# Patient Record
Sex: Female | Born: 1970 | Race: White | Hispanic: No | Marital: Married | Smoking: Former smoker
Health system: Southern US, Community
[De-identification: ages and names within clinical notes are randomized; demographics above are authoritative.]

## PROBLEM LIST (undated history)

## (undated) DIAGNOSIS — R011 Cardiac murmur, unspecified: Secondary | ICD-10-CM

## (undated) DIAGNOSIS — R519 Headache, unspecified: Secondary | ICD-10-CM

## (undated) DIAGNOSIS — F411 Generalized anxiety disorder: Secondary | ICD-10-CM

## (undated) DIAGNOSIS — K5790 Diverticulosis of intestine, part unspecified, without perforation or abscess without bleeding: Secondary | ICD-10-CM

## (undated) DIAGNOSIS — G43909 Migraine, unspecified, not intractable, without status migrainosus: Secondary | ICD-10-CM

## (undated) DIAGNOSIS — F32A Depression, unspecified: Secondary | ICD-10-CM

## (undated) DIAGNOSIS — K219 Gastro-esophageal reflux disease without esophagitis: Secondary | ICD-10-CM

## (undated) DIAGNOSIS — I509 Heart failure, unspecified: Secondary | ICD-10-CM

## (undated) DIAGNOSIS — K635 Polyp of colon: Secondary | ICD-10-CM

## (undated) DIAGNOSIS — R51 Headache: Secondary | ICD-10-CM

## (undated) DIAGNOSIS — F329 Major depressive disorder, single episode, unspecified: Secondary | ICD-10-CM

## (undated) DIAGNOSIS — T7840XA Allergy, unspecified, initial encounter: Secondary | ICD-10-CM

## (undated) DIAGNOSIS — K8020 Calculus of gallbladder without cholecystitis without obstruction: Secondary | ICD-10-CM

## (undated) DIAGNOSIS — G8929 Other chronic pain: Secondary | ICD-10-CM

## (undated) DIAGNOSIS — E669 Obesity, unspecified: Secondary | ICD-10-CM

## (undated) DIAGNOSIS — E785 Hyperlipidemia, unspecified: Secondary | ICD-10-CM

## (undated) DIAGNOSIS — D649 Anemia, unspecified: Secondary | ICD-10-CM

## (undated) DIAGNOSIS — M1990 Unspecified osteoarthritis, unspecified site: Secondary | ICD-10-CM

## (undated) HISTORY — PX: COLONOSCOPY: SHX174

## (undated) HISTORY — DX: Headache, unspecified: G89.29

## (undated) HISTORY — DX: Cardiac murmur, unspecified: R01.1

## (undated) HISTORY — DX: Gastro-esophageal reflux disease without esophagitis: K21.9

## (undated) HISTORY — DX: Generalized anxiety disorder: F41.1

## (undated) HISTORY — DX: Polyp of colon: K63.5

## (undated) HISTORY — DX: Anemia, unspecified: D64.9

## (undated) HISTORY — DX: Headache: R51

## (undated) HISTORY — DX: Diverticulosis of intestine, part unspecified, without perforation or abscess without bleeding: K57.90

## (undated) HISTORY — DX: Obesity, unspecified: E66.9

## (undated) HISTORY — DX: Major depressive disorder, single episode, unspecified: F32.9

## (undated) HISTORY — DX: Calculus of gallbladder without cholecystitis without obstruction: K80.20

## (undated) HISTORY — DX: Hyperlipidemia, unspecified: E78.5

## (undated) HISTORY — DX: Allergy, unspecified, initial encounter: T78.40XA

## (undated) HISTORY — DX: Depression, unspecified: F32.A

## (undated) HISTORY — DX: Headache, unspecified: R51.9

## (undated) HISTORY — DX: Unspecified osteoarthritis, unspecified site: M19.90

## (undated) HISTORY — PX: POLYPECTOMY: SHX149

---

## 1990-12-11 HISTORY — PX: CHOLECYSTECTOMY: SHX55

## 1991-12-12 HISTORY — PX: RHINOPLASTY: SUR1284

## 1993-12-11 HISTORY — PX: TUBAL LIGATION: SHX77

## 2003-08-22 ENCOUNTER — Emergency Department (HOSPITAL_COMMUNITY): Admission: EM | Admit: 2003-08-22 | Discharge: 2003-08-22 | Payer: Self-pay | Admitting: *Deleted

## 2004-09-14 ENCOUNTER — Emergency Department (HOSPITAL_COMMUNITY): Admission: EM | Admit: 2004-09-14 | Discharge: 2004-09-14 | Payer: Self-pay | Admitting: Emergency Medicine

## 2005-03-07 ENCOUNTER — Ambulatory Visit: Admission: RE | Admit: 2005-03-07 | Discharge: 2005-03-07 | Payer: Self-pay | Admitting: Family Medicine

## 2005-03-10 ENCOUNTER — Ambulatory Visit: Payer: Self-pay | Admitting: Pulmonary Disease

## 2006-12-27 ENCOUNTER — Emergency Department (HOSPITAL_COMMUNITY): Admission: EM | Admit: 2006-12-27 | Discharge: 2006-12-27 | Payer: Self-pay | Admitting: Emergency Medicine

## 2007-04-15 ENCOUNTER — Emergency Department (HOSPITAL_COMMUNITY): Admission: EM | Admit: 2007-04-15 | Discharge: 2007-04-15 | Payer: Self-pay | Admitting: Emergency Medicine

## 2007-07-15 ENCOUNTER — Emergency Department (HOSPITAL_COMMUNITY): Admission: EM | Admit: 2007-07-15 | Discharge: 2007-07-15 | Payer: Self-pay | Admitting: Emergency Medicine

## 2007-12-24 ENCOUNTER — Emergency Department (HOSPITAL_COMMUNITY): Admission: EM | Admit: 2007-12-24 | Discharge: 2007-12-25 | Payer: Self-pay | Admitting: Emergency Medicine

## 2011-02-20 ENCOUNTER — Encounter (INDEPENDENT_AMBULATORY_CARE_PROVIDER_SITE_OTHER): Payer: Self-pay | Admitting: *Deleted

## 2011-02-22 ENCOUNTER — Encounter: Payer: Self-pay | Admitting: Gastroenterology

## 2011-02-22 ENCOUNTER — Ambulatory Visit (INDEPENDENT_AMBULATORY_CARE_PROVIDER_SITE_OTHER): Payer: 59 | Admitting: Gastroenterology

## 2011-02-22 ENCOUNTER — Encounter (INDEPENDENT_AMBULATORY_CARE_PROVIDER_SITE_OTHER): Payer: Self-pay | Admitting: *Deleted

## 2011-02-22 DIAGNOSIS — K5900 Constipation, unspecified: Secondary | ICD-10-CM

## 2011-02-25 ENCOUNTER — Encounter: Payer: Self-pay | Admitting: Nurse Practitioner

## 2011-02-25 DIAGNOSIS — I10 Essential (primary) hypertension: Secondary | ICD-10-CM | POA: Insufficient documentation

## 2011-02-25 DIAGNOSIS — F32A Depression, unspecified: Secondary | ICD-10-CM | POA: Insufficient documentation

## 2011-02-25 DIAGNOSIS — F411 Generalized anxiety disorder: Secondary | ICD-10-CM

## 2011-02-25 DIAGNOSIS — E1165 Type 2 diabetes mellitus with hyperglycemia: Secondary | ICD-10-CM | POA: Insufficient documentation

## 2011-02-25 DIAGNOSIS — E785 Hyperlipidemia, unspecified: Secondary | ICD-10-CM

## 2011-02-25 DIAGNOSIS — F329 Major depressive disorder, single episode, unspecified: Secondary | ICD-10-CM

## 2011-02-28 NOTE — Letter (Signed)
Summary: New Patient letter  [facility]  [address] [place], [place] [address]   Phone: [phone]  Fax: [phone]       02/20/2011 MRN: [id number]  [facility] [address] [place], [place]  [address]  Dear Ms. [facility],  Welcome to the Gastroenterology Division at [facility].    You are scheduled to see Dr. Rob Bunting on February 22, 2011 at 9:00am on the 3rd floor at [facility], [address]  We ask that you try to arrive at our office 15 minutes prior to your appointment time to allow for check-in.  We would like you to complete the enclosed self-administered evaluation form prior to your visit and bring it with you on the day of your appointment.  We will review it with you.  Also, please bring a complete list of all your medications or, if you prefer, bring the medication bottles and we will list them.  Please bring your insurance card so that we may make a copy of it.  If your insurance requires a referral to see a specialist, please bring your referral form from your primary care physician.  Co-payments are due at the time of your visit and may be paid by cash, check or credit card.     Your office visit will consist of a consult with your physician (includes a physical exam), any laboratory testing he/she may order, scheduling of any necessary diagnostic testing (e.g. x-ray, ultrasound, CT-scan), and scheduling of a procedure (e.g. Endoscopy, Colonoscopy) if required.  Please allow enough time on your schedule to allow for any/all of these possibilities.    If you cannot keep your appointment, please call [phone] to cancel or reschedule prior to your appointment date.  This allows [place] the opportunity to schedule an appointment for another patient in need of care.  If you do not cancel or reschedule by 5 p.m. the business day prior to your appointment date, you will be charged a $50.00 late cancellation/no-show fee.     Thank you for choosing [facility] for your medical needs.  We appreciate the opportunity to care for you.  Please visit [place] at our website  to learn more about our practice.                     Sincerely,                                                             The Gastroenterology Division

## 2011-02-28 NOTE — Assessment & Plan Note (Signed)
History of Present Illness Visit Type: Initial Consult Primary GI MD: Rob Bunting MD Primary Provider: Bennie Pierini, NP  Requesting Provider: Ernestina Penna, MD  Chief Complaint: constipation History of Present Illness:      a very pleasant 40 year old woman who has had a lot of contipation for past 6 months.  She had her GB removed in 1990, usually has been pretty loose.  Was going every day, very easily, sometimes twice a day.  Started victoza 8 months ago.   side effects of vicotza  include nausea, diarrhea, vomiting, constipation.  she never sees blood in her stool  she has lost weight recently, about 40 pounds in past year.  she started taking miralax, 1 cap a day, for past 2 weeks.  She says this has clearly helped her constipation.   blood tests done last month show that she  head is normal complete metabolic profile,             Current Medications (verified): 1)  Wellbutrin Xl 300 Mg Xr24h-Tab (Bupropion Hcl) .Marland Kitchen.. 1 By Mouth Once Daily 2)  Clonazepam 0.5 Mg Tabs (Clonazepam) .Marland Kitchen.. 1 By Mouth Once Daily 3)  Pravachol 40 Mg Tabs (Pravastatin Sodium) .Marland Kitchen.. 1 By Mouth Once Daily 4)  Vitamin D (Ergocalciferol) 50000 Unit Caps (Ergocalciferol) .Marland Kitchen.. 1 By Mouth Q Week 5)  Victoza 1.2 .... Injection Once Daily For Diabetes 6)  Miralax  Powd (Polyethylene Glycol 3350) .... Once Daily 7)  Ibuprofen 200 Mg Caps (Ibuprofen) .... 2 Capsule By Mouth Two Times A Day For Headaches 8)  Fioricet 50-325-40 Mg Tabs (Butalbital-Apap-Caffeine) .... As Needed For Headaches 9)  Imitrex 25 Mg Tabs (Sumatriptan Succinate) .... As Needed  Allergies (verified): 1)  ! Jonne Ply  Past History:  Past Medical History: Depression Diabetes Hyperlipidemia Hypertension Chronic Headaches  obesity  Gallstones,   Past Surgical History:  gallbladder removal 1991  Family History:  no colon cancer  Social History:  she is married, she has 2 children, she works as a Chief Operating Officer, she currently smokes cigarettes, she does not drink alcohol.  Review of Systems       Pertinent positive and negative review of systems were noted in the above HPI and GI specific review of systems.  All other review of systems was otherwise negative.   Vital Signs:  Patient profile:   40 year old female Height:      65 inches Weight:      295 pounds BMI:     49.27 BSA:     2.34 Pulse rate:   88 / minute Pulse rhythm:   regular BP sitting:   128 / 74  (left arm) Cuff size:   large  Vitals Entered By: Ok Anis CMA (February 22, 2011 9:12 AM)  Physical Exam  Additional Exam:  Constitutional:  Obese, otherwise generally well appearing Psychiatric: alert and oriented times 3 Eyes: extraocular movements intact Mouth: oropharynx moist, no lesions Neck: supple, no lymphadenopathy Cardiovascular: heart regular rate and rythm Lungs: CTA bilaterally Abdomen: soft, non-tender, non-distended, no obvious ascites, no peritoneal signs, normal bowel sounds Extremities: no lower extremity edema bilaterally Skin: no lesions on visible extremities    Impression & Recommendations:  Problem # 1:   change in bowel habits, constipation  she was started on a medicine that list constipation as a  potential side effect shortly before her constipation began. I suspect that this medicine is possibly contributing to her constipation. MiraLax once daily has significantly improved  matters. We will proceed with colonoscopy at her since convenience to rule out structural causes and she will continue on MiraLax daily  Patient Instructions: 1)  One of your biggest health concerns is your smoking.  You should try your absolute best to stop.  If you need assistence, please contact your PCP or Smoking Cessation Class at [facility] [phone]) or [facility] Quit-Line (1-800-QUIT-NOW). 2)  Stay on miralax, one or two scoops a day. 3)  You will be scheduled to have a colonoscopy. 4)  Seems  like the victoza might be contributing to your constipation. 5)  A copy of this information will be sent to Paulene Floor, NP 6)  The medication list was reviewed and reconciled.  All changed / newly prescribed medications were explained.  A complete medication list was provided to the patient / caregiver. 7)  The medication list was reviewed and reconciled.  All changed / newly prescribed medications were explained.  A complete medication list was provided to the patient / caregiver.  Appended Document: Orders Update/movi    Clinical Lists Changes  Problems: Added new problem of CONSTIPATION (ICD-564.00) Medications: Added new medication of MOVIPREP 100 GM  SOLR (PEG-KCL-NACL-NASULF-NA ASC-C) As per prep instructions. - Signed Rx of MOVIPREP 100 GM  SOLR (PEG-KCL-NACL-NASULF-NA ASC-C) As per prep instructions.;  #1 x 0;  Signed;  Entered by: Chales Abrahams CMA (AAMA);  Authorized by: Rachael Fee MD;  Method used: Electronically to [facility]  [id number] [address] [place] [address], [place], [place]  [address], Ph: [phone] or [phone], Fax: [phone] Orders: Added new Test order of Colonoscopy (Colon) - Signed    Prescriptions: MOVIPREP 100 GM  SOLR (PEG-KCL-NACL-NASULF-NA ASC-C) As per prep instructions.  #1 x 0   Entered by:   Chales Abrahams CMA (AAMA)   Authorized by:   Rachael Fee MD   Signed by:   Chales Abrahams CMA (AAMA) on 02/22/2011   Method used:   Electronically to        [facility]  [id number] (retail)       [address]       [place], [place]  [address]       Ph: [phone] or [phone]       Fax: [phone]   RxID:   [id number]

## 2011-02-28 NOTE — Letter (Signed)
Summary: Diabetic Instructions  [facility]  [address] [place], [place] [address]   Phone: [phone]  Fax: [phone]    [name] 07/23/1971 MRN: [id number]   X   Victoza  DIABETIC MEDICATION INSTRUCTIONS  The day before your procedure:   Take your Victoza as you do normally  The day of your procedure:   Do not take your Victoza   We will check your blood sugar levels during the admission process and again in Recovery before discharging you home  ________________________________________________________________________

## 2011-02-28 NOTE — Letter (Signed)
Summary: [facility] Instructions  [facility]  [address] [place], [place] [address]   Phone: [phone]  Fax: [phone]       Kelli [name]    12-Nov-1971    MRN: [id number]        Procedure Day /Date:03/28/11 TUE     Arrival Time:2 pm     Procedure Time:3 pm     Location of Procedure:                    [facility] (4th Floor)   PREPARATION FOR COLONOSCOPY WITH MOVIPREP   Starting 5 days prior to your procedure4/12/12 do not eat nuts, seeds, popcorn, corn, beans, peas,  salads, or any raw vegetables.  Do not take any fiber supplements (e.g. Metamucil, Citrucel, and Benefiber).  THE DAY BEFORE YOUR PROCEDURE         DATE:03/27/11  DAY: MON  1.  Drink clear liquids the entire day-NO SOLID FOOD  2.  Do not drink anything colored red or purple.  Avoid juices with pulp.  No orange juice.  3.  Drink at least 64 oz. (8 glasses) of fluid/clear liquids during the day to prevent dehydration and help the prep work efficiently.  CLEAR LIQUIDS INCLUDE: Water Jello Ice Popsicles Tea (sugar ok, no milk/cream) Powdered fruit flavored drinks Coffee (sugar ok, no milk/cream) Gatorade Juice: apple, white grape, white cranberry  Lemonade Clear bullion, consomm, broth Carbonated beverages (any kind) Strained chicken noodle soup Hard Candy                             4.  In the morning, mix first dose of MoviPrep solution:    Empty 1 Pouch A and 1 Pouch B into the disposable container    Add lukewarm drinking water to the top line of the container. Mix to dissolve    Refrigerate (mixed solution should be used within 24 hrs)  5.  Begin drinking the prep at 5:00 p.m. The MoviPrep container is divided by 4 marks.   Every 15 minutes drink the solution down to the next mark (approximately 8 oz) until the full liter is complete.   6.  Follow completed prep with 16 oz of clear liquid of your choice (Nothing red or purple).  Continue to drink clear  liquids until bedtime.  7.  Before going to bed, mix second dose of MoviPrep solution:    Empty 1 Pouch A and 1 Pouch B into the disposable container    Add lukewarm drinking water to the top line of the container. Mix to dissolve    Refrigerate  THE DAY OF YOUR PROCEDURE      DATE: 03/28/11 DAY: TUE  Beginning at 10 a.m. (5 hours before procedure):         1. Every 15 minutes, drink the solution down to the next mark (approx 8 oz) until the full liter is complete.  2. Follow completed prep with 16 oz. of clear liquid of your choice.    3. You may drink clear liquids until 1 pm  (2 HOURS BEFORE PROCEDURE).   MEDICATION INSTRUCTIONS  Unless otherwise instructed, you should take regular prescription medications with a small sip of water   as early as possible the morning of your procedure.  Diabetic patients - see separate instructions.           OTHER INSTRUCTIONS  You will need a responsible  adult at least 40 years of age to accompany you and drive you home.   This person must remain in the waiting room during your procedure.  Wear loose fitting clothing that is easily removed.  Leave jewelry and other valuables at home.  However, you may wish to bring a book to read or  an iPod/MP3 player to listen to music as you wait for your procedure to start.  Remove all body piercing jewelry and leave at home.  Total time from sign-in until discharge is approximately 2-3 hours.  You should go home directly after your procedure and rest.  You can resume normal activities the  day after your procedure.  The day of your procedure you should not:   Drive   Make legal decisions   Operate machinery   Drink alcohol   Return to work  You will receive specific instructions about eating, activities and medications before you leave.    The above instructions have been reviewed and explained to me by   _______________________    I fully understand and can verbalize  these instructions _____________________________ Date _________

## 2011-03-27 ENCOUNTER — Encounter: Payer: Self-pay | Admitting: Gastroenterology

## 2011-03-28 ENCOUNTER — Encounter: Payer: Self-pay | Admitting: Gastroenterology

## 2011-03-28 ENCOUNTER — Ambulatory Visit (AMBULATORY_SURGERY_CENTER): Payer: 59 | Admitting: Gastroenterology

## 2011-03-28 VITALS — BP 119/69 | HR 74 | Temp 96.8°F | Resp 29 | Ht 65.0 in | Wt 292.0 lb

## 2011-03-28 DIAGNOSIS — D126 Benign neoplasm of colon, unspecified: Secondary | ICD-10-CM

## 2011-03-28 DIAGNOSIS — K5730 Diverticulosis of large intestine without perforation or abscess without bleeding: Secondary | ICD-10-CM

## 2011-03-28 DIAGNOSIS — K635 Polyp of colon: Secondary | ICD-10-CM

## 2011-03-28 DIAGNOSIS — K5900 Constipation, unspecified: Secondary | ICD-10-CM

## 2011-03-28 HISTORY — DX: Polyp of colon: K63.5

## 2011-03-28 LAB — GLUCOSE, CAPILLARY: Glucose-Capillary: 161 mg/dL — ABNORMAL HIGH (ref 70–99)

## 2011-03-28 MED ORDER — SODIUM CHLORIDE 0.9 % IV SOLN: 500.0000 mL | INTRAVENOUS | Status: DC

## 2011-03-28 NOTE — Patient Instructions (Signed)
1 polyp  Mild diverticulosis See green and blue sheets for additional d/c instructions

## 2011-03-29 ENCOUNTER — Telehealth: Payer: Self-pay | Admitting: *Deleted

## 2011-03-29 NOTE — Telephone Encounter (Signed)
Home number had message that stated number not reachable and cell nu not in service

## 2011-04-25 ENCOUNTER — Encounter: Payer: Self-pay | Admitting: Family Medicine

## 2011-04-26 ENCOUNTER — Encounter: Payer: Self-pay | Admitting: Family Medicine

## 2011-04-28 NOTE — Procedures (Signed)
Kelli [name], Kelli [name]            ACCOUNT NO.:  [id number]   MEDICAL RECORD NO.:  [id number]          PATIENT TYPE:  OUT   LOCATION:  SLEEP LAB                     FACILITY:  [facility]   PHYSICIAN:  Marcelyn Bruins, M.D. [facility] DATE OF BIRTH:  08-27-1971   DATE OF STUDY:  03/07/2005                              NOCTURNAL POLYSOMNOGRAM   REFERRING PHYSICIAN:  Dr. Lilyan Punt.   INDICATIONS FOR THE STUDY:  Hypersomnia with sleep apnea.   SLEEP ARCHITECTURE:  The patient had a total sleep time of 341 minutes with  a sleep efficiency of 89%. There was decreased slow wave sleep but adequate  REM. Sleep onset latency was mildly prolonged at 36 minutes and REM onset  was normal at 109 minutes.   IMPRESSION:  1.  Mild obstructive sleep apnea/hypopnea syndrome with a respiratory      disturbance index of 10 events per hour and oxygen desaturation as low      as 83%. The events were not positional. Even though the patient's      respiratory disturbance index was not that high, the events occurred      primarily during REM and therefore may result in greater symptoms than      usually is seen in those with mild obstructive sleep apnea. Clinical      correlation is suggested. The patient did not meet split night criteria      due to most of her events occurring after midnight.  2.  Mild to moderate snoring noted throughout.  3.  No clinically significant cardiac arrhythmias.      KC/MEDQ  D:  03/10/2005 09:27:47  T:  03/10/2005 09:47:25  Job:  [id number]

## 2011-08-30 LAB — GC/CHLAMYDIA PROBE AMP, GENITAL
Chlamydia, DNA Probe: NEGATIVE
GC Probe Amp, Genital: NEGATIVE

## 2011-08-30 LAB — URINALYSIS, ROUTINE W REFLEX MICROSCOPIC
Glucose, UA: NEGATIVE
Hgb urine dipstick: NEGATIVE
Specific Gravity, Urine: >1.03 — ABNORMAL HIGH
pH: 5

## 2011-08-30 LAB — WET PREP, GENITAL: Yeast Wet Prep HPF POC: NONE SEEN

## 2013-06-04 ENCOUNTER — Encounter: Payer: Self-pay | Admitting: Obstetrics & Gynecology

## 2013-06-05 ENCOUNTER — Ambulatory Visit (INDEPENDENT_AMBULATORY_CARE_PROVIDER_SITE_OTHER): Payer: 59 | Admitting: Obstetrics & Gynecology

## 2013-06-05 ENCOUNTER — Encounter: Payer: Self-pay | Admitting: Obstetrics & Gynecology

## 2013-06-05 VITALS — BP 122/78 | HR 60 | Resp 16 | Ht 64.75 in | Wt 279.8 lb

## 2013-06-05 DIAGNOSIS — Z01419 Encounter for gynecological examination (general) (routine) without abnormal findings: Secondary | ICD-10-CM

## 2013-06-05 MED ORDER — NORETHIN-ETH ESTRAD-FE BIPHAS 1 MG-10 MCG / 10 MCG PO TABS: 1.0000 | ORAL_TABLET | Freq: Every day | ORAL | Status: DC

## 2013-06-05 NOTE — Patient Instructions (Signed)

## 2013-06-05 NOTE — Progress Notes (Addendum)
42 y.o. G3P2 MarriedCaucasianF here for annual exam.  Doing well.  Has "new" 100 month old granddaughter--Kelli [name].  Cycles are regular and some better.  Decided against the Mirena.  Quit smoking in February.  Would like to try pills now that she is not smoking.  Labs in April were all good.  Hb A1c was 6.7 per patient.  Patient's last menstrual period was 05/25/2013.          Sexually active: yes  The current method of family planning is tubal ligation.    Exercising: no  not regularly Smoker:  No quit in 2/14  Health Maintenance: Pap:  05/15/12 WNL/negative HR HPV History of abnormal Pap:  yes MMG:  [facility].  (got outside records--MMG was 6/12) Colonoscopy:  2012 repeat in 5 years BMD:   none TDaP:  05/15/12 Screening Labs: PCP, Hb today: PCP, Urine today: PCP   reports that she has quit smoking. She uses smokeless tobacco. She reports that  drinks alcohol. She reports that she does not use illicit drugs.  Past Medical History  Diagnosis Date  . Depression   . Diabetes mellitus   . Hyperlipidemia   . Hypertension   . Chronic headache   . Obesity   . Gallstones   . GAD (generalized anxiety disorder)   . Diverticulosis   . Colon polyps 03/28/11    Past Surgical History  Procedure Laterality Date  . Cholecystectomy  1992  . Rhinoplasty  1993  . Cesarean section  1991, 1995  . Tubal ligation Bilateral 1995    Current Outpatient Prescriptions  Medication Sig Dispense Refill  . atorvastatin (LIPITOR) 40 MG tablet 40 mg daily.      Marland Kitchen buPROPion (WELLBUTRIN XL) 300 MG 24 hr tablet Take 300 mg by mouth daily.        . Cholecalciferol (VITAMIN D PO) Take by mouth daily.      . clonazePAM (KLONOPIN) 0.5 MG tablet Take 0.5 mg by mouth daily.        . cyclobenzaprine (FLEXERIL) 5 MG tablet 5 mg daily.      Marland Kitchen ibuprofen (ADVIL,MOTRIN) 200 MG tablet Take 800 mg by mouth as needed.       . Liraglutide (VICTOZA) 18 MG/3ML SOLN Inject 1.2 mg into the skin daily.        .  Loratadine-Pseudoephedrine (CLARITIN-D 24 HOUR PO) Take by mouth.      . Omega-3 Fatty Acids (FISH OIL PO) Take by mouth.      . polyethylene glycol (MIRALAX / GLYCOLAX) packet Take 17 g by mouth daily.        . SUMAtriptan (IMITREX) 25 MG tablet Take 25 mg by mouth as needed.        . Topiramate (TOPAMAX PO) Take 50 mg by mouth 2 (two) times daily.        No current facility-administered medications for this visit.    Family History  Problem Relation Age of Onset  . Diabetes Maternal Grandmother     ROS:  Pertinent items are noted in HPI.  Otherwise, a comprehensive ROS was negative.  Exam:   BP 122/78  Pulse 60  Resp 16  Ht 5' 4.75" (1.645 m)  Wt 279 lb 12.8 oz (126.916 kg)  BMI 46.9 kg/m2  LMP 05/25/2013  Weight change: +19lb   Height: 5' 4.75" (164.5 cm)  Ht Readings from Last 3 Encounters:  06/05/13 5' 4.75" (1.645 m)  03/28/11 5\' 5"  (1.651 m)  03/27/11 5\' 5"  (1.651  m)   General appearance: alert, cooperative and appears stated age Head: Normocephalic, without obvious abnormality, atraumatic Neck: no adenopathy, supple, symmetrical, trachea midline and thyroid normal to inspection and palpation Lungs: clear to auscultation bilaterally Breasts: normal appearance, no masses or tenderness Heart: regular rate and rhythm Abdomen: soft, non-tender; bowel sounds normal; no masses,  no organomegaly Extremities: extremities normal, atraumatic, no cyanosis or edema Skin: Skin color, texture, turgor normal. No rashes or lesions Lymph nodes: Cervical, supraclavicular, and axillary nodes normal. No abnormal inguinal nodes palpated Neurologic: Grossly normal   Pelvic: External genitalia:  no lesions              Urethra:  normal appearing urethra with no masses, tenderness or lesions              Bartholins and Skenes: normal                 Vagina: normal appearing vagina with normal color and discharge, no lesions, inferior edge of left lower vulvar with healing furuncle               Cervix: no lesions              Pap taken: no Bimanual Exam:  Uterus:  normal size, contour, position, consistency, mobility, non-tender              Adnexa: normal adnexa and no mass, fullness, tenderness               Rectovaginal: Confirms               Anus:  normal sphincter tone, no lesions  A:  Well Woman with normal exam Menorrhagia with neg u/s and endometrial biopsy 6/11 H/O cesarean section x 2 with BTL Diabetes Morbid obesity  P:   Mammogram yearly.  Will get release to get MMG from [facility]. pap smear with neg HR HPV one year ago.  No pap today. Labs every 3 months at [facility] medicine Trial of Lo lo estrin.  Risks of elevated BP, headache, DVT/PE, stroke, DUB, Nausea, Headache all discussed with patient. return annually or prn  An After Visit Summary was printed and given to the patient.

## 2013-06-12 ENCOUNTER — Telehealth: Payer: Self-pay

## 2013-06-12 NOTE — Telephone Encounter (Signed)
Message copied by Elisha Headland on Thu Jun 12, 2013  4:43 PM ------      Message from: Jerene Bears      Created: Mon Jun 09, 2013  9:45 PM      Regarding: mmg from [facility],      This is a patient we recently saw for AEX.  I got her MMG from [facility] and it is from 6/12.  Can you please call patient and inform her of this and the importance of have an up to date MMG?  Thanks.            MSM ------

## 2013-06-12 NOTE — Telephone Encounter (Signed)
Lmtcb//kn 

## 2013-06-25 NOTE — Telephone Encounter (Signed)
7/16 lmtcb//kn 

## 2013-07-07 NOTE — Telephone Encounter (Signed)
7/28 lmtcb/kn 

## 2013-09-05 ENCOUNTER — Ambulatory Visit: Payer: 59 | Admitting: Obstetrics & Gynecology

## 2013-09-29 ENCOUNTER — Other Ambulatory Visit: Payer: Self-pay

## 2013-09-29 DIAGNOSIS — Z1231 Encounter for screening mammogram for malignant neoplasm of breast: Secondary | ICD-10-CM

## 2013-10-15 ENCOUNTER — Ambulatory Visit
Admission: RE | Admit: 2013-10-15 | Discharge: 2013-10-15 | Disposition: A | Discharge status: Home / Self Care | Payer: 59 | Source: Ambulatory Visit

## 2013-10-15 DIAGNOSIS — Z1231 Encounter for screening mammogram for malignant neoplasm of breast: Secondary | ICD-10-CM

## 2014-05-28 NOTE — Telephone Encounter (Signed)
Patient has had her MMG done.//kn

## 2014-08-07 ENCOUNTER — Ambulatory Visit (INDEPENDENT_AMBULATORY_CARE_PROVIDER_SITE_OTHER): Payer: 59 | Admitting: Obstetrics & Gynecology

## 2014-08-07 ENCOUNTER — Encounter: Payer: Self-pay | Admitting: Obstetrics & Gynecology

## 2014-08-07 VITALS — BP 118/80 | HR 68 | Resp 18 | Ht 64.5 in | Wt 264.0 lb

## 2014-08-07 DIAGNOSIS — Z124 Encounter for screening for malignant neoplasm of cervix: Secondary | ICD-10-CM

## 2014-08-07 DIAGNOSIS — E1110 Type 2 diabetes mellitus with ketoacidosis without coma: Secondary | ICD-10-CM

## 2014-08-07 DIAGNOSIS — E1310 Other specified diabetes mellitus with ketoacidosis without coma: Secondary | ICD-10-CM

## 2014-08-07 DIAGNOSIS — Z0000 Encounter for general adult medical examination without abnormal findings: Secondary | ICD-10-CM

## 2014-08-07 DIAGNOSIS — D500 Iron deficiency anemia secondary to blood loss (chronic): Secondary | ICD-10-CM

## 2014-08-07 DIAGNOSIS — Z01419 Encounter for gynecological examination (general) (routine) without abnormal findings: Secondary | ICD-10-CM

## 2014-08-07 LAB — POCT URINALYSIS DIPSTICK
BILIRUBIN UA: NEGATIVE
Blood, UA: NEGATIVE
Glucose, UA: 100
Ketones, UA: NEGATIVE
LEUKOCYTES UA: NEGATIVE
NITRITE UA: NEGATIVE
PH UA: 7
PROTEIN UA: NEGATIVE
UROBILINOGEN UA: NEGATIVE

## 2014-08-07 LAB — IRON: IRON: 67 ug/dL (ref 42–145)

## 2014-08-07 LAB — FERRITIN: FERRITIN: 132 ng/mL (ref 10–291)

## 2014-08-07 LAB — COMPREHENSIVE METABOLIC PANEL
ALK PHOS: 106 U/L (ref 39–117)
ALT: 13 U/L (ref 0–35)
AST: 11 U/L (ref 0–37)
Albumin: 4 g/dL (ref 3.5–5.2)
BILIRUBIN TOTAL: 0.5 mg/dL (ref 0.2–1.2)
BUN: 11 mg/dL (ref 6–23)
CO2: 29 mEq/L (ref 19–32)
CREATININE: 0.63 mg/dL (ref 0.50–1.10)
Calcium: 9.4 mg/dL (ref 8.4–10.5)
Chloride: 103 mEq/L (ref 96–112)
Glucose, Bld: 169 mg/dL — ABNORMAL HIGH (ref 70–99)
POTASSIUM: 4.2 meq/L (ref 3.5–5.3)
Sodium: 139 mEq/L (ref 135–145)
Total Protein: 6.4 g/dL (ref 6.0–8.3)

## 2014-08-07 LAB — LIPID PANEL
CHOLESTEROL: 122 mg/dL (ref 0–200)
HDL: 50 mg/dL (ref >39)
LDL Cholesterol: 59 mg/dL (ref 0–99)
TRIGLYCERIDES: 66 mg/dL (ref <150)
Total CHOL/HDL Ratio: 2.4 Ratio
VLDL: 13 mg/dL (ref 0–40)

## 2014-08-07 LAB — HEMOGLOBIN, FINGERSTICK: Hemoglobin, fingerstick: 10.8 g/dL — ABNORMAL LOW (ref 12.0–16.0)

## 2014-08-07 LAB — HEMOGLOBIN A1C
Hgb A1c MFr Bld: 8.4 % — ABNORMAL HIGH (ref <5.7)
MEAN PLASMA GLUCOSE: 194 mg/dL — AB (ref <117)

## 2014-08-07 NOTE — Progress Notes (Signed)
43 y.o. G3P2 MarriedCaucasianF here for annual exam.  Reports cycles are more normal.  Off pill as she was concerned about weight gain.  Flow is about 5 days with first two being the heavier days.  Cycles were more like 7 days with heavy flow for 4-5 days.    Smoking again.  Knows I don't want her to be on an estrogen pill if she is smoking.   Mother injured 01/10/14.  Pt left job to be able to help mother.  Didn't give sufficient notice to leave job and now states they will not rehire her due to this.  Working again but not a job she loves.  Still grateful she is working again.     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    Patient's last menstrual period was 08/01/2014.          Sexually active: Yes.    The current method of family planning is tubal ligation.    Exercising: Yes.    Walking 3x weekly Smoker:  Yes, <1 pack/day  Health Maintenance: Pap:  05/2012 Neg. HR HPV neg History of abnormal Pap:  no MMG: 10/2013 BIRADS1: Neg Colonoscopy: 2012 - Polyps - Repeat in 5 years  BMD:   N/A TDaP:  05/2012 Screening Labs: today, Hb today: 10.8, Urine today: Glucose=100   reports that she has quit smoking. She uses smokeless tobacco. She reports that she does not drink alcohol or use illicit drugs.  Past Medical History  Diagnosis Date  . Depression   . Diabetes mellitus   . Hyperlipidemia   . Chronic headache   . Obesity   . Gallstones   . GAD (generalized anxiety disorder)   . Diverticulosis   . Colon polyps 03/28/11    Past Surgical History  Procedure Laterality Date  . Cholecystectomy  1992  . Rhinoplasty  1993  . Cesarean section  1991, 1995  . Tubal ligation Bilateral 1995    Current Outpatient Prescriptions  Medication Sig Dispense Refill  . ARIPiprazole (ABILIFY) 2 MG tablet Take 2 mg by mouth daily.      Marland Kitchen atorvastatin (LIPITOR) 40 MG tablet 40 mg daily.      Marland Kitchen buPROPion (WELLBUTRIN XL) 300 MG 24 hr tablet Take 300 mg by mouth daily.        .  Cholecalciferol (VITAMIN D PO) Take by mouth daily.      Marland Kitchen ibuprofen (ADVIL,MOTRIN) 200 MG tablet Take 800 mg by mouth as needed.       . Liraglutide (VICTOZA) 18 MG/3ML SOLN Inject 1.2 mg into the skin daily.        . Loratadine-Pseudoephedrine (CLARITIN-D 24 HOUR PO) Take by mouth.      . SUMAtriptan (IMITREX) 25 MG tablet Take 25 mg by mouth as needed.        . Topiramate (TOPAMAX PO) Take 50 mg by mouth 2 (two) times daily.        No current facility-administered medications for this visit.    Family History  Problem Relation Age of Onset  . Diabetes Maternal Grandmother     ROS:  Pertinent items are noted in HPI.  Otherwise, a comprehensive ROS was negative.  Exam:   BP 118/80  Pulse 68  Resp 18  Ht 5' 4.5" (1.638 m)  Wt 264 lb (119.75 kg)  BMI 44.63 kg/m2  LMP 08/01/2014  Weight change: -15#   Height: 5' 4.5" (163.8 cm)  Ht Readings from Last 3 Encounters:  08/07/14 5'  4.5" (1.638 m)  06/05/13 5' 4.75" (1.645 m)  03/28/11 5\' 5"  (1.651 m)    General appearance: alert, cooperative and appears stated age Head: Normocephalic, without obvious abnormality, atraumatic Neck: no adenopathy, supple, symmetrical, trachea midline and thyroid normal to inspection and palpation Lungs: clear to auscultation bilaterally Breasts: normal appearance, no masses or tenderness Heart: regular rate and rhythm Abdomen: soft, non-tender; bowel sounds normal; no masses,  no organomegaly Extremities: extremities normal, atraumatic, no cyanosis or edema Skin: Skin color, texture, turgor normal. No rashes or lesions Lymph nodes: Cervical, supraclavicular, and axillary nodes normal. No abnormal inguinal nodes palpated Neurologic: Grossly normal   Pelvic: External genitalia:  no lesions              Urethra:  normal appearing urethra with no masses, tenderness or lesions              Bartholins and Skenes: normal                 Vagina: normal appearing vagina with normal color and discharge,  no lesions              Cervix: no lesions              Pap taken: Yes.   Bimanual Exam:  Uterus:  normal size, contour, position, consistency, mobility, non-tender              Adnexa: normal adnexa and no mass, fullness, tenderness               Rectovaginal: Confirms               Anus:  normal sphincter tone, no lesions  A:  Well Woman with normal exam  H/O menorrhagia with neg u/s and endometrial biopsy 6/11.  Was on OCPs but stopped due to concerns about weight gain  H/O cesarean section x 2 with BTL  Diabetes  Anemia Morbid obesity    P: Mammogram yearly.  pap smear with neg HR HPV 2013.  Pap today. HbA1C.  Normally has labs every three months at [facility] been checked in about 9 months.  Was 6.8 last test. Lipids, CMP Iron studies  return annually or prn An After Visit Summary was printed and given to the patient.

## 2014-08-11 ENCOUNTER — Other Ambulatory Visit: Payer: Self-pay | Admitting: Obstetrics & Gynecology

## 2014-08-11 ENCOUNTER — Telehealth: Payer: Self-pay

## 2014-08-11 DIAGNOSIS — D500 Iron deficiency anemia secondary to blood loss (chronic): Secondary | ICD-10-CM

## 2014-08-11 NOTE — Telephone Encounter (Signed)
Lmtcb//kn 

## 2014-08-11 NOTE — Telephone Encounter (Signed)
Message copied by Robley Fries on Tue Aug 11, 2014 10:15 AM ------      Message from: Megan Salon      Created: Tue Aug 11, 2014  8:53 AM       Please inform pt Glucose is elevated.  HbA1C is 8.4.  She does need to follow up with her PCP.  Lipids fine.  Iron studies fine.  She needs to have a repeat CBC 3 months due to her anemia.  She either needs to start a MVI with iron, take some iron two or three times a week, or increase red meat intake.  Order placed. ------

## 2014-08-12 LAB — IPS PAP TEST WITH REFLEX TO HPV

## 2014-08-12 NOTE — Telephone Encounter (Signed)
Spoke with patient. Advised of results as seen below and message from [place]. Patient is agreeable and will call to schedule appointment with PCP. Patient has new insurance and will call for referral to new PCP if they do not accept it. Will call to schedule as soon as possible. Advised of importance of this follow up. Patient agreeable.  Routing to provider for final review. Patient agreeable to disposition. Will close encounter

## 2014-10-12 ENCOUNTER — Encounter: Payer: Self-pay | Admitting: Obstetrics & Gynecology

## 2014-12-08 ENCOUNTER — Encounter (HOSPITAL_COMMUNITY): Payer: Self-pay | Admitting: *Deleted

## 2014-12-08 ENCOUNTER — Emergency Department (HOSPITAL_COMMUNITY): Payer: 59

## 2014-12-08 ENCOUNTER — Emergency Department (HOSPITAL_COMMUNITY)
Admission: EM | Admit: 2014-12-08 | Discharge: 2014-12-08 | Disposition: A | Discharge status: Home / Self Care | Payer: 59 | Attending: Emergency Medicine | Admitting: Emergency Medicine

## 2014-12-08 DIAGNOSIS — Y9389 Activity, other specified: Secondary | ICD-10-CM | POA: Insufficient documentation

## 2014-12-08 DIAGNOSIS — E119 Type 2 diabetes mellitus without complications: Secondary | ICD-10-CM | POA: Insufficient documentation

## 2014-12-08 DIAGNOSIS — Z8719 Personal history of other diseases of the digestive system: Secondary | ICD-10-CM | POA: Insufficient documentation

## 2014-12-08 DIAGNOSIS — Y929 Unspecified place or not applicable: Secondary | ICD-10-CM | POA: Insufficient documentation

## 2014-12-08 DIAGNOSIS — W010XXA Fall on same level from slipping, tripping and stumbling without subsequent striking against object, initial encounter: Secondary | ICD-10-CM | POA: Insufficient documentation

## 2014-12-08 DIAGNOSIS — Y998 Other external cause status: Secondary | ICD-10-CM | POA: Insufficient documentation

## 2014-12-08 DIAGNOSIS — S80212A Abrasion, left knee, initial encounter: Secondary | ICD-10-CM | POA: Insufficient documentation

## 2014-12-08 DIAGNOSIS — Z79899 Other long term (current) drug therapy: Secondary | ICD-10-CM | POA: Insufficient documentation

## 2014-12-08 DIAGNOSIS — Z720 Tobacco use: Secondary | ICD-10-CM | POA: Insufficient documentation

## 2014-12-08 DIAGNOSIS — F329 Major depressive disorder, single episode, unspecified: Secondary | ICD-10-CM | POA: Insufficient documentation

## 2014-12-08 DIAGNOSIS — G43909 Migraine, unspecified, not intractable, without status migrainosus: Secondary | ICD-10-CM | POA: Insufficient documentation

## 2014-12-08 DIAGNOSIS — W19XXXA Unspecified fall, initial encounter: Secondary | ICD-10-CM

## 2014-12-08 DIAGNOSIS — S8392XA Sprain of unspecified site of left knee, initial encounter: Secondary | ICD-10-CM | POA: Insufficient documentation

## 2014-12-08 DIAGNOSIS — E669 Obesity, unspecified: Secondary | ICD-10-CM | POA: Insufficient documentation

## 2014-12-08 HISTORY — DX: Migraine, unspecified, not intractable, without status migrainosus: G43.909

## 2014-12-08 MED ORDER — HYDROCODONE-ACETAMINOPHEN 5-325 MG PO TABS
1.0000 | ORAL_TABLET | Freq: Once | ORAL | Status: AC
Start: 2014-12-08 — End: 2014-12-08
  Administered 2014-12-08: 1 via ORAL
  Filled 2014-12-08: qty 1

## 2014-12-08 MED ORDER — HYDROCODONE-ACETAMINOPHEN 5-325 MG PO TABS: ORAL_TABLET | ORAL | Status: DC

## 2014-12-08 MED ORDER — IBUPROFEN 800 MG PO TABS: 800.0000 mg | ORAL_TABLET | Freq: Three times a day (TID) | ORAL | Status: DC

## 2014-12-08 NOTE — Discharge Instructions (Signed)
Fall Prevention and Home Safety Falls cause injuries and can affect all age groups. It is possible to prevent falls.  HOW TO PREVENT FALLS  Wear shoes with rubber soles that do not have an opening for your toes.  Keep the inside and outside of your house well lit.  Use night lights throughout your home.  Remove clutter from floors.  Clean up floor spills.  Remove throw rugs or fasten them to the floor with carpet tape.  Do not place electrical cords across pathways.  Put grab bars by your tub, shower, and toilet. Do not use towel bars as grab bars.  Put handrails on both sides of the stairway. Fix loose handrails.  Do not climb on stools or stepladders, if possible.  Do not wax your floors.  Repair uneven or unsafe sidewalks, walkways, or stairs.  Keep items you use a lot within reach.  Be aware of pets.  Keep emergency numbers next to the telephone.  Put smoke detectors in your home and near bedrooms. Ask your doctor what other things you can do to prevent falls. Document Released: 09/23/2009 Document Revised: 05/28/2012 Document Reviewed: 02/27/2012 [facility] Patient Information 2015 [place], [place]. This information is not intended to replace advice given to you by your health care provider. Make sure you discuss any questions you have with your health care provider.  Knee Sprain A knee sprain is a tear in one of the strong, fibrous tissues that connect the bones (ligaments) in your knee. The severity of the sprain depends on how much of the ligament is torn. The tear can be either partial or complete. CAUSES  Often, sprains are a result of a fall or injury. The force of the impact causes the fibers of your ligament to stretch too much. This excess tension causes the fibers of your ligament to tear. SIGNS AND SYMPTOMS  You may have some loss of motion in your knee. Other symptoms include:  Bruising.  Pain in the knee area.  Tenderness of the knee to the  touch.  Swelling. DIAGNOSIS  To diagnose a knee sprain, your health care provider will physically examine your knee. Your health care provider may also suggest an X-ray exam of your knee to make sure no bones are broken. TREATMENT  If your ligament is only partially torn, treatment usually involves keeping the knee in a fixed position (immobilization) or bracing your knee for activities that require movement for several weeks. To do this, your health care provider will apply a bandage, cast, or splint to keep your knee from moving and to support your knee during movement until it heals. For a partially torn ligament, the healing process usually takes 4-6 weeks. If your ligament is completely torn, depending on which ligament it is, you may need surgery to reconnect the ligament to the bone or reconstruct it. After surgery, a cast or splint may be applied and will need to stay on your knee for 4-6 weeks while your ligament heals. HOME CARE INSTRUCTIONS  Keep your injured knee elevated to decrease swelling.  To ease pain and swelling, apply ice to the injured area:  Put ice in a plastic bag.  Place a towel between your skin and the bag.  Leave the ice on for 20 minutes, 2-3 times a day.  Only take medicine for pain as directed by your health care provider.  Do not leave your knee unprotected until pain and stiffness go away (usually 4-6 weeks).  If you have a cast  or splint, do not allow it to get wet. If you have been instructed not to remove it, cover it with a plastic bag when you shower or bathe. Do not swim.  Your health care provider may suggest exercises for you to do during your recovery to prevent or limit permanent weakness and stiffness. SEEK IMMEDIATE MEDICAL CARE IF:  Your cast or splint becomes damaged.  Your pain becomes worse.  You have significant pain, swelling, or numbness below the cast or splint. MAKE SURE YOU:  Understand these instructions.  Will watch your  condition.  Will get help right away if you are not doing well or get worse. Document Released: 11/27/2005 Document Revised: 09/17/2013 Document Reviewed: 07/09/2013 [facility] Patient Information 2015 [place], [place]. This information is not intended to replace advice given to you by your health care provider. Make sure you discuss any questions you have with your health care provider.

## 2014-12-08 NOTE — ED Notes (Signed)
Pt at xray

## 2014-12-08 NOTE — ED Notes (Signed)
Tammy, PA at bedside.  

## 2014-12-08 NOTE — ED Notes (Signed)
Slipped in mud and fell, pain lt leg esp the knee and  hip

## 2014-12-10 NOTE — ED Provider Notes (Signed)
CSN: [id number]     Arrival date & time 12/08/14  1154 History   First MD Initiated Contact with Patient 12/08/14 1257     Chief Complaint  Patient presents with  . Fall     (Consider location/radiation/quality/duration/timing/severity/associated sxs/prior Treatment) HPI  Kelli [name] is a 43 y.o. female who presents to the Emergency Department complaining of left knee pain that began suddenly approximately two hours ago when she slipped and fell, landing on her knee.  She reports pain to the back of her knee and just below the knee cap.  Pain is worse with bending and weight bearing and improves with rest.  She also notes having an abrasion to her knee.  She denies numbness of her leg or foot, back pain, neck pain, LOC or head injury.  She has not tried any therapies prior to ED arrival.  She reported left hip pain earlier, but states that pain has improved.   Past Medical History  Diagnosis Date  . Depression   . Diabetes mellitus   . Hyperlipidemia   . Chronic headache   . Obesity   . Gallstones   . GAD (generalized anxiety disorder)   . Diverticulosis   . Colon polyps 03/28/11  . Migraines    Past Surgical History  Procedure Laterality Date  . Cholecystectomy  1992  . Rhinoplasty  1993  . Cesarean section  1991, 1995  . Tubal ligation Bilateral 1995   Family History  Problem Relation Age of Onset  . Diabetes Maternal Grandmother    History  Substance Use Topics  . Smoking status: Current Every Day Smoker -- 1.00 packs/day for 1 years  . Smokeless tobacco: Never Used     Comment: quit 02/04/13  . Alcohol Use: No     Comment: very rare   OB History    Gravida Para Term Preterm AB TAB SAB Ectopic Multiple Living   3 2        2      Review of Systems  Constitutional: Negative for fever and chills.  Genitourinary: Negative for dysuria and difficulty urinating.  Musculoskeletal: Positive for joint swelling and arthralgias. Negative for back pain and neck pain.   Skin: Negative for color change.       abrasion left knee  Neurological: Negative for dizziness, syncope, weakness, numbness and headaches.  All other systems reviewed and are negative.     Allergies  Aspirin  Home Medications   Prior to Admission medications   Medication Sig Start Date End Date Taking? Authorizing Provider  atorvastatin (LIPITOR) 40 MG tablet Take 40 mg by mouth daily at 6 PM.  04/11/13  Yes Historical Provider, MD  buPROPion (WELLBUTRIN XL) 300 MG 24 hr tablet Take 300 mg by mouth daily.     Yes Historical Provider, MD  Cholecalciferol (VITAMIN D PO) Take 1 capsule by mouth daily.    Yes Historical Provider, MD  Liraglutide (VICTOZA) 18 MG/3ML SOLN Inject 1.2 mg into the skin daily.     Yes Historical Provider, MD  Loratadine-Pseudoephedrine (CLARITIN-D 24 HOUR PO) Take 1 tablet by mouth daily as needed (allergies).    Yes Historical Provider, MD  Multiple Vitamin (MULTIVITAMIN WITH MINERALS) TABS tablet Take 1 tablet by mouth daily.   Yes Historical Provider, MD  topiramate (TOPAMAX) 50 MG tablet Take 50 mg by mouth 2 (two) times daily.   Yes Historical Provider, MD  HYDROcodone-acetaminophen (NORCO/VICODIN) 5-325 MG per tablet Take one-two tabs po q 4-6 hrs prn pain  12/08/14   Hasnain Manheim L. Alexandera Kuntzman, PA-C  ibuprofen (ADVIL,MOTRIN) 800 MG tablet Take 1 tablet (800 mg total) by mouth 3 (three) times daily. Take with food 12/08/14   Davian Wollenberg L. Sohum Delillo, PA-C  SUMAtriptan (IMITREX) 25 MG tablet Take 25 mg by mouth as needed.      Historical Provider, MD   BP 110/58 mmHg  Pulse 70  Temp(Src) 98.4 F (36.9 C) (Oral)  Resp 20  Ht 5' 4.5" (1.638 m)  Wt 270 lb (122.471 kg)  BMI 45.65 kg/m2  SpO2 100%  LMP 11/10/2014 Physical Exam  Constitutional: She is oriented to person, place, and time. She appears well-developed and well-nourished. No distress.  HENT:  Head: Normocephalic and atraumatic.  Neck: Normal range of motion. Neck supple.  Cardiovascular: Normal rate,  regular rhythm, normal heart sounds and intact distal pulses.   No murmur heard. Pulmonary/Chest: Effort normal and breath sounds normal. No respiratory distress.  Musculoskeletal: She exhibits edema and tenderness.       Left knee: She exhibits no swelling, no effusion, no deformity and no bony tenderness. Tenderness found. No medial joint line, no lateral joint line and no patellar tendon tenderness noted.       Legs: Diffuse tenderness to palpation of the anterior left knee and popliteal fossa. Patient has full range of motion of the joint. No effusion present or step off deformity. Mild ecchymosis anteriorly. No tenderness or edema distally. Patient has full range of motion of the left hip   Neurological: She is alert and oriented to person, place, and time. She exhibits normal muscle tone. Coordination normal.  Skin: Skin is warm and dry.  Slight abrasion to the anterior left knee.  No bleeding   Nursing note and vitals reviewed.   ED Course  Procedures (including critical care time) Labs Review Labs Reviewed - No data to display  Imaging Review Dg Knee Complete 4 Views Left  12/08/2014   CLINICAL DATA:  Fall this morning in driveway with knee injury and pain. Initial encounter.  EXAM: LEFT KNEE - COMPLETE 4+ VIEW  COMPARISON:  None.  FINDINGS: No acute fracture, dislocation or joint effusion is identified. Mild medial joint space narrowing and proliferative changes seen involving the patella.  IMPRESSION: No acute injury.  Mild degenerative changes.   Electronically Signed   By: Aletta Edouard M.D.   On: 12/08/2014 13:09     EKG Interpretation None      MDM   Final diagnoses:  Fall  Knee sprain, left, initial encounter     Abrasion of the knee was cleaned and bandaged.  Knee immobilizer and crutches dispensed.  Pt agrees to elevate, ice and minimize wt bearing.  Referral for ortho, Rx for Vicodin and ibuprofen.  Appears stable for d/c   [name] L. [name] , PA-C 12/10/14  [place], DO 12/12/14 1610

## 2015-02-23 ENCOUNTER — Telehealth: Payer: Self-pay

## 2015-02-23 NOTE — Telephone Encounter (Signed)
Lmtcb//kn 

## 2015-02-24 NOTE — Telephone Encounter (Signed)
Pt returning call. Says she has seen a pcp and got her labs done.

## 2015-03-04 NOTE — Telephone Encounter (Signed)
No need for trying to get records.  As long as she is being followed, I am ok.

## 2015-03-04 NOTE — Telephone Encounter (Signed)
Dr Sabra Heck, do you want me to try to get copies of labs or just need to know that patient did follow up with PCP and had labs recheck.//kn

## 2015-09-17 ENCOUNTER — Ambulatory Visit (INDEPENDENT_AMBULATORY_CARE_PROVIDER_SITE_OTHER): Payer: Managed Care, Other (non HMO) | Admitting: Obstetrics & Gynecology

## 2015-09-17 ENCOUNTER — Encounter: Payer: Self-pay | Admitting: Obstetrics & Gynecology

## 2015-09-17 VITALS — BP 122/80 | HR 72 | Resp 14 | Ht 64.5 in | Wt 273.0 lb

## 2015-09-17 DIAGNOSIS — Z01419 Encounter for gynecological examination (general) (routine) without abnormal findings: Secondary | ICD-10-CM

## 2015-09-17 DIAGNOSIS — Z0000 Encounter for general adult medical examination without abnormal findings: Secondary | ICD-10-CM | POA: Diagnosis not present

## 2015-09-17 DIAGNOSIS — N915 Oligomenorrhea, unspecified: Secondary | ICD-10-CM

## 2015-09-17 DIAGNOSIS — N898 Other specified noninflammatory disorders of vagina: Secondary | ICD-10-CM | POA: Diagnosis not present

## 2015-09-17 LAB — CBC
HCT: 43.9 % (ref 36.0–46.0)
Hemoglobin: 14.5 g/dL (ref 12.0–15.0)
MCH: 31.3 pg (ref 26.0–34.0)
MCHC: 33 g/dL (ref 30.0–36.0)
MCV: 94.6 fL (ref 78.0–100.0)
MPV: 11.7 fL (ref 8.6–12.4)
Platelets: 194 10*3/uL (ref 150–400)
RBC: 4.64 MIL/uL (ref 3.87–5.11)
RDW: 14.2 % (ref 11.5–15.5)
WBC: 10.3 10*3/uL (ref 4.0–10.5)

## 2015-09-17 LAB — COMPREHENSIVE METABOLIC PANEL
ALT: 12 U/L (ref 6–29)
AST: 8 U/L — AB (ref 10–30)
Albumin: 4 g/dL (ref 3.6–5.1)
Alkaline Phosphatase: 99 U/L (ref 33–115)
BILIRUBIN TOTAL: 0.4 mg/dL (ref 0.2–1.2)
BUN: 17 mg/dL (ref 7–25)
CHLORIDE: 108 mmol/L (ref 98–110)
CO2: 23 mmol/L (ref 20–31)
CREATININE: 0.65 mg/dL (ref 0.50–1.10)
Calcium: 8.9 mg/dL (ref 8.6–10.2)
GLUCOSE: 227 mg/dL — AB (ref 65–99)
Potassium: 3.9 mmol/L (ref 3.5–5.3)
SODIUM: 138 mmol/L (ref 135–146)
Total Protein: 6.1 g/dL (ref 6.1–8.1)

## 2015-09-17 LAB — LIPID PANEL
Cholesterol: 143 mg/dL (ref 125–200)
HDL: 52 mg/dL (ref >=46)
LDL CALC: 64 mg/dL (ref <130)
Total CHOL/HDL Ratio: 2.8 Ratio (ref <=5.0)
Triglycerides: 137 mg/dL (ref <150)
VLDL: 27 mg/dL (ref <30)

## 2015-09-17 LAB — POCT URINALYSIS DIPSTICK
Bilirubin, UA: 1
Ketones, UA: NEGATIVE
Leukocytes, UA: NEGATIVE
Nitrite, UA: NEGATIVE
PH UA: 6
PROTEIN UA: NEGATIVE
RBC UA: NEGATIVE
UROBILINOGEN UA: NEGATIVE

## 2015-09-17 LAB — TSH: TSH: 1.524 u[IU]/mL (ref 0.350–4.500)

## 2015-09-17 NOTE — Patient Instructions (Signed)
Dr. Toy Care [address], [place], [place] [address]  [phone]  Dr. Sheralyn Boatman [address], [place], [place] [address]  [phone]

## 2015-09-17 NOTE — Progress Notes (Signed)
44 y.o. G3P2 MarriedCaucasianF here for annual exam.  Cycles are irregular.  Last was several months ago.  Flow lasted 5-7 days with that one and was heavy.    Pt reports she is newly on Abilify.  She states she feels this helps sometimes and sometimes doesn't.  Specifically, the abilify helps her with negative thoughts but she feels it makes her eat more and this makes her depression worse.  Names given to pt as she has never seen a psychiatrist.   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  Feels like she is "sitting all the time" due to work and on-line education.    Expecting second granddaughter in a few weeks.    Seeing Dr. Woody Seller for diabetes.  Last hba1c was 6.5.    Reports vaginal discharge and itching with some odor.  This started a couple of weeks ago.  Has treated this with OTC Monistat and this eases sympotms but hasn't resolved.    No LMP recorded (lmp unknown).          Sexually active: Yes.    The current method of family planning is none.    Exercising: No.  n/a Smoker:  yes  Health Maintenance: Pap:  08/07/14 normal History of abnormal Pap:  2015 MMG:  10/16/2013 dense cat b, bi-rads cat 1 neg Colonoscopy:  2012-polyps-repeat in 5 years BMD:   n/a TDaP:  05/2012 Screening Labs: , Hb today: , Urine today: glucose--trace, ph 6.0, +1 bilirubin   reports that she has been smoking.  She has never used smokeless tobacco. She reports that she does not drink alcohol or use illicit drugs.  Past Medical History  Diagnosis Date  . Depression   . Diabetes mellitus   . Hyperlipidemia   . Chronic headache   . Obesity   . Gallstones   . GAD (generalized anxiety disorder)   . Diverticulosis   . Colon polyps 03/28/11  . Migraines     Past Surgical History  Procedure Laterality Date  . Cholecystectomy  1992  . Rhinoplasty  1993  . Cesarean section  1991, 1995  . Tubal ligation Bilateral 1995    Family History  Problem Relation Age of Onset  . Diabetes Maternal Grandmother      ROS:  Pertinent items are noted in HPI.  Otherwise, a comprehensive ROS was negative.  Exam:   General appearance: alert, cooperative and appears stated age Head: Normocephalic, without obvious abnormality, atraumatic Neck: no adenopathy, supple, symmetrical, trachea midline and thyroid normal to inspection and palpation Lungs: clear to auscultation bilaterally Breasts: normal appearance, no masses or tenderness Heart: regular rate and rhythm Abdomen: soft, non-tender; bowel sounds normal; no masses,  no organomegaly Extremities: extremities normal, atraumatic, no cyanosis or edema Skin: Skin color, texture, turgor normal. No rashes or lesions Lymph nodes: Cervical, supraclavicular, and axillary nodes normal. No abnormal inguinal nodes palpated Neurologic: Grossly normal   Pelvic: External genitalia:  no lesions              Urethra:  normal appearing urethra with no masses, tenderness or lesions              Bartholins and Skenes: normal                 Vagina: normal appearing vagina with normal color and discharge, no lesions              Cervix: no lesions  Pap taken: No. Bimanual Exam:  Uterus:  normal size, contour, position, consistency, mobility, non-tender              Adnexa: normal adnexa and no mass, fullness, tenderness               Rectovaginal: Confirms               Anus:  normal sphincter tone, no lesions  Chaperone was present for exam.  A:  Well Woman with normal exam  H/O menorrhagia with neg u/s and endometrial biopsy 6/11. Oligomenorrhea.  Off OCPs. H/O cesarean section x 2 with BTL  Diabetes  H/o anemia Morbid obesity  Depression  P: Mammogram yearly.  pap smear with neg HR HPV 2013. Pap 2015.  No pap today.   Lipids, CMP, TSH FSH and prolactin Affirm, GC and chl testing today Names for psychiatrists given Weight loss information provided.  return annually or prn

## 2015-09-18 LAB — WET PREP BY MOLECULAR PROBE
CANDIDA SPECIES: POSITIVE — AB
Gardnerella vaginalis: POSITIVE — AB
TRICHOMONAS VAG: NEGATIVE

## 2015-09-18 LAB — GC/CHLAMYDIA PROBE AMP, URINE
CHLAMYDIA, SWAB/URINE, PCR: NEGATIVE
GC PROBE AMP, URINE: NEGATIVE

## 2015-09-18 LAB — PROLACTIN: PROLACTIN: 6.7 ng/mL

## 2015-09-18 LAB — FOLLICLE STIMULATING HORMONE: FSH: 12.4 m[IU]/mL

## 2015-09-21 ENCOUNTER — Telehealth: Payer: Self-pay | Admitting: Obstetrics & Gynecology

## 2015-09-21 MED ORDER — MEDROXYPROGESTERONE ACETATE 10 MG PO TABS: 10.0000 mg | ORAL_TABLET | Freq: Every day | ORAL | Status: DC

## 2015-09-21 MED ORDER — METRONIDAZOLE 500 MG PO TABS: 500.0000 mg | ORAL_TABLET | Freq: Two times a day (BID) | ORAL | Status: DC

## 2015-09-21 MED ORDER — FLUCONAZOLE 150 MG PO TABS: ORAL_TABLET | ORAL | Status: DC

## 2015-09-21 NOTE — Telephone Encounter (Signed)
Return call to patient. Left message to call back. 

## 2015-09-21 NOTE — Telephone Encounter (Signed)
Patient returned call. Notified of all lab results as directed below. Also notified of negative GC/CHL results which have resulted since this note written. Patient given detailed instructions regarding Provera and calling with update on menses. Explained risk for hyperplasia, importance to follow. Patient instructed not to have any alcohol while on Flagyl and for 3 days following.   Requests RX to [facility]. Rx sent as directed.   Routing to provider for final review. Patient agreeable to disposition. Will close encounter.

## 2015-09-21 NOTE — Telephone Encounter (Signed)
-----   Message from Megan Salon, MD sent at 09/19/2015 10:48 AM EDT ----- Inform pt her affirm was + for both yeast and BV.  This is why the OTC yeast treatment hasn't fixed the problem.  Needs Flagyl 500mg  bid x 7 days.  No ETOH with this abx.  Also needs Diflucan 150mg  po q 3 days for 3 doses.  Please call in for pt.    Tonsina shows this is mildly elevated which explains the irregular bleeding.  However, she is not menopausal.  Needs Provera 10mg  x 10 days.  This will trigger a cycle.  If the cycle is heavy, she needs to call.  May need endometrial biopsy.  She is at risk for hyperplasia due to obesity and having skipped cycles for so long.  She should call when cycle starts to let us know about how heavy it is, either way.    Prolactin and TSH normal.  CBC (and hemoglobin was normal).  H/O anemia.  CMP normal except for glucose.  This is followed by her PCP and last hbA1C per pt was in the 6 range.  Lipids normal.    GC/Chl still pending.  Ok to hold for this as should be back Monday or Tues.

## 2015-09-21 NOTE — Telephone Encounter (Signed)
Patient is asking for her recent results from 09/17/15.

## 2015-10-05 ENCOUNTER — Telehealth: Payer: Self-pay | Admitting: Obstetrics & Gynecology

## 2015-10-05 ENCOUNTER — Ambulatory Visit (INDEPENDENT_AMBULATORY_CARE_PROVIDER_SITE_OTHER): Payer: Managed Care, Other (non HMO) | Admitting: Obstetrics & Gynecology

## 2015-10-05 DIAGNOSIS — N921 Excessive and frequent menstruation with irregular cycle: Secondary | ICD-10-CM

## 2015-10-05 LAB — POCT URINE PREGNANCY: Preg Test, Ur: NEGATIVE

## 2015-10-05 NOTE — Telephone Encounter (Signed)
Left message to call Kaitlyn at [phone].  ----- Message from Megan Salon, MD sent at 09/19/2015 10:48 AM EDT ----- Inform pt her affirm was + for both yeast and BV. This is why the OTC yeast treatment hasn't fixed the problem. Needs Flagyl 500mg  bid x 7 days. No ETOH with this abx. Also needs Diflucan 150mg  po q 3 days for 3 doses. Please call in for pt.   [place] shows this is mildly elevated which explains the irregular bleeding. However, she is not menopausal. Needs Provera 10mg  x 10 days. This will trigger a cycle. If the cycle is heavy, she needs to call. May need endometrial biopsy. She is at risk for hyperplasia due to obesity and having skipped cycles for so long. She should call when cycle starts to let us know about how heavy it is, either way.   Prolactin and TSH normal.  CBC (and hemoglobin was normal). H/O anemia. CMP normal except for glucose. This is followed by her PCP and last hbA1C per pt was in the 6 range. Lipids normal.   GC/Chl still pending. Ok to hold for this as should be back Monday or Tues.

## 2015-10-05 NOTE — Telephone Encounter (Signed)
Please have pt come now if possible.  She is an add-on but needs CBC and endometrial biopsy.  I am not surprised by the bleeding due to her skipping a cycle for so long.

## 2015-10-05 NOTE — Telephone Encounter (Signed)
Spoke with patient. Patient completed her Provera challenge on 10/20. Patient began to have bleeding yesterday. Is having to change her pad every hour and a half to two hours due to filling pad. Is feeling increasingly fatigued. Denies and shortness of breath, chest pain, weakness, or dizziness. Advised patient that I will need to give Dr.Miller an update on how she is doing as per Dr.Miller's note below she may need to have an EMB. Patient is agreeable.

## 2015-10-05 NOTE — Telephone Encounter (Signed)
Spoke with patient. Advised of message from [place] as seen below. Patient is agreeable. Patient lives in [place] and is watching her grand daughter as her family just had a new grand baby. Unable to be at the office until 3 pm. Appointment scheduled for today at 3 pm with Dr.Miller. Patient is agreeable to date and time. EMD ordered for precert.  Cc: Theresia Lo  Routing to provider for final review. Patient agreeable to disposition. Will close encounter.

## 2015-10-05 NOTE — Telephone Encounter (Signed)
Patient calling to let Dr. Sabra Heck know she started her menstrual cycle yesterday.

## 2015-10-08 ENCOUNTER — Encounter: Payer: Self-pay | Admitting: Obstetrics & Gynecology

## 2015-10-08 ENCOUNTER — Telehealth: Payer: Self-pay

## 2015-10-08 MED ORDER — NORETHINDRONE 0.35 MG PO TABS: 1.0000 | ORAL_TABLET | Freq: Every day | ORAL | Status: DC

## 2015-10-08 NOTE — Progress Notes (Signed)
Subjective:     Patient ID: Kelli [name], female   DOB: 04-19-1971, 44 y.o.   MRN: [id number]  HPI 44 yo G3P2 MWF, morbidly obese WF, here for complaint of heavy vaginal bleeding that started yesterday.  Pt seen 09/17/15 for AEX and hadn't had a cycle in several months.  H/O BTL,.  FSH, prolactin, TSH checked and these were normal.  Pt treated with Provera challenge.  Bleeding started after Provera was completed.  Pt reports bleeding is heavy and, at times, she is changing a regular maxi pad every 1 to 1 1/2 hours.  She has passed some clots and is having some mild cramping.  The volume of bleeding was concerning to her.  She's also had some fatigue the last 24 hours as well.  Denies SOB, chest pain, light headedness, dizziness.  Reports the last hour or two, bleeding seems to have slacked off some.   Review of Systems  All other systems reviewed and are negative.      Objective:   Physical Exam  Constitutional: She is oriented to person, place, and time. She appears well-developed and well-nourished.  Cardiovascular: Normal rate and regular rhythm.   Pulmonary/Chest: Effort normal.  Abdominal: Soft. Bowel sounds are normal.  Genitourinary: Uterus normal. There is no rash, tenderness or lesion on the right labia. There is no rash, tenderness or lesion on the left labia. Cervix exhibits no motion tenderness. Right adnexum displays no mass, no tenderness and no fullness. Left adnexum displays no mass, no tenderness and no fullness. There is bleeding (but vaginal is not full of blood) in the vagina.  Bleeding is noted but is not actively "dripping" from cervix.  Lymphadenopathy:       Right: No inguinal adenopathy present.       Left: No inguinal adenopathy present.  Neurological: She is alert and oriented to person, place, and time.  Skin: Skin is warm and dry.  Psychiatric: She has a normal mood and affect.   Endometrial biopsy recommended.  Discussed with patient.  Verbal and written  consent obtained.   Procedure:  Speculum placed.  Cervix visualized and cleansed with betadine prep.  A single toothed tenaculum was not applied to the anterior lip of the cervix.  Endometrial pipelle was advanced through the cervix into the endometrial cavity without difficulty.  Pipelle passed to 8.5cm.  Suction applied and pipelle removed with good tissue sample obtained.  Two passes performed due to her bleeding.  Tenculum removed.  No bleeding noted.  Patient tolerated procedure well.  Finger stick Hb 12.7 today.    Assessment:     Menorrhagia after provera challenge that followed several months of probable anovulation and amenorrhea Morbid obesity H/o BTL     Plan:     Endometrial biopsy pending.  Results will be called to pt.  She may need to restart OCPs, start cyclic progesterone, or consider endometrial ablation, or consider Mirena IUD.

## 2015-10-08 NOTE — Telephone Encounter (Signed)
-----   Message from Megan Salon, MD sent at 10/08/2015  6:51 AM EDT ----- Inform endometrial biopsy was negative for abnormal cells.  Pt should start Micronor to help prevent this from happening again.  She may also want to consider an endometrial ablation or Mirena IUD.  We could send her brochures about both but I would go ahead and start the micronor now.  Remind her it is a daily pill without any placebo days.  Recheck 3 months.

## 2015-10-08 NOTE — Telephone Encounter (Signed)
Spoke with patient. Advised of results and message as seen below from [place]. Patient is agreeable and verbalizes understanding. Would like to start Micronor at this time. Aware this is a pill pack without a placebo week. 3 month recheck scheduled for 01/04/2016 at 2:30 pm with Dr.Miller. Agreeable to date and time. Rx for Micronor #3 0RF sent to pharmacy on file. Brochures for endometrial ablation and Mirena IUD sent to patient's verified home address on file. Patient will review these and if she would like to proceed with either option will return call to office.  Routing to provider for final review. Patient agreeable to disposition. Will close encounter.

## 2015-10-18 ENCOUNTER — Telehealth: Payer: Self-pay | Admitting: Obstetrics & Gynecology

## 2015-10-18 NOTE — Telephone Encounter (Signed)
Patient calling to get a code for insurance purposes for an endometrial ablation procedure.

## 2015-12-22 ENCOUNTER — Other Ambulatory Visit: Payer: Self-pay | Admitting: Obstetrics & Gynecology

## 2015-12-22 NOTE — Telephone Encounter (Signed)
Medication refill request: Kelli [name] AEX:  08-07-14 Next AEX: 01-09-17 Next OV: 01-04-16 [name] MMG (if hormonal medication request): 10-15-13 WNL   Refill authorized: please advise

## 2015-12-22 NOTE — Telephone Encounter (Signed)
Medication refill request: Camila  Last AEX:  09/17/15 SM  Next AEX: 01/04/16 SM Last MMG (if hormonal medication request): 10/16/13 [id number]:neg Refill authorized: 10/08/15 #[id number]/0R. Today please advise

## 2016-01-04 ENCOUNTER — Telehealth: Payer: Self-pay | Admitting: Obstetrics & Gynecology

## 2016-01-04 ENCOUNTER — Encounter: Payer: Self-pay | Admitting: Obstetrics & Gynecology

## 2016-01-04 ENCOUNTER — Ambulatory Visit: Payer: Managed Care, Other (non HMO) | Admitting: Obstetrics & Gynecology

## 2016-01-04 NOTE — Telephone Encounter (Signed)
Patient did not keep her appointment today for a 3 month recheck with Dr. Sabra Heck. I called the patient and left a message to call back to reschedule.

## 2016-01-04 NOTE — Telephone Encounter (Signed)
Please send appropriate letter.  Ok to close encounter.

## 2016-01-05 NOTE — Telephone Encounter (Signed)
Letter sent. Closing encounter.  

## 2016-01-09 ENCOUNTER — Emergency Department (HOSPITAL_COMMUNITY)
Admission: EM | Admit: 2016-01-09 | Discharge: 2016-01-09 | Disposition: A | Discharge status: Home / Self Care | Payer: Managed Care, Other (non HMO) | Attending: Emergency Medicine | Admitting: Emergency Medicine

## 2016-01-09 ENCOUNTER — Encounter (HOSPITAL_COMMUNITY): Payer: Self-pay | Admitting: Emergency Medicine

## 2016-01-09 DIAGNOSIS — Z791 Long term (current) use of non-steroidal anti-inflammatories (NSAID): Secondary | ICD-10-CM | POA: Insufficient documentation

## 2016-01-09 DIAGNOSIS — E669 Obesity, unspecified: Secondary | ICD-10-CM | POA: Insufficient documentation

## 2016-01-09 DIAGNOSIS — Z793 Long term (current) use of hormonal contraceptives: Secondary | ICD-10-CM | POA: Insufficient documentation

## 2016-01-09 DIAGNOSIS — Z86010 Personal history of colonic polyps: Secondary | ICD-10-CM | POA: Diagnosis not present

## 2016-01-09 DIAGNOSIS — N762 Acute vulvitis: Secondary | ICD-10-CM | POA: Diagnosis not present

## 2016-01-09 DIAGNOSIS — F411 Generalized anxiety disorder: Secondary | ICD-10-CM | POA: Insufficient documentation

## 2016-01-09 DIAGNOSIS — F329 Major depressive disorder, single episode, unspecified: Secondary | ICD-10-CM | POA: Insufficient documentation

## 2016-01-09 DIAGNOSIS — N764 Abscess of vulva: Secondary | ICD-10-CM | POA: Insufficient documentation

## 2016-01-09 DIAGNOSIS — Z8719 Personal history of other diseases of the digestive system: Secondary | ICD-10-CM | POA: Diagnosis not present

## 2016-01-09 DIAGNOSIS — E785 Hyperlipidemia, unspecified: Secondary | ICD-10-CM | POA: Insufficient documentation

## 2016-01-09 DIAGNOSIS — G8929 Other chronic pain: Secondary | ICD-10-CM | POA: Insufficient documentation

## 2016-01-09 DIAGNOSIS — Z79899 Other long term (current) drug therapy: Secondary | ICD-10-CM | POA: Diagnosis not present

## 2016-01-09 DIAGNOSIS — Z87891 Personal history of nicotine dependence: Secondary | ICD-10-CM | POA: Insufficient documentation

## 2016-01-09 DIAGNOSIS — R110 Nausea: Secondary | ICD-10-CM | POA: Insufficient documentation

## 2016-01-09 DIAGNOSIS — E119 Type 2 diabetes mellitus without complications: Secondary | ICD-10-CM | POA: Diagnosis not present

## 2016-01-09 DIAGNOSIS — Z7984 Long term (current) use of oral hypoglycemic drugs: Secondary | ICD-10-CM | POA: Insufficient documentation

## 2016-01-09 MED ORDER — OXYCODONE-ACETAMINOPHEN 5-325 MG PO TABS: 1.0000 | ORAL_TABLET | ORAL | Status: DC | PRN

## 2016-01-09 MED ORDER — LIDOCAINE HCL (PF) 2 % IJ SOLN
INTRAMUSCULAR | Status: AC
  Administered 2016-01-09: 10 mL
  Filled 2016-01-09: qty 10

## 2016-01-09 MED ORDER — CLINDAMYCIN PHOSPHATE 600 MG/50ML IV SOLN
600.0000 mg | Freq: Once | INTRAVENOUS | Status: AC
  Administered 2016-01-09: 600 mg via INTRAVENOUS
  Filled 2016-01-09: qty 50

## 2016-01-09 MED ORDER — POVIDONE-IODINE 10 % EX SOLN
CUTANEOUS | Status: AC
  Filled 2016-01-09: qty 118

## 2016-01-09 MED ORDER — LIDOCAINE HCL (PF) 2 % IJ SOLN
10.0000 mL | Freq: Once | INTRAMUSCULAR | Status: AC
  Administered 2016-01-09: 10 mL

## 2016-01-09 MED ORDER — SULFAMETHOXAZOLE-TRIMETHOPRIM 800-160 MG PO TABS: 1.0000 | ORAL_TABLET | Freq: Two times a day (BID) | ORAL | Status: AC

## 2016-01-09 NOTE — ED Notes (Signed)
Patient c/o abscess to right labia. Per patient area became tender on 1/25 and has increasingly became more painful and swollen. Per patient small amount of serosanguinous drainage. Unsure of any fevers.

## 2016-01-09 NOTE — ED Provider Notes (Signed)
CSN: [id number]     Arrival date & time 01/09/16  0736 History   First MD Initiated Contact with Patient 01/09/16 0757     Chief Complaint  Patient presents with  . Abscess     (Consider location/radiation/quality/duration/timing/severity/associated sxs/prior Treatment) HPI   Kelli [name] is a 45 y.o. female who presents to the Emergency Department complaining of painful, swollen area to the right labia majora.  Symptoms increasing for 4 days.  She states it began draining a small amt of yellow fluid yesterday.  She reports hx of DM and has frequent boils.  She has associated malaise and nausea.  She denies fever, chills, vomiting, abdominal pain. She has tried squeezing it without relief.   Past Medical History  Diagnosis Date  . Depression   . Diabetes mellitus   . Hyperlipidemia   . Chronic headache   . Obesity   . Gallstones   . GAD (generalized anxiety disorder)   . Diverticulosis   . Colon polyps 03/28/11  . Migraines    Past Surgical History  Procedure Laterality Date  . Cholecystectomy  1992  . Rhinoplasty  1993  . Cesarean section  1991, 1995  . Tubal ligation Bilateral 1995   Family History  Problem Relation Age of Onset  . Diabetes Maternal Grandmother    Social History  Substance Use Topics  . Smoking status: Former Smoker -- 1.00 packs/day for 1 years    Quit date: 10/04/2015  . Smokeless tobacco: Never Used  . Alcohol Use: No     Comment: very rare 1-2 drinks per month   OB History    Gravida Para Term Preterm AB TAB SAB Ectopic Multiple Living   3 2        2      Review of Systems  Constitutional: Negative for fever and chills.  Gastrointestinal: Negative for nausea and vomiting.  Musculoskeletal: Negative for joint swelling and arthralgias.  Skin: Positive for color change.       Abscess   Hematological: Negative for adenopathy.  All other systems reviewed and are negative.     Allergies  Aspirin  Home Medications   Prior to  Admission medications   Medication Sig Start Date End Date Taking? Authorizing Provider  ARIPiprazole (ABILIFY) 2 MG tablet Take 2 mg by mouth daily. 08/27/15   Historical Provider, MD  atorvastatin (LIPITOR) 40 MG tablet Take 40 mg by mouth daily at 6 PM.  04/11/13   Historical Provider, MD  buPROPion (WELLBUTRIN XL) 300 MG 24 hr tablet Take 300 mg by mouth daily.      Historical Provider, MD  CAMILA 0.35 MG tablet TAKE 1 TABLET (0.35 MG TOTAL) BY MOUTH DAILY. 12/22/15   Megan Salon, MD  cetirizine (ZYRTEC) 10 MG tablet Take 10 mg by mouth daily.    Historical Provider, MD  Cholecalciferol (VITAMIN D PO) Take 1 capsule by mouth daily.     Historical Provider, MD  glimepiride (AMARYL) 2 MG tablet Take 2 mg by mouth daily. 09/09/15   Historical Provider, MD  HYDROcodone-acetaminophen (NORCO/VICODIN) 5-325 MG per tablet Take one-two tabs po q 4-6 hrs prn pain Patient not taking: Reported on 09/17/2015 12/08/14   Nickholas Goldston, PA-C  ibuprofen (ADVIL,MOTRIN) 800 MG tablet Take 1 tablet (800 mg total) by mouth 3 (three) times daily. Take with food 12/08/14   Olean Sangster, PA-C  Liraglutide (VICTOZA) 18 MG/3ML SOLN Inject 1.2 mg into the skin daily.      Historical Provider, MD  medroxyPROGESTERone (PROVERA) 10 MG tablet Take 1 tablet (10 mg total) by mouth daily. Patient not taking: Reported on 10/05/2015 09/21/15   Megan Salon, MD  Multiple Vitamin (MULTIVITAMIN WITH MINERALS) TABS tablet Take 1 tablet by mouth daily.    Historical Provider, MD  SUMAtriptan (IMITREX) 25 MG tablet Take 25 mg by mouth as needed.      Historical Provider, MD  topiramate (TOPAMAX) 50 MG tablet Take 50 mg by mouth 2 (two) times daily.    Historical Provider, MD   BP 97/67 mmHg  Pulse 74  Temp(Src) 97.5 F (36.4 C) (Oral)  Resp 20  Ht 5\' 4"  (1.626 m)  Wt 127.007 kg  BMI 48.04 kg/m2  SpO2 97%  LMP 12/26/2015 Physical Exam  Constitutional: She is oriented to person, place, and time. She appears well-developed and  well-nourished. No distress.  HENT:  Head: Normocephalic and atraumatic.  Cardiovascular: Normal rate, regular rhythm and normal heart sounds.   No murmur heard. Pulmonary/Chest: Effort normal and breath sounds normal. No respiratory distress.  Abdominal: Soft. She exhibits no distension. There is no tenderness. There is no rebound.  Genitourinary:  Large area of induration to the right labia majora.  Slight purulent drainage noted.  Moderate surrounding erythema.    Musculoskeletal: Normal range of motion.  Neurological: She is alert and oriented to person, place, and time. She exhibits normal muscle tone. Coordination normal.  Skin: Skin is warm and dry.  Nursing note and vitals reviewed.   ED Course  Procedures (including critical care time) Labs Review Labs Reviewed - No data to display  Imaging Review No results found. I have personally reviewed and evaluated these images and lab results as part of my medical decision-making.   EKG Interpretation None      INCISION AND DRAINAGE Performed by: Hale Bogus. Consent: Verbal consent obtained. Risks and benefits: risks, benefits and alternatives were discussed Type: abscess  Body area: right labia majora  Anesthesia: local infiltration  Incision was made with a  #11 scalpel.  Local anesthetic: lidocaine 2 % w/o epinephrine  Anesthetic total: 3 ml  Complexity: complex Blunt dissection to break up loculations  Drainage: purulent  Drainage amount: small  Packing material: 1/4 in iodoform gauze  Patient tolerance: Patient tolerated the procedure well with no immediate complications.    MDM   Final diagnoses:  Labial abscess   Pt is well appearing, non-toxic.  Abscess to the right labia majora with significant surrounding cellulitis.  I&D performed, packing in place and IV clindamycin given.  She agrees to warm water soaks and ER return to 2 days for recheck and packing removal.  Rx for bactrim and  percocet     Kem Parkinson, PA-C 01/09/16 [place], MD 01/11/16 [phone]

## 2016-01-09 NOTE — Discharge Instructions (Signed)
Abscess °An abscess (boil or furuncle) is an infected area on or under the skin. This area is filled with yellowish-white fluid (pus) and other material (debris). °HOME CARE  °· Only take medicines as told by your doctor. °· If you were given antibiotic medicine, take it as directed. Finish the medicine even if you start to feel better. °· If gauze is used, follow your doctor's directions for changing the gauze. °· To avoid spreading the infection: °¨ Keep your abscess covered with a bandage. °¨ Wash your hands well. °¨ Do not share personal care items, towels, or whirlpools with others. °¨ Avoid skin contact with others. °· Keep your skin and clothes clean around the abscess. °· Keep all doctor visits as told. °GET HELP RIGHT AWAY IF:  °· You have more pain, puffiness (swelling), or redness in the wound site. °· You have more fluid or blood coming from the wound site. °· You have muscle aches, chills, or you feel sick. °· You have a fever. °MAKE SURE YOU:  °· Understand these instructions. °· Will watch your condition. °· Will get help right away if you are not doing well or get worse. °  °This information is not intended to replace advice given to you by your health care provider. Make sure you discuss any questions you have with your health care provider. °  °Document Released: 05/15/2008 Document Revised: 05/28/2012 Document Reviewed: 02/10/2012 °Elsevier Interactive Patient Education ©2016 [employer] ° °

## 2016-01-11 ENCOUNTER — Emergency Department (HOSPITAL_COMMUNITY)
Admission: EM | Admit: 2016-01-11 | Discharge: 2016-01-11 | Disposition: A | Discharge status: Home / Self Care | Payer: Managed Care, Other (non HMO) | Attending: Emergency Medicine | Admitting: Emergency Medicine

## 2016-01-11 ENCOUNTER — Encounter (HOSPITAL_COMMUNITY): Payer: Self-pay | Admitting: Emergency Medicine

## 2016-01-11 DIAGNOSIS — Z8719 Personal history of other diseases of the digestive system: Secondary | ICD-10-CM | POA: Diagnosis not present

## 2016-01-11 DIAGNOSIS — Z79899 Other long term (current) drug therapy: Secondary | ICD-10-CM | POA: Diagnosis not present

## 2016-01-11 DIAGNOSIS — Z7984 Long term (current) use of oral hypoglycemic drugs: Secondary | ICD-10-CM | POA: Insufficient documentation

## 2016-01-11 DIAGNOSIS — N764 Abscess of vulva: Secondary | ICD-10-CM | POA: Insufficient documentation

## 2016-01-11 DIAGNOSIS — Z87891 Personal history of nicotine dependence: Secondary | ICD-10-CM | POA: Diagnosis not present

## 2016-01-11 DIAGNOSIS — E669 Obesity, unspecified: Secondary | ICD-10-CM | POA: Diagnosis not present

## 2016-01-11 DIAGNOSIS — Z8679 Personal history of other diseases of the circulatory system: Secondary | ICD-10-CM | POA: Insufficient documentation

## 2016-01-11 DIAGNOSIS — Z793 Long term (current) use of hormonal contraceptives: Secondary | ICD-10-CM | POA: Diagnosis not present

## 2016-01-11 DIAGNOSIS — E785 Hyperlipidemia, unspecified: Secondary | ICD-10-CM | POA: Insufficient documentation

## 2016-01-11 DIAGNOSIS — F329 Major depressive disorder, single episode, unspecified: Secondary | ICD-10-CM | POA: Diagnosis not present

## 2016-01-11 DIAGNOSIS — E119 Type 2 diabetes mellitus without complications: Secondary | ICD-10-CM | POA: Diagnosis not present

## 2016-01-11 DIAGNOSIS — G8929 Other chronic pain: Secondary | ICD-10-CM | POA: Diagnosis not present

## 2016-01-11 DIAGNOSIS — Z4801 Encounter for change or removal of surgical wound dressing: Secondary | ICD-10-CM | POA: Diagnosis present

## 2016-01-11 DIAGNOSIS — F411 Generalized anxiety disorder: Secondary | ICD-10-CM | POA: Diagnosis not present

## 2016-01-11 DIAGNOSIS — Z86010 Personal history of colonic polyps: Secondary | ICD-10-CM | POA: Diagnosis not present

## 2016-01-11 MED ORDER — OXYCODONE-ACETAMINOPHEN 5-325 MG PO TABS: 1.0000 | ORAL_TABLET | Freq: Three times a day (TID) | ORAL | Status: DC | PRN

## 2016-01-11 NOTE — ED Notes (Signed)
Patient with no complaints at this time. Respirations even and unlabored. Skin warm/dry. Discharge instructions reviewed with patient at this time. Patient given opportunity to voice concerns/ask questions. Patient discharged at this time and left Emergency Department with steady gait.   

## 2016-01-11 NOTE — ED Notes (Signed)
Patient here for recheck of vaginal abscess that had I&D two days ago. Taking warm tub soaks and antibiotics as prescribed. Continued pain.

## 2016-01-11 NOTE — ED Provider Notes (Signed)
CSN: [id number]     Arrival date & time 01/11/16  [id number] History  By signing my name below, I, Hilda Lias, attest that this documentation has been prepared under the direction and in the presence of Ripley Fraise, MD. Electronically Signed: Hilda Lias, ED Scribe. 01/11/2016. 11:18 AM.    Chief Complaint  Patient presents with  . Wound Check      Patient is a 45 y.o. female presenting with wound check. The history is provided by the patient. No language interpreter was used.  Wound Check This is a new problem. The current episode started 2 days ago. The problem occurs constantly. The problem has been gradually improving. Nothing aggravates the symptoms. The symptoms are relieved by medications.   HPI Comments: Kelli [name] is a 45 y.o. female who presents to the Emergency Department for a wound check on an abscess to her vagina that she had an I&D on two days ago. Pt reports she was having drainage from the area up until last night, and states she has been taking her antibiotics as prescribed. Pt denies fever, vomiting, or nausea.    Past Medical History  Diagnosis Date  . Depression   . Diabetes mellitus   . Hyperlipidemia   . Chronic headache   . Obesity   . Gallstones   . GAD (generalized anxiety disorder)   . Diverticulosis   . Colon polyps 03/28/11  . Migraines    Past Surgical History  Procedure Laterality Date  . Cholecystectomy  1992  . Rhinoplasty  1993  . Cesarean section  1991, 1995  . Tubal ligation Bilateral 1995   Family History  Problem Relation Age of Onset  . Diabetes Maternal Grandmother    Social History  Substance Use Topics  . Smoking status: Former Smoker -- 1.00 packs/day for 1 years    Quit date: 10/04/2015  . Smokeless tobacco: Never Used  . Alcohol Use: No     Comment: very rare 1-2 drinks per month   OB History    Gravida Para Term Preterm AB TAB SAB Ectopic Multiple Living   3 2        2      Review of Systems   Constitutional: Negative for fever.  Gastrointestinal: Negative for nausea and vomiting.  Skin: Positive for wound.      Allergies  Aspirin  Home Medications   Prior to Admission medications   Medication Sig Start Date End Date Taking? Authorizing Provider  ARIPiprazole (ABILIFY) 2 MG tablet Take 2 mg by mouth daily. 08/27/15  Yes Historical Provider, MD  atorvastatin (LIPITOR) 40 MG tablet Take 40 mg by mouth daily at 6 PM.  04/11/13  Yes Historical Provider, MD  buPROPion (WELLBUTRIN XL) 300 MG 24 hr tablet Take 300 mg by mouth daily.     Yes Historical Provider, MD  CAMILA 0.35 MG tablet TAKE 1 TABLET (0.35 MG TOTAL) BY MOUTH DAILY. 12/22/15  Yes Megan Salon, MD  cetirizine (ZYRTEC) 10 MG tablet Take 10 mg by mouth daily.   Yes Historical Provider, MD  glimepiride (AMARYL) 2 MG tablet Take 2 mg by mouth daily. 09/09/15  Yes Historical Provider, MD  ibuprofen (ADVIL,MOTRIN) 200 MG tablet Take 400-600 mg by mouth 2 (two) times daily as needed for moderate pain.   Yes Historical Provider, MD  Liraglutide (VICTOZA) 18 MG/3ML SOLN Inject 1.8 mg into the skin daily.    Yes Historical Provider, MD  Multiple Vitamin (MULTIVITAMIN WITH MINERALS) TABS tablet Take 1 tablet  by mouth daily.   Yes Historical Provider, MD  oxyCODONE-acetaminophen (PERCOCET/ROXICET) 5-325 MG tablet Take 1 tablet by mouth every 4 (four) hours as needed. 01/09/16  Yes Tammy Triplett, PA-C  sulfamethoxazole-trimethoprim (BACTRIM DS,SEPTRA DS) 800-160 MG tablet Take 1 tablet by mouth 2 (two) times daily. For 10 days 01/09/16 01/16/16 Yes Tammy Triplett, PA-C  SUMAtriptan (IMITREX) 25 MG tablet Take 25 mg by mouth every 2 (two) hours as needed for migraine.    Yes Historical Provider, MD  topiramate (TOPAMAX) 50 MG tablet Take 50-100 mg by mouth 2 (two) times daily. 50 mg in the morning and 100 mg at night.   Yes Historical Provider, MD   BP 106/49 mmHg  Pulse 76  Temp(Src) 97.7 F (36.5 C) (Oral)  Resp 18  Wt 280 lb  (127.007 kg)  SpO2 100%  LMP 12/26/2015 Physical Exam  Nursing note and vitals reviewed.  CONSTITUTIONAL: Well developed/well nourished HEAD: Normocephalic/atraumatic ENMT: Mucous membranes moist NECK: supple no meningeal signs ABDOMEN: soft [id number] healing right labial abscess.  No fluctuance/crepitus.  Mild induration noted.  Small amt of drainage noted.  No overlying erythema.  Nurse daphyne present at bedside NEURO: Pt is awake/alert/appropriate, moves all extremitiesx4.   SKIN: warm, color normal PSYCH: no abnormalities of mood noted, alert and oriented to situation   ED Course  Procedures   DIAGNOSTIC STUDIES: Oxygen Saturation is 100% on room air, normal by my interpretation.    COORDINATION OF CARE: 11:14 AM Discussed treatment plan with pt at bedside and pt agreed to plan. Wound is healing well Refill of pain meds Continue bactrim Continue warm soaks and also advise warm compresses to improve drainage Packing has already been removed Discussed strict return precautions   MDM   Final diagnoses:  Labial abscess    Nursing notes including past medical history and social history reviewed and considered in documentation Previous records reviewed and considered   I personally performed the services described in this documentation, which was scribed in my presence. The recorded information has been reviewed and is accurate.       Ripley Fraise, MD 01/11/16 [phone]

## 2016-01-11 NOTE — Discharge Instructions (Signed)

## 2016-03-02 ENCOUNTER — Encounter: Payer: Self-pay | Admitting: Gastroenterology

## 2016-05-17 ENCOUNTER — Other Ambulatory Visit: Payer: Self-pay | Admitting: Obstetrics & Gynecology

## 2016-07-21 ENCOUNTER — Ambulatory Visit (INDEPENDENT_AMBULATORY_CARE_PROVIDER_SITE_OTHER): Payer: Managed Care, Other (non HMO) | Admitting: Certified Nurse Midwife

## 2016-07-21 ENCOUNTER — Encounter: Payer: Self-pay | Admitting: Certified Nurse Midwife

## 2016-07-21 VITALS — BP 104/68 | HR 70 | Temp 97.8°F | Resp 16 | Ht 64.25 in | Wt 285.0 lb

## 2016-07-21 DIAGNOSIS — A499 Bacterial infection, unspecified: Secondary | ICD-10-CM | POA: Diagnosis not present

## 2016-07-21 DIAGNOSIS — B9689 Other specified bacterial agents as the cause of diseases classified elsewhere: Secondary | ICD-10-CM

## 2016-07-21 DIAGNOSIS — N760 Acute vaginitis: Secondary | ICD-10-CM | POA: Diagnosis not present

## 2016-07-21 DIAGNOSIS — B373 Candidiasis of vulva and vagina: Secondary | ICD-10-CM | POA: Diagnosis not present

## 2016-07-21 DIAGNOSIS — R350 Frequency of micturition: Secondary | ICD-10-CM | POA: Diagnosis not present

## 2016-07-21 DIAGNOSIS — B3731 Acute candidiasis of vulva and vagina: Secondary | ICD-10-CM

## 2016-07-21 LAB — POCT URINALYSIS DIPSTICK
Bilirubin, UA: NEGATIVE
Blood, UA: NEGATIVE
GLUCOSE UA: 100
Ketones, UA: NEGATIVE
Leukocytes, UA: NEGATIVE
NITRITE UA: POSITIVE
Protein, UA: NEGATIVE
UROBILINOGEN UA: NEGATIVE
pH, UA: 7

## 2016-07-21 MED ORDER — FLUCONAZOLE 150 MG PO TABS: 150.0000 mg | ORAL_TABLET | Freq: Once | ORAL | 0 refills | Status: AC

## 2016-07-21 MED ORDER — METRONIDAZOLE 0.75 % VA GEL: 1.0000 | Freq: Every day | VAGINAL | 0 refills | Status: DC

## 2016-07-21 NOTE — Patient Instructions (Signed)
Urinary Tract Infection Urinary tract infections (UTIs) can develop anywhere along your urinary tract. Your urinary tract is your body's drainage system for removing wastes and extra water. Your urinary tract includes two kidneys, two ureters, a bladder, and a urethra. Your kidneys are a pair of bean-shaped organs. Each kidney is about the size of your fist. They are located below your ribs, one on each side of your spine. CAUSES Infections are caused by microbes, which are microscopic organisms, including fungi, viruses, and bacteria. These organisms are so small that they can only be seen through a microscope. Bacteria are the microbes that most commonly cause UTIs. SYMPTOMS  Symptoms of UTIs may vary by age and gender of the patient and by the location of the infection. Symptoms in young women typically include a frequent and intense urge to urinate and a painful, burning feeling in the bladder or urethra during urination. Older women and men are more likely to be tired, shaky, and weak and have muscle aches and abdominal pain. A fever may mean the infection is in your kidneys. Other symptoms of a kidney infection include pain in your back or sides below the ribs, nausea, and vomiting. DIAGNOSIS To diagnose a UTI, your caregiver will ask you about your symptoms. Your caregiver will also ask you to provide a urine sample. The urine sample will be tested for bacteria and white blood cells. White blood cells are made by your body to help fight infection. TREATMENT  Typically, UTIs can be treated with medication. Because most UTIs are caused by a bacterial infection, they usually can be treated with the use of antibiotics. The choice of antibiotic and length of treatment depend on your symptoms and the type of bacteria causing your infection. HOME CARE INSTRUCTIONS  If you were prescribed antibiotics, take them exactly as your caregiver instructs you. Finish the medication even if you feel better after  you have only taken some of the medication.  Drink enough water and fluids to keep your urine clear or pale yellow.  Avoid caffeine, tea, and carbonated beverages. They tend to irritate your bladder.  Empty your bladder often. Avoid holding urine for long periods of time.  Empty your bladder before and after sexual intercourse.  After a bowel movement, women should cleanse from front to back. Use each tissue only once. SEEK MEDICAL CARE IF:   You have back pain.  You develop a fever.  Your symptoms do not begin to resolve within 3 days. SEEK IMMEDIATE MEDICAL CARE IF:   You have severe back pain or lower abdominal pain.  You develop chills.  You have nausea or vomiting.  You have continued burning or discomfort with urination. MAKE SURE YOU:   Understand these instructions.  Will watch your condition.  Will get help right away if you are not doing well or get worse.   This information is not intended to replace advice given to you by your health care provider. Make sure you discuss any questions you have with your health care provider.   Document Released: 09/06/2005 Document Revised: 08/18/2015 Document Reviewed: 01/05/2012 Elsevier Interactive Patient Education 2016 Elsevier Inc. Bacterial Vaginosis Bacterial vaginosis is a vaginal infection that occurs when the normal balance of bacteria in the vagina is disrupted. It results from an overgrowth of certain bacteria. This is the most common vaginal infection in women of childbearing age. Treatment is important to prevent complications, especially in pregnant women, as it can cause a premature delivery. CAUSES  Bacterial  vaginosis is caused by an increase in harmful bacteria that are normally present in smaller amounts in the vagina. Several different kinds of bacteria can cause bacterial vaginosis. However, the reason that the condition develops is not fully understood. RISK FACTORS Certain activities or behaviors can  put you at an increased risk of developing bacterial vaginosis, including:  Having a new sex partner or multiple sex partners.  Douching.  Using an intrauterine device (IUD) for contraception. Women do not get bacterial vaginosis from toilet seats, bedding, swimming pools, or contact with objects around them. SIGNS AND SYMPTOMS  Some women with bacterial vaginosis have no signs or symptoms. Common symptoms include:  Grey vaginal discharge.  A fishlike odor with discharge, especially after sexual intercourse.  Itching or burning of the vagina and vulva.  Burning or pain with urination. DIAGNOSIS  Your health care provider will take a medical history and examine the vagina for signs of bacterial vaginosis. A sample of vaginal fluid may be taken. Your health care provider will look at this sample under a microscope to check for bacteria and abnormal cells. A vaginal pH test may also be done.  TREATMENT  Bacterial vaginosis may be treated with antibiotic medicines. These may be given in the form of a pill or a vaginal cream. A second round of antibiotics may be prescribed if the condition comes back after treatment. Because bacterial vaginosis increases your risk for sexually transmitted diseases, getting treated can help reduce your risk for chlamydia, gonorrhea, HIV, and herpes. HOME CARE INSTRUCTIONS   Only take over-the-counter or prescription medicines as directed by your health care provider.  If antibiotic medicine was prescribed, take it as directed. Make sure you finish it even if you start to feel better.  Tell all sexual partners that you have a vaginal infection. They should see their health care provider and be treated if they have problems, such as a mild rash or itching.  During treatment, it is important that you follow these instructions:  Avoid sexual activity or use condoms correctly.  Do not douche.  Avoid alcohol as directed by your health care provider.  Avoid  breastfeeding as directed by your health care provider. SEEK MEDICAL CARE IF:   Your symptoms are not improving after 3 days of treatment.  You have increased discharge or pain.  You have a fever. MAKE SURE YOU:   Understand these instructions.  Will watch your condition.  Will get help right away if you are not doing well or get worse. FOR MORE INFORMATION  Centers for Disease Control and Prevention, Division of STD Prevention: Appraiser[employer]raud[employer] American Sexual Health Association (ASHA): www.ashastd.org    This information is not intended to replace advice given to you by your health care provider. Make sure you discuss any questions you have with your health care provider.   Document Released: 11/27/2005 Document Revised: 12/18/2014 Document Reviewed: 07/09/2013 Elsevier Interactive Patient Education 2016 Elsevier Inc. Monilial Vaginitis Vaginitis in a soreness, swelling and redness (inflammation) of the vagina and vulva. Monilial vaginitis is not a sexually transmitted infection. CAUSES  Yeast vaginitis is caused by yeast (candida) that is normally found in your vagina. With a yeast infection, the candida has overgrown in number to a point that upsets the chemical balance. SYMPTOMS   White, thick vaginal discharge.  Swelling, itching, redness and irritation of the vagina and possibly the lips of the vagina (vulva).  Burning or painful urination.  Painful intercourse. DIAGNOSIS  Things that may contribute to monilial  vaginitis are:  Postmenopausal and virginal states.  Pregnancy.  Infections.  Being tired, sick or stressed, especially if you had monilial vaginitis in the past.  Diabetes. Good control will help lower the chance.  Birth control pills.  Tight fitting garments.  Using bubble bath, feminine sprays, douches or deodorant tampons.  Taking certain medications that kill germs (antibiotics).  Sporadic recurrence can occur if you become ill. TREATMENT    Your caregiver will give you medication.  There are several kinds of anti monilial vaginal creams and suppositories specific for monilial vaginitis. For recurrent yeast infections, use a suppository or cream in the vagina 2 times a week, or as directed.  Anti-monilial or steroid cream for the itching or irritation of the vulva may also be used. Get your caregiver's permission.  Painting the vagina with methylene blue solution may help if the monilial cream does not work.  Eating yogurt may help prevent monilial vaginitis. HOME CARE INSTRUCTIONS   Finish all medication as prescribed.  Do not have sex until treatment is completed or after your caregiver tells you it is okay.  Take warm sitz baths.  Do not douche.  Do not use tampons, especially scented ones.  Wear cotton underwear.  Avoid tight pants and panty hose.  Tell your sexual partner that you have a yeast infection. They should go to their caregiver if they have symptoms such as mild rash or itching.  Your sexual partner should be treated as well if your infection is difficult to eliminate.  Practice safer sex. Use condoms.  Some vaginal medications cause latex condoms to fail. Vaginal medications that harm condoms are:  Cleocin cream.  Butoconazole (Femstat).  Terconazole (Terazol) vaginal suppository.  Miconazole (Monistat) (may be purchased over the counter). SEEK MEDICAL CARE IF:   You have a temperature by mouth above 102 F (38.9 C).  The infection is getting worse after 2 days of treatment.  The infection is not getting better after 3 days of treatment.  You develop blisters in or around your vagina.  You develop vaginal bleeding, and it is not your menstrual period.  You have pain when you urinate.  You develop intestinal problems.  You have pain with sexual intercourse.   This information is not intended to replace advice given to you by your health care provider. Make sure you discuss  any questions you have with your health care provider.   Document Released: 09/06/2005 Document Revised: 02/19/2012 Document Reviewed: 05/31/2015 Elsevier Interactive Patient Education [employer].

## 2016-07-21 NOTE — Progress Notes (Signed)
46 y.o. Married Caucasian female G3P2 here with complaint of UTI, with onset  on a few weeks ago. Treated self with Monistat cream thinking it was vaginal, seemed to feel slightly better, then returned.. Patient complaining of urinary frequency/urgency/ and pain with urination. Patient denies fever, chills, nausea or back pain. No new personal products. Patient feels not to sexual activity. Also complaining of vaginal symptoms of itching,burning and increased discharge with watery or brownish. Periods have been irregular, had not had a period in 3-4 months and had a normal one 07/07/16   Contraception is BTL.Marland Kitchen Patient is type 2 diabetic and has not seen her PCP in the past 2 months, struggling with medications cost, but taking her diabetic medications. No other health issues today.    Wet Prep: KOH, Saline, positive for BV  O: Healthy female WDWN Affect: Normal, orientation x 3 Skin : warm and dry CVAT: negative bilateral Abdomen: negative for suprapubic tenderness Slight macular rash under abdominal flap (itching per patient and stays moist at times.  Pelvic exam: External genital area:increased redness, slight scaling, no exudate, normal female Bladder,Urethra , non tender Urethral meatus: non tender Vagina:yellow watery odorous vaginal discharge, normal vaginal appearance  Wet prep taken ph 5.0 Cervix: normal, non tender Uterus:normal,non tender limited exam due to morbid obesity Adnexa:non palpable, no fullness or masses, limited pelvic exam due to body habitus  A:Normal pelvic exam ?UTI BV poct urine- nitrite pos, ph 7.0, glucose 100  P: Reviewed findings of ? UTI and BV. Discussed feel external symptoms may be causing urinary ones. Etiology discussed. Will treat BV and hold for urine culture and treat if indicated. Marland Kitchen Rx: Metrogel see order with instructions [id number] culture Reviewed warning signs and symptoms of UTI and need to advise if occurring. Encouraged to limit soda, tea,  and coffee and be sure to increase water intake. Make sure she is monitoring her glucose levels and taking medication appropriately.   RV prn

## 2016-07-24 ENCOUNTER — Other Ambulatory Visit: Payer: Self-pay | Admitting: Certified Nurse Midwife

## 2016-07-24 DIAGNOSIS — N390 Urinary tract infection, site not specified: Secondary | ICD-10-CM

## 2016-07-24 LAB — URINE CULTURE: Colony Count: >=100000

## 2016-07-24 MED ORDER — NITROFURANTOIN MONOHYD MACRO 100 MG PO CAPS: 100.0000 mg | ORAL_CAPSULE | Freq: Two times a day (BID) | ORAL | 0 refills | Status: DC

## 2016-07-27 NOTE — Progress Notes (Signed)
Encounter reviewed Dyshaun Bonzo, MD   

## 2016-08-01 ENCOUNTER — Telehealth: Payer: Self-pay | Admitting: Certified Nurse Midwife

## 2016-08-01 NOTE — Telephone Encounter (Signed)
Spoke with patient. Patient reports that she completed her rx for Macrobid a few days ago for treatment of UTI. Also completed rx for Metrogel for treatment of BV. Reports today she noticed one episode of white/brown vaginal discharge. Denies any vaginal irritation, burning, itching or urinary symptoms. Advised patient with completion of Metrogel and Macrobid it is not uncommon to have some vaginal discharge as the infection clears and sometimes can develop yeast from antibiotic. Offered to schedule a recheck appointment with Melvia Heaps CNM but patient declines. Advised if symptoms worsen or develops new symptoms will need to be seen in the office for a recheck. She is agreeable.  Routing to provider for final review. Patient agreeable to disposition. Will close encounter.

## 2016-08-01 NOTE — Telephone Encounter (Signed)
Patent was seen recently 07/21/16 and is still having some of the symptoms. Patient would like to talk with a nurse.

## 2016-08-01 NOTE — Telephone Encounter (Signed)
Left message to call Rilya Longo at [phone]. 

## 2016-08-07 ENCOUNTER — Telehealth: Payer: Self-pay | Admitting: Obstetrics & Gynecology

## 2016-08-07 NOTE — Telephone Encounter (Signed)
Patient wants to speak with the nurse no information given. °

## 2016-08-08 NOTE — Telephone Encounter (Signed)
Left message to call Kaitlyn at [phone]. 

## 2016-08-08 NOTE — Telephone Encounter (Signed)
Spoke with patient. Patient was seen in the office on 07/21/2016 with Melvia Heaps CNM. Patient completed full course of Metrogel for BV and Macrobid for UTI around 07/30/2016. Patient call in on 08/01/2016 please see telephone note stating that she had one episode of white/brown vaginal discharge. She was advised to monitor her symptoms as increased discharge is not uncommon following the use of Metrogel. Patient is calling today to report that she is having increased vaginal itching with thick white vaginal discharge that started over the weekend, but is worsening. Requesting rx for yeast infection. Advised I will speak with provider and return call with further recommendations. She is agreeable. Pharmacy on file is correct.  Routing to [facility] for review and advise as the patient only saw Melvia Heaps CNM for problem visit and usually sees Dr.Miller.

## 2016-08-09 MED ORDER — FLUCONAZOLE 150 MG PO TABS: 150.0000 mg | ORAL_TABLET | Freq: Once | ORAL | 0 refills | Status: AC

## 2016-08-09 NOTE — Telephone Encounter (Signed)
Rx for Diflucan 150 mg po x 1, repeat in 72 hours. #2 0RF sent to pharmacy on file.   Left message for patient to call [place] at [phone].

## 2016-08-09 NOTE — Telephone Encounter (Signed)
Ok to treat with diflucan 150mg  po x 1, repeat 72 hours.  #2/0RF.  Thanks.

## 2016-08-09 NOTE — Telephone Encounter (Signed)
Patient notified

## 2016-09-11 ENCOUNTER — Ambulatory Visit (INDEPENDENT_AMBULATORY_CARE_PROVIDER_SITE_OTHER): Payer: Managed Care, Other (non HMO) | Admitting: Obstetrics & Gynecology

## 2016-09-11 ENCOUNTER — Encounter: Payer: Self-pay | Admitting: Obstetrics & Gynecology

## 2016-09-11 VITALS — BP 110/70 | HR 80 | Temp 98.2°F | Resp 16 | Ht 64.5 in | Wt 283.0 lb

## 2016-09-11 DIAGNOSIS — Z202 Contact with and (suspected) exposure to infections with a predominantly sexual mode of transmission: Secondary | ICD-10-CM

## 2016-09-11 DIAGNOSIS — R102 Pelvic and perineal pain: Secondary | ICD-10-CM | POA: Diagnosis not present

## 2016-09-11 DIAGNOSIS — B3731 Acute candidiasis of vulva and vagina: Secondary | ICD-10-CM

## 2016-09-11 DIAGNOSIS — R3915 Urgency of urination: Secondary | ICD-10-CM | POA: Diagnosis not present

## 2016-09-11 DIAGNOSIS — B373 Candidiasis of vulva and vagina: Secondary | ICD-10-CM

## 2016-09-11 LAB — POCT URINALYSIS DIPSTICK
BILIRUBIN UA: NEGATIVE
Glucose, UA: 1000
KETONES UA: NEGATIVE
Nitrite, UA: POSITIVE
PH UA: 6
PROTEIN UA: NEGATIVE
RBC UA: 2
Urobilinogen, UA: NEGATIVE

## 2016-09-11 MED ORDER — CIPROFLOXACIN HCL 500 MG PO TABS: 500.0000 mg | ORAL_TABLET | Freq: Two times a day (BID) | ORAL | 0 refills | Status: DC

## 2016-09-11 MED ORDER — PHENAZOPYRIDINE HCL 100 MG PO TABS: 100.0000 mg | ORAL_TABLET | Freq: Three times a day (TID) | ORAL | 0 refills | Status: DC | PRN

## 2016-09-11 MED ORDER — TERCONAZOLE 0.4 % VA CREA: 1.0000 | TOPICAL_CREAM | Freq: Every day | VAGINAL | 0 refills | Status: DC

## 2016-09-11 NOTE — Progress Notes (Signed)
GYNECOLOGY  VISIT   HPI: 45 y.o. G3P2 Married Caucasian female with complaint of vaginal discharge with some odor that has been going on for several days.  She's also noted increased urinary frequency with increased odor in her urine.  She is feeling more urgency as well.  Has some low back pain but no flank pain.  Denies fever.  Denies hematuria.  Denies new sexual partner but requests STD testing today.    Upon asking, reports hemoglobin A1C has been higher, partly because she has not been taking her medication.  Has recently restarted and morning blood sugars are better.  Reports this morning's level was about 130.  Taking BS twice daily.  Was treated for UTI on 07/21/16.  Urine culture showed E Coli with several antibiotic resistances noted.  GYNECOLOGIC HISTORY: Patient's last menstrual period was 08/24/2016. Contraception: none Menopausal hormone therapy: none  Patient Active Problem List   Diagnosis Date Noted  . Diabetes mellitus 02/25/2011  . Hypertension 02/25/2011  . Hyperlipidemia 02/25/2011  . Depression 02/25/2011  . Generalized anxiety disorder 02/25/2011  . CONSTIPATION 02/22/2011    Past Medical History:  Diagnosis Date  . Chronic headache   . Colon polyps 03/28/11  . Depression   . Diabetes mellitus   . Diverticulosis   . GAD (generalized anxiety disorder)   . Gallstones   . Hyperlipidemia   . Migraines   . Obesity     Past Surgical History:  Procedure Laterality Date  . Opp  . CHOLECYSTECTOMY  1992  . RHINOPLASTY  1993  . TUBAL LIGATION Bilateral 1995    MEDS:  Reviewed in EPIC and UTD  ALLERGIES: Aspirin  Family History  Problem Relation Age of Onset  . Diabetes Maternal Grandmother     SH:  Married, non smoker  Review of Systems  Genitourinary: Positive for dysuria, frequency and urgency. Negative for flank pain and hematuria.  All other systems reviewed and are negative.   PHYSICAL EXAMINATION:    BP 110/70  (BP Location: Right Arm, Patient Position: Sitting, Cuff Size: Large)   Pulse 80   Temp 98.2 F (36.8 C) (Oral)   Resp 16   Ht 5' 4.5" (1.638 m)   Wt 283 lb (128.4 kg)   LMP 08/24/2016   BMI 47.83 kg/m     General appearance: alert, cooperative and appears stated age Abdomen: soft, non-tender; bowel sounds normal; no masses,  no organomegaly  Pelvic: External genitalia:  no lesions              Urethra:  normal appearing urethra with no masses, tenderness or lesions              Bartholins and Skenes: normal                 Vagina: normal vaginal tissue but adherent and clumpy whitish discharge noted, no lesions              Cervix: no lesions              Bimanual Exam:  Uterus:  normal size, contour, position, consistency, mobility, non-tender              Adnexa: no mass, fullness, tenderness              Anus:  normal sphincter tone, no lesions  Chaperone was present for exam.  Assessment: UTI Desires STD testing Diabetes, recent HBA1C was 9.0 Yeast vaginitis  Plan: Urine culture Ciprofloxin  500mg  bid x 3 day.  I am using this antibiotic as her insurance has a tier exception for bactrim and her last urine culture has multiple antibiotic resistance Terazol 7, one applicator nightly x 7 nights and can use externally twice daily GC/Chl, HIV, RPR, Hep B pending

## 2016-09-12 LAB — GC/CHLAMYDIA PROBE AMP
CT PROBE, AMP APTIMA: NOT DETECTED
GC Probe RNA: NOT DETECTED

## 2016-09-12 LAB — STD PANEL
HIV 1&2 Ab, 4th Generation: NONREACTIVE
Hepatitis B Surface Ag: NEGATIVE

## 2016-09-14 LAB — URINE CULTURE

## 2016-09-15 ENCOUNTER — Other Ambulatory Visit: Payer: Self-pay | Admitting: Obstetrics & Gynecology

## 2016-09-15 ENCOUNTER — Telehealth: Payer: Self-pay | Admitting: Obstetrics & Gynecology

## 2016-09-15 ENCOUNTER — Telehealth: Payer: Self-pay | Admitting: *Deleted

## 2016-09-15 DIAGNOSIS — R8290 Unspecified abnormal findings in urine: Secondary | ICD-10-CM

## 2016-09-15 MED ORDER — FLUCONAZOLE 150 MG PO TABS: 150.0000 mg | ORAL_TABLET | Freq: Once | ORAL | 0 refills | Status: AC

## 2016-09-15 NOTE — Telephone Encounter (Signed)
Karmen Bongo, RN spoke with patient. Please see next telephone encounter dated with today's date.  Will close encounter.

## 2016-09-15 NOTE — Telephone Encounter (Signed)
Rx for Diflucan 150mg  po x 1, repeat 72 hours.  #2/0RF.  This was sent to pharmacy on file.  Ok to close encounter.

## 2016-09-15 NOTE — Telephone Encounter (Signed)
Call to patient to let her know prescription for Diflucan had been sent in to [facility] in [place]. Patient verbalized understanding. Instructed patient on use and to call office back if symptoms do not improve. Patient agreeable.   Routing to provider for final review. Patient agreeable to disposition. Will close encounter.

## 2016-09-15 NOTE — Telephone Encounter (Signed)
-----   Message from Megan Salon, MD sent at 09/14/2016  5:32 PM EDT ----- Inform pt her urine culture showed e. Coli.  The ciprofloxin was a good choice for antibiotics.  I'd like her to have a repeat urine culture in two weeks.  Just can come for nurse visit.  Order placed.

## 2016-09-15 NOTE — Telephone Encounter (Signed)
Patient called and said, "I came in and saw Dr. Sabra Heck on 09/11/16 and she gave me some cream for a yeast infection but it's not working. Is there something else I can try?"  Pharmacy on file is correct.

## 2016-09-15 NOTE — Telephone Encounter (Signed)
Call to patient. Notified of urine culture results. Nurse visit for repeat urine culture scheduled for Friday 09/29/16 at 0900. Patient agreeable to date and time of appointment. Order placed for future lab for urine culture.   Patient also stating she was here on Monday for a yeast infection. Patient states she was given a cream and that the cream is not providing relief of her symptoms. Patient states diflucan has worked for her in the past and she wondered if she could try that instead. RN advised this message would be sent to Dr. Sabra Heck for review and our office would call with additional recommendations. Patient agreeable.  Routing to provider for review.

## 2016-09-29 ENCOUNTER — Ambulatory Visit (INDEPENDENT_AMBULATORY_CARE_PROVIDER_SITE_OTHER): Payer: Managed Care, Other (non HMO)

## 2016-09-29 DIAGNOSIS — R8290 Unspecified abnormal findings in urine: Secondary | ICD-10-CM | POA: Diagnosis not present

## 2016-09-29 NOTE — Progress Notes (Signed)
Patient here today for a repeat urine culture. Per patient, feeling much better. Urine obtained and culture drawn up and sent to the lab for resulting. Closing encounter.

## 2016-10-02 ENCOUNTER — Telehealth: Payer: Self-pay | Admitting: *Deleted

## 2016-10-02 DIAGNOSIS — R8290 Unspecified abnormal findings in urine: Secondary | ICD-10-CM

## 2016-10-02 LAB — URINE CULTURE

## 2016-10-02 MED ORDER — SULFAMETHOXAZOLE-TRIMETHOPRIM 800-160 MG PO TABS: 1.0000 | ORAL_TABLET | Freq: Two times a day (BID) | ORAL | 0 refills | Status: DC

## 2016-10-02 NOTE — Telephone Encounter (Signed)
Left detailed message per DPR with results as seen below from Dr. Sabra Heck. Advised patient to return call to office to schedule two week repeat urine culture. Future order placed for urine culture.

## 2016-10-02 NOTE — Telephone Encounter (Signed)
Correction to my directions:  That urine culture WAS her test of cure.  She needs bactrim DX bid x 7 days.  Then needs repeat culture in one week.  If still positive, will need urology referral.  Please send in rx for pt.  Thanks.

## 2016-10-02 NOTE — Telephone Encounter (Signed)
-----   Message from Megan Salon, MD sent at 10/02/2016 12:09 PM EDT ----- Please let pt know her urine culture was positive for e coli.  There were several antibiotics that it is resistant to but the cipro should work.  She needs a urine TOC in two weeks.  Order has not been placed.  Thanks.

## 2016-10-02 NOTE — Telephone Encounter (Signed)
Spoke with patient. Patient states she received message as seen below. Patient states it has been over 2 weeks since she had Cipro- started 09/11/16 for 3 days BID. Patient states she feels the Cipro was not enough. Patient states she is still having pain and urinary frequency. Patient states she is at work and will need a return call after 4:30pm 10/23 to schedule urine culture and to discuss antibiotics. Advised I would review with Dr. Sabra Heck and our office would return call after 4:30pm. Patient verbalizes understanding and is agreeable.

## 2016-10-02 NOTE — Telephone Encounter (Signed)
Spoke with patient. Advised of Dr. Ammie Ferrier recommendations as seen below. Patient scheduled for repeat urine culture 10/11/16 at 8:30am. Verified pharmacy on file. Patient verbalizes understanding and is agreeable.

## 2016-10-02 NOTE — Telephone Encounter (Signed)
Dr. Sabra Heck, to confirm order below, Bactrim DS?

## 2016-10-11 ENCOUNTER — Encounter: Payer: Self-pay | Admitting: Nurse Practitioner

## 2016-10-11 ENCOUNTER — Ambulatory Visit (INDEPENDENT_AMBULATORY_CARE_PROVIDER_SITE_OTHER): Payer: Managed Care, Other (non HMO) | Admitting: Nurse Practitioner

## 2016-10-11 VITALS — BP 112/70 | HR 68 | Resp 16 | Ht 64.5 in | Wt 283.0 lb

## 2016-10-11 DIAGNOSIS — R8290 Unspecified abnormal findings in urine: Secondary | ICD-10-CM | POA: Diagnosis not present

## 2016-10-11 DIAGNOSIS — N760 Acute vaginitis: Secondary | ICD-10-CM | POA: Diagnosis not present

## 2016-10-11 MED ORDER — FLUCONAZOLE 150 MG PO TABS: 150.0000 mg | ORAL_TABLET | Freq: Once | ORAL | 0 refills | Status: AC

## 2016-10-11 MED ORDER — NYSTATIN-TRIAMCINOLONE 100000-0.1 UNIT/GM-% EX OINT: 1.0000 "application " | TOPICAL_OINTMENT | Freq: Two times a day (BID) | CUTANEOUS | 0 refills | Status: DC

## 2016-10-11 NOTE — Patient Instructions (Signed)
Monilial Vaginitis Vaginitis in a soreness, swelling and redness (inflammation) of the vagina and vulva. Monilial vaginitis is not a sexually transmitted infection. CAUSES  Yeast vaginitis is caused by yeast (candida) that is normally found in your vagina. With a yeast infection, the candida has overgrown in number to a point that upsets the chemical balance. SYMPTOMS   White, thick vaginal discharge.  Swelling, itching, redness and irritation of the vagina and possibly the lips of the vagina (vulva).  Burning or painful urination.  Painful intercourse. DIAGNOSIS  Things that may contribute to monilial vaginitis are:  Postmenopausal and virginal states.  Pregnancy.  Infections.  Being tired, sick or stressed, especially if you had monilial vaginitis in the past.  Diabetes. Good control will help lower the chance.  Birth control pills.  Tight fitting garments.  Using bubble bath, feminine sprays, douches or deodorant tampons.  Taking certain medications that kill germs (antibiotics).  Sporadic recurrence can occur if you become ill. TREATMENT  Your caregiver will give you medication.  There are several kinds of anti monilial vaginal creams and suppositories specific for monilial vaginitis. For recurrent yeast infections, use a suppository or cream in the vagina 2 times a week, or as directed.  Anti-monilial or steroid cream for the itching or irritation of the vulva may also be used. Get your caregiver's permission.  Painting the vagina with methylene blue solution may help if the monilial cream does not work.  Eating yogurt may help prevent monilial vaginitis. HOME CARE INSTRUCTIONS   Finish all medication as prescribed.  Do not have sex until treatment is completed or after your caregiver tells you it is okay.  Take warm sitz baths.  Do not douche.  Do not use tampons, especially scented ones.  Wear cotton underwear.  Avoid tight pants and panty  hose.  Tell your sexual partner that you have a yeast infection. They should go to their caregiver if they have symptoms such as mild rash or itching.  Your sexual partner should be treated as well if your infection is difficult to eliminate.  Practice safer sex. Use condoms.  Some vaginal medications cause latex condoms to fail. Vaginal medications that harm condoms are:  Cleocin cream.  Butoconazole (Femstat).  Terconazole (Terazol) vaginal suppository.  Miconazole (Monistat) (may be purchased over the counter). SEEK MEDICAL CARE IF:   You have a temperature by mouth above 102 F (38.9 C).  The infection is getting worse after 2 days of treatment.  The infection is not getting better after 3 days of treatment.  You develop blisters in or around your vagina.  You develop vaginal bleeding, and it is not your menstrual period.  You have pain when you urinate.  You develop intestinal problems.  You have pain with sexual intercourse.   This information is not intended to replace advice given to you by your health care provider. Make sure you discuss any questions you have with your health care provider.   Document Released: 09/06/2005 Document Revised: 02/19/2012 Document Reviewed: 05/31/2015 Elsevier Interactive Patient Education 2016 [place] bath salts

## 2016-10-11 NOTE — Progress Notes (Signed)
Patient returned to clinic for a repeat urine culture. Patient complains of possibly having yeast infection. Vaginal irritation, white clumpy discharge. No vaginal odor. Still has urinary frequency. Placed patient on nurse practitioner's schedule.

## 2016-10-11 NOTE — Progress Notes (Signed)
45 y.o. Married Caucasian female G3P2 here with complaint of vaginal symptoms of itching, burning, and increase discharge. Describes discharge as white.  Onset of symptoms 2 days ago but now is very uncomfortable to wipe or sometimes just sitting down.   Denies new personal products or vaginal dryness.  No STD concerns. She has come in for [facility] for E coli UTI which was  treated with Bactrim on 10/02/16 . Still some urinary frequency and feels the dysuria is from vaginal symptoms.  Contraception is BTL.   O:  Healthy female WDWN Affect: normal, orientation x 3  Exam: Abdomen: Lymph node: no enlargement or tenderness Pelvic exam: External genital: normal female with extensive swelling, redness, and linear cuts all consistent with yeast BUS: negative Vagina: white discharge noted.  Affirm taken.   A: Yeast Vaginitis - following antibiotic therapy  TOC for E coli UTI  P: Discussed findings of vaginitis and etiology. Discussed Aveeno or baking soda sitz bath for comfort. Avoid moist clothes or pads for extended period of time. If working out in gym clothes or swim suits for long periods of time change underwear or bottoms of swimsuit if possible. Olive Oil/Coconut Oil use for skin protection prior to activity can be used to external skin.  Rx: Diflucan 150 mg X 2  RX: Triamcinolone cream BID for comfort  OTC Aveeno bath for comfort 2-3 times a day  Follow with Affirm  Note given for work due to the extent of discomfort and redness.  RV prn

## 2016-10-12 LAB — WET PREP BY MOLECULAR PROBE
CANDIDA SPECIES: POSITIVE — AB
GARDNERELLA VAGINALIS: NEGATIVE
TRICHOMONAS VAG: NEGATIVE

## 2016-10-12 LAB — URINE CULTURE

## 2016-10-15 NOTE — Progress Notes (Signed)
Encounter reviewed by Dr. Brook Amundson C. Silva.  

## 2016-10-17 ENCOUNTER — Telehealth: Payer: Self-pay | Admitting: *Deleted

## 2016-10-17 NOTE — Telephone Encounter (Signed)
-----   Message from Megan Salon, MD sent at 10/13/2016  4:27 PM EDT ----- Please inform pt that her urine culture was negative.  Please make sure symptoms are resolved.

## 2016-10-17 NOTE — Telephone Encounter (Signed)
Message left with results per DPR as seen below from Dr. Sabra Heck. Patient instructed to call Raquel Sarna back for an update about symptoms.

## 2016-10-18 NOTE — Telephone Encounter (Signed)
Pt returned call and details of call have been documented in result note of 10/11/16 Urine Culture.

## 2016-10-19 NOTE — Telephone Encounter (Signed)
Patient returning call. Advised as seen below per Dr. Ammie Ferrier request. Patient states she can not schedule an appt at this time due to other obligations. Patient states she is feeling better today with keeping area clean and dry. Patient states she will continue to monitor and will call Monday if appt still needed. Advised patient should symptoms worsen or not resolve, return call to office or seek care at local Urgent care/ER after hours. Patient verbalizes understanding and is agreeable.  Routing to provider for final review. Patient is agreeable to disposition. Will close encounter.   Cc: Lamont Snowball; Karmen Bongo

## 2016-10-19 NOTE — Telephone Encounter (Signed)
-----   Message from Megan Salon, MD sent at 10/18/2016  6:10 PM EST ----- Sending this to Anselm Pancoast, Pt needs OV.  I cannot make additional recommendations without looking at her skin.  Sorry.  MSM ----- Message ----- From: Graylon Good, CMA Sent: 10/18/2016  10:12 AM To: Megan Salon, MD  Return call from patient.  Patient states she is no longer having urinary symptoms.  She continues to have itching and burning externally, especially in perineum area.  Patient completed Diflucan.  She has tried [employer] which helped, and has been using Mycolog cream which she states makes the itching worse.  Patient unable to come for office visit at this time.  Please advise.

## 2016-10-19 NOTE — Telephone Encounter (Signed)
I have attempted to contact this patient by phone with the following results: left message to return call to Monon at [phone] on answering machine (mobile per [facility]). Name verified in [place], advised call was to follow up our conversation on 10/18/16.  [phone] (Mobile) *Preferred*

## 2017-01-09 ENCOUNTER — Ambulatory Visit: Payer: Managed Care, Other (non HMO) | Admitting: Obstetrics & Gynecology

## 2018-01-11 ENCOUNTER — Encounter: Payer: Self-pay | Admitting: Gastroenterology

## 2018-03-05 ENCOUNTER — Ambulatory Visit (AMBULATORY_SURGERY_CENTER): Payer: Self-pay | Admitting: *Deleted

## 2018-03-05 ENCOUNTER — Other Ambulatory Visit: Payer: Self-pay

## 2018-03-05 VITALS — Ht 64.0 in | Wt 278.0 lb

## 2018-03-05 DIAGNOSIS — Z86010 Personal history of colonic polyps: Secondary | ICD-10-CM

## 2018-03-05 MED ORDER — PEG 3350-KCL-NA BICARB-NACL 420 G PO SOLR: 4000.0000 mL | Freq: Once | ORAL | 0 refills | Status: AC

## 2018-03-05 NOTE — Progress Notes (Signed)
No egg or soy allergy known to patient  No issues with past sedation with any surgeries  or procedures, no intubation problems  No diet pills per patient No home 02 use per patient  No blood thinners per patient  Pt denies issues with constipation  No A fib or A flutter  EMMI video sent to pt's e mail  

## 2018-03-19 ENCOUNTER — Ambulatory Visit (AMBULATORY_SURGERY_CENTER): Payer: PRIVATE HEALTH INSURANCE | Admitting: Gastroenterology

## 2018-03-19 ENCOUNTER — Encounter: Payer: Self-pay | Admitting: Gastroenterology

## 2018-03-19 ENCOUNTER — Other Ambulatory Visit: Payer: Self-pay

## 2018-03-19 VITALS — BP 115/62 | HR 80 | Temp 97.5°F | Resp 11 | Ht 64.0 in | Wt 283.0 lb

## 2018-03-19 DIAGNOSIS — D125 Benign neoplasm of sigmoid colon: Secondary | ICD-10-CM

## 2018-03-19 DIAGNOSIS — Z860100 Personal history of colon polyps, unspecified: Secondary | ICD-10-CM

## 2018-03-19 DIAGNOSIS — K5730 Diverticulosis of large intestine without perforation or abscess without bleeding: Secondary | ICD-10-CM | POA: Diagnosis not present

## 2018-03-19 DIAGNOSIS — D122 Benign neoplasm of ascending colon: Secondary | ICD-10-CM | POA: Diagnosis not present

## 2018-03-19 MED ORDER — SODIUM CHLORIDE 0.9 % IV SOLN: 500.0000 mL | Freq: Once | INTRAVENOUS | Status: DC

## 2018-03-19 NOTE — Op Note (Signed)
[facility] Patient Name: Kelli [name] Procedure Date: 03/19/2018 8:51 AM MRN: [id number] Endoscopist: Milus Banister , MD Age: 47 Referring MD:  Date of Birth: 29-Oct-1971 Gender: Female Account #: [id number] Procedure:                Colonoscopy Indications:              High risk colon cancer surveillance: Personal                            history of colonic polyps; colonoscopy 2012, single                            subCM adenoma Medicines:                Monitored Anesthesia Care Procedure:                Pre-Anesthesia Assessment:                           - Prior to the procedure, a History and Physical                            was performed, and patient medications and                            allergies were reviewed. The patient's tolerance of                            previous anesthesia was also reviewed. The risks                            and benefits of the procedure and the sedation                            options and risks were discussed with the patient.                            All questions were answered, and informed consent                            was obtained. Prior Anticoagulants: The patient has                            taken no previous anticoagulant or antiplatelet                            agents. ASA Grade Assessment: II - A patient with                            mild systemic disease. After reviewing the risks                            and benefits, the patient was deemed in  satisfactory condition to undergo the procedure.                           After obtaining informed consent, the colonoscope                            was passed under direct vision. Throughout the                            procedure, the patient's blood pressure, pulse, and                            oxygen saturations were monitored continuously. The                            Model PCF-H190DL [id number]) scope was  introduced                            through the anus and advanced to the the cecum,                            identified by appendiceal orifice and ileocecal                            valve. The colonoscopy was performed without                            difficulty. The patient tolerated the procedure                            well. The quality of the bowel preparation was                            good. The ileocecal valve, appendiceal orifice, and                            rectum were photographed. Scope In: 9:02:36 AM Scope Out: 9:19:20 AM Scope Withdrawal Time: 0 hours 13 minutes 15 seconds  Total Procedure Duration: 0 hours 16 minutes 44 seconds  Findings:                 Two sessile polyps were found in the sigmoid colon                            and ascending colon. The polyps were 2 to 3 mm in                            size. These polyps were removed with a cold snare.                            Resection and retrieval were complete.                           Multiple small-mouthed diverticula were found in  the left colon.                           The exam was otherwise without abnormality on                            direct and retroflexion views. Complications:            No immediate complications. Estimated blood loss:                            None. Estimated Blood Loss:     Estimated blood loss: none. Impression:               - Two 2 to 3 mm polyps in the sigmoid colon and in                            the ascending colon, removed with a cold snare.                            Resected and retrieved.                           - Diverticulosis in the left colon.                           - The examination was otherwise normal on direct                            and retroflexion views. Recommendation:           - Patient has a contact number available for                            emergencies. The signs and symptoms of potential                             delayed complications were discussed with the                            patient. Return to normal activities tomorrow.                            Written discharge instructions were provided to the                            patient.                           - Resume previous diet.                           - Continue present medications.                           You will receive a letter within 2-3 weeks with the  pathology results and my final recommendations.                           If the polyp(s) is proven to be 'pre-cancerous' on                            pathology, you will need repeat colonoscopy in 5                            years. If the polyp(s) is NOT 'precancerous' on                            pathology then you should repeat colon cancer                            screening in 10 years with colonoscopy without need                            for colon cancer screening by any method prior to                            then (including stool testing). Milus Banister, MD 03/19/2018 9:21:55 AM This report has been signed electronically.

## 2018-03-19 NOTE — Progress Notes (Signed)
To PACU, VSS. Report to [email or web address] 

## 2018-03-19 NOTE — Progress Notes (Signed)
Pt's states no medical or surgical changes since previsit or office visit. 

## 2018-03-19 NOTE — Progress Notes (Signed)
Called to room to assist during endoscopic procedure.  Patient ID and intended procedure confirmed with present staff. Received instructions for my participation in the procedure from the performing physician.  

## 2018-03-19 NOTE — Patient Instructions (Signed)
YOU HAD AN ENDOSCOPIC PROCEDURE TODAY AT [facility]:   Refer to the procedure report that was given to you for any specific questions about what was found during the examination.  If the procedure report does not answer your questions, please call your gastroenterologist to clarify.  If you requested that your care partner not be given the details of your procedure findings, then the procedure report has been included in a sealed envelope for you to review at your convenience later.  YOU SHOULD EXPECT: Some feelings of bloating in the abdomen. Passage of more gas than usual.  Walking can help get rid of the air that was put into your GI tract during the procedure and reduce the bloating. If you had a lower endoscopy (such as a colonoscopy or flexible sigmoidoscopy) you may notice spotting of blood in your stool or on the toilet paper. If you underwent a bowel prep for your procedure, you may not have a normal bowel movement for a few days.  Please Note:  You might notice some irritation and congestion in your nose or some drainage.  This is from the oxygen used during your procedure.  There is no need for concern and it should clear up in a day or so.  SYMPTOMS TO REPORT IMMEDIATELY:   Following lower endoscopy (colonoscopy or flexible sigmoidoscopy):  Excessive amounts of blood in the stool  Significant tenderness or worsening of abdominal pains  Swelling of the abdomen that is new, acute  Fever of 100F or higher   For urgent or emergent issues, a gastroenterologist can be reached at any hour by calling [phone].   DIET:  We do recommend a small meal at first, but then you may proceed to your regular diet.  Drink plenty of fluids but you should avoid alcoholic beverages for 24 hours. Try to increase the fiber in your diet, and drink plenty of water.  ACTIVITY:  You should plan to take it easy for the rest of today and you should NOT DRIVE or use heavy machinery until  tomorrow (because of the sedation medicines used during the test).    FOLLOW UP: Our staff will call the number listed on your records the next business day following your procedure to check on you and address any questions or concerns that you may have regarding the information given to you following your procedure. If we do not reach you, we will leave a message.  However, if you are feeling well and you are not experiencing any problems, there is no need to return our call.  We will assume that you have returned to your regular daily activities without incident.  If any biopsies were taken you will be contacted by phone or by letter within the next 1-3 weeks.  Please call us at [phone] if you have not heard about the biopsies in 3 weeks.    SIGNATURES/CONFIDENTIALITY: You and/or your care partner have signed paperwork which will be entered into your electronic medical record.  These signatures attest to the fact that that the information above on your After Visit Summary has been reviewed and is understood.  Full responsibility of the confidentiality of this discharge information lies with you and/or your care-partner.  Read al handouts given to you by your recovery room nurse.

## 2018-03-20 ENCOUNTER — Telehealth: Payer: Self-pay

## 2018-03-20 ENCOUNTER — Telehealth: Payer: Self-pay | Admitting: *Deleted

## 2018-03-20 NOTE — Telephone Encounter (Signed)
NO ANSWER, MESSAGE LEFT FOR PATIENT. 

## 2018-03-20 NOTE — Telephone Encounter (Signed)
  Follow up Call-  Call back number 03/19/2018  Post procedure Call Back phone  # [phone]  Permission to leave phone message Yes  Some recent data might be hidden     Patient questions:  Do you have a fever, pain , or abdominal swelling? No. Pain Score  0 *  Have you tolerated food without any problems? Yes.    Have you been able to return to your normal activities? Yes.    Do you have any questions about your discharge instructions: Diet   No. Medications  No. Follow up visit  No.  Do you have questions or concerns about your Care? No.  Actions: * If pain score is 4 or above: No action needed, pain <4.

## 2018-03-20 NOTE — Telephone Encounter (Signed)
Pt returned your call and would like a call back. She stated that she is having some sxs. Thank you

## 2018-03-25 ENCOUNTER — Encounter: Payer: Self-pay | Admitting: Gastroenterology

## 2018-07-22 ENCOUNTER — Other Ambulatory Visit: Payer: Self-pay

## 2018-07-22 ENCOUNTER — Emergency Department (HOSPITAL_COMMUNITY)
Admission: EM | Admit: 2018-07-22 | Discharge: 2018-07-22 | Disposition: A | Discharge status: Home / Self Care | Payer: PRIVATE HEALTH INSURANCE | Attending: Emergency Medicine | Admitting: Emergency Medicine

## 2018-07-22 ENCOUNTER — Emergency Department (HOSPITAL_COMMUNITY): Payer: PRIVATE HEALTH INSURANCE

## 2018-07-22 ENCOUNTER — Encounter (HOSPITAL_COMMUNITY): Payer: Self-pay | Admitting: Emergency Medicine

## 2018-07-22 DIAGNOSIS — I10 Essential (primary) hypertension: Secondary | ICD-10-CM | POA: Diagnosis not present

## 2018-07-22 DIAGNOSIS — E785 Hyperlipidemia, unspecified: Secondary | ICD-10-CM | POA: Diagnosis not present

## 2018-07-22 DIAGNOSIS — E119 Type 2 diabetes mellitus without complications: Secondary | ICD-10-CM | POA: Diagnosis not present

## 2018-07-22 DIAGNOSIS — Y998 Other external cause status: Secondary | ICD-10-CM | POA: Diagnosis not present

## 2018-07-22 DIAGNOSIS — F17200 Nicotine dependence, unspecified, uncomplicated: Secondary | ICD-10-CM | POA: Diagnosis not present

## 2018-07-22 DIAGNOSIS — Y939 Activity, unspecified: Secondary | ICD-10-CM | POA: Diagnosis not present

## 2018-07-22 DIAGNOSIS — Y929 Unspecified place or not applicable: Secondary | ICD-10-CM | POA: Insufficient documentation

## 2018-07-22 DIAGNOSIS — S4992XA Unspecified injury of left shoulder and upper arm, initial encounter: Secondary | ICD-10-CM | POA: Insufficient documentation

## 2018-07-22 DIAGNOSIS — Z79899 Other long term (current) drug therapy: Secondary | ICD-10-CM | POA: Insufficient documentation

## 2018-07-22 DIAGNOSIS — W010XXA Fall on same level from slipping, tripping and stumbling without subsequent striking against object, initial encounter: Secondary | ICD-10-CM | POA: Insufficient documentation

## 2018-07-22 DIAGNOSIS — Z7984 Long term (current) use of oral hypoglycemic drugs: Secondary | ICD-10-CM | POA: Diagnosis not present

## 2018-07-22 DIAGNOSIS — S4990XA Unspecified injury of shoulder and upper arm, unspecified arm, initial encounter: Secondary | ICD-10-CM

## 2018-07-22 MED ORDER — TRAMADOL HCL 50 MG PO TABS: 50.0000 mg | ORAL_TABLET | Freq: Four times a day (QID) | ORAL | 0 refills | Status: DC | PRN

## 2018-07-22 MED ORDER — IBUPROFEN 800 MG PO TABS: 800.0000 mg | ORAL_TABLET | Freq: Three times a day (TID) | ORAL | 0 refills | Status: DC

## 2018-07-22 NOTE — Discharge Instructions (Addendum)
Apply ice packs on/off to your shoulder.  Call Dr. Ruthe Mannan office to arrange a follow-up appt.

## 2018-07-22 NOTE — ED Triage Notes (Signed)
Patient states she tripped and fell on Friday and is complaining of left shoulder pain.

## 2018-07-23 NOTE — ED Provider Notes (Signed)
[facility] EMERGENCY DEPARTMENT Provider Note   CSN: [id number] Arrival date & time: 07/22/18  1621     History   Chief Complaint Chief Complaint  Patient presents with  . Fall    HPI Kelli [name] is a 47 y.o. female.  HPI  Kelli [name] is a 47 y.o. female who presents to the Emergency Department complaining of left shoulder pain for 3 days.  She describes a forward fall in which she landed on her left shoulder.  She reports bruising and difficulty with movement of her shoulder.  She denies neck pain, head injury, LOC, visual changes, numbness or weakness of the upper extremities.  Pain is worse with movement proves holding the shoulder immobile.   Past Medical History:  Diagnosis Date  . Allergy   . Anemia   . Arthritis    knee  . Chronic headache   . Colon polyps 03/28/11  . Depression   . Diabetes mellitus   . Diverticulosis   . GAD (generalized anxiety disorder)   . Gallstones   . GERD (gastroesophageal reflux disease)   . Heart murmur   . Hyperlipidemia   . Migraines   . Obesity     Patient Active Problem List   Diagnosis Date Noted  . Diabetes mellitus 02/25/2011  . Hypertension 02/25/2011  . Hyperlipidemia 02/25/2011  . Depression 02/25/2011  . Generalized anxiety disorder 02/25/2011  . CONSTIPATION 02/22/2011    Past Surgical History:  Procedure Laterality Date  . [place]  . CHOLECYSTECTOMY  1992  . COLONOSCOPY    . POLYPECTOMY    . RHINOPLASTY  1993  . TUBAL LIGATION Bilateral 1995     OB History    Gravida  3   Para  2   Term      Preterm      AB      Living  2     SAB      TAB      Ectopic      Multiple      Live Births               Home Medications    Prior to Admission medications   Medication Sig Start Date End Date Taking? Authorizing Provider  ARIPiprazole (ABILIFY) 2 MG tablet Take 2 mg by mouth daily. 08/21/16   [provider]  buPROPion (WELLBUTRIN XL) 300 MG 24  hr tablet Take 300 mg by mouth daily.      [provider]  cetirizine (ZYRTEC) 10 MG tablet Take 10 mg by mouth daily.    [provider]  FARXIGA 5 MG TABS tablet Take 5 mg by mouth daily. 08/21/16   [provider]  glimepiride (AMARYL) 2 MG tablet Take 2 mg by mouth daily. 09/09/15   [provider]  ibuprofen (ADVIL,MOTRIN) 800 MG tablet Take 1 tablet (800 mg total) by mouth 3 (three) times daily. Take with food 07/22/18   Janean Eischen, PA-C  Liraglutide (VICTOZA) 18 MG/3ML SOLN Inject 1.8 mg into the skin daily.     [provider]  Multiple Vitamin (MULTIVITAMIN WITH MINERALS) TABS tablet Take 1 tablet by mouth daily.    [provider]  nystatin-triamcinolone ointment (MYCOLOG) Apply 1 application topically 2 (two) times daily. Patient not taking: Reported on 03/19/2018 10/11/16   Kem Boroughs, FNP  OZEMPIC 1 MG/DOSE [facility]  01/17/18   [provider]  phenazopyridine (PYRIDIUM) 100 MG tablet Take 1 tablet (100 mg  total) by mouth 3 (three) times daily as needed for pain. 09/11/16   Megan Salon, MD  terconazole (TERAZOL 7) 0.4 % vaginal cream Place 1 applicator vaginally at bedtime. One applicator full QHS for seven days of therapy 09/11/16   Megan Salon, MD  traMADol (ULTRAM) 50 MG tablet Take 1 tablet (50 mg total) by mouth every 6 (six) hours as needed. 07/22/18   Kem Parkinson, PA-C    Family History Family History  Problem Relation Age of Onset  . Diabetes Maternal Grandmother   . Colon polyps Mother   . Colon cancer Neg Hx   . Rectal cancer Neg Hx   . Stomach cancer Neg Hx     Social History Social History   Tobacco Use  . Smoking status: Current Every Day Smoker    Packs/day: 0.75    Years: 1.00    Pack years: 0.75    Last attempt to quit: 10/04/2015    Years since quitting: 2.8  . Smokeless tobacco: Never Used  Substance Use Topics  . Alcohol use: Yes    Comment: occasionally  . Drug use: No      Allergies   Aspirin and Neosporin [neomycin-bacitracin zn-polymyx]   Review of Systems Review of Systems  Constitutional: Negative for chills and fever.  Respiratory: Negative for chest tightness and shortness of breath.   Cardiovascular: Negative for chest pain.  Gastrointestinal: Negative for abdominal pain, nausea and vomiting.  Genitourinary: Negative for dysuria and flank pain.  Musculoskeletal: Positive for arthralgias (Left shoulder pain) and joint swelling. Negative for back pain and neck pain.  Skin: Negative for wound.  Neurological: Negative for dizziness, syncope, weakness, numbness and headaches.  All other systems reviewed and are negative.    Physical Exam Updated Vital Signs BP 131/74   Pulse 88   Temp 98.2 F (36.8 C) (Oral)   Resp 16   Ht 5' 4.5" (1.638 m)   Wt 118.8 kg   LMP 07/21/2018   SpO2 98%   BMI 44.28 kg/m   Physical Exam  Constitutional: She is oriented to person, place, and time. She appears well-developed and well-nourished. No distress.  HENT:  Head: Atraumatic.  Neck: Normal range of motion. Neck supple.  Cardiovascular: Normal rate, regular rhythm and intact distal pulses.  No murmur heard. Radial pulses strong and palpable bilaterally  Pulmonary/Chest: Effort normal and breath sounds normal. No respiratory distress. She exhibits no tenderness.  Musculoskeletal: She exhibits tenderness. She exhibits no edema or deformity.  ttp of the anterior left shoulder.  Localized area of ecchymosis over the anterior shoulder.  Pain reproduced on abduction.  Grip strength is 5/5 and symmetrical.   No abrasions, edema , erythema or step-off deformity of the joint.   Lymphadenopathy:    She has no cervical adenopathy.  Neurological: She is alert and oriented to person, place, and time. She has normal strength. No sensory deficit. She exhibits normal muscle tone. Coordination normal.  Reflex Scores:      Tricep reflexes are 1+ on the right side  and 2+ on the left side.      Bicep reflexes are 1+ on the right side and 2+ on the left side. Skin: Skin is warm. Capillary refill takes less than 2 seconds. No rash noted.  Psychiatric: She has a normal mood and affect.  Nursing note and vitals reviewed.    ED Treatments / Results  Labs (all labs ordered are listed, but only abnormal results are displayed) Labs Reviewed -  No data to display  EKG None  Radiology Dg Shoulder Left  Result Date: 07/22/2018 CLINICAL DATA:  Left shoulder pain after fall 3 days ago. EXAM: LEFT SHOULDER - 2+ VIEW COMPARISON:  None. FINDINGS: There is no evidence of fracture or dislocation. There is no evidence of arthropathy or other focal bone abnormality. Soft tissues are unremarkable. IMPRESSION: Normal left shoulder. Electronically Signed   By: Marijo Conception, M.D.   On: 07/22/2018 15:07    Procedures Procedures (including critical care time)  Medications Ordered in ED Medications - No data to display   Initial Impression / Assessment and Plan / ED Course  I have reviewed the triage vital signs and the nursing notes.  Pertinent labs & imaging results that were available during my care of the patient were reviewed by me and considered in my medical decision making (see chart for details).     X-ray results reviewed and discussed with patient.  Negative for fracture or dislocation.  She is neurovascularly intact.  Compartments are soft.  Injury concerning for possible rotator cuff versus labral injury.  Patient agrees to rest, ice, and close orthopedic follow-up.  Return precautions were discussed.  Final Clinical Impressions(s) / ED Diagnoses   Final diagnoses:  Shoulder injury, initial encounter    ED Discharge Orders         Ordered    ibuprofen (ADVIL,MOTRIN) 800 MG tablet  3 times daily     07/22/18 1656    traMADol (ULTRAM) 50 MG tablet  Every 6 hours PRN     07/22/18 1656           Kem Parkinson, PA-C 07/23/18 1804     Milton Ferguson, MD 07/25/18 1228

## 2018-07-31 ENCOUNTER — Encounter: Payer: Self-pay | Admitting: Orthopaedic Surgery

## 2018-07-31 ENCOUNTER — Ambulatory Visit: Payer: PRIVATE HEALTH INSURANCE | Admitting: Orthopaedic Surgery

## 2018-07-31 VITALS — BP 119/73 | HR 86 | Ht 64.5 in | Wt 264.0 lb

## 2018-07-31 DIAGNOSIS — M25512 Pain in left shoulder: Secondary | ICD-10-CM

## 2018-07-31 DIAGNOSIS — F17210 Nicotine dependence, cigarettes, uncomplicated: Secondary | ICD-10-CM | POA: Diagnosis not present

## 2018-07-31 DIAGNOSIS — Z6841 Body Mass Index (BMI) 40.0 and over, adult: Secondary | ICD-10-CM | POA: Diagnosis not present

## 2018-07-31 MED ORDER — HYDROCODONE-ACETAMINOPHEN 5-325 MG PO TABS: 1.0000 | ORAL_TABLET | ORAL | 0 refills | Status: AC | PRN

## 2018-07-31 MED ORDER — NAPROXEN 500 MG PO TABS: 500.0000 mg | ORAL_TABLET | Freq: Two times a day (BID) | ORAL | 5 refills | Status: DC

## 2018-07-31 NOTE — Progress Notes (Signed)
Subjective:    Patient ID: Kelli [name], female    DOB: 26-Jun-1971, 47 y.o.   MRN: [id number]  HPI She fell at home and hurt her left shoulder on 07-19-18.  She had shoulder pain afterwards. She used ice and heat and Advil with little help.  She went to the ER on 07-29-18. X-rays were done and were negative.  She was given ibuprofen 800 and Tramadol.  They have not helped.  She continues to have pain and inability to raise her arm over head and also pain when rolling over on it at night.  She has no numbness, no swelling.   Review of Systems  Constitutional: Positive for activity change.  Musculoskeletal: Positive for arthralgias.  Neurological: Positive for headaches.  All other systems reviewed and are negative.  For Review of Systems, all other systems reviewed and are negative.  The following is a summary of the past history medically, past history surgically, known current medicines, social history and family history.  This information is gathered electronically by the computer from prior information and documentation.  I review this each visit and have found including this information at this point in the chart is beneficial and informative.   Past Medical History:  Diagnosis Date  . Allergy   . Anemia   . Arthritis    knee  . Chronic headache   . Colon polyps 03/28/11  . Depression   . Diabetes mellitus   . Diverticulosis   . GAD (generalized anxiety disorder)   . Gallstones   . GERD (gastroesophageal reflux disease)   . Heart murmur   . Hyperlipidemia   . Migraines   . Obesity     Past Surgical History:  Procedure Laterality Date  . [place]  . CHOLECYSTECTOMY  1992  . COLONOSCOPY    . POLYPECTOMY    . RHINOPLASTY  1993  . TUBAL LIGATION Bilateral 1995    Current Outpatient Medications on File Prior to Visit  Medication Sig Dispense Refill  . ARIPiprazole (ABILIFY) 2 MG tablet Take 2 mg by mouth daily.    Marland Kitchen buPROPion (WELLBUTRIN XL) 300  MG 24 hr tablet Take 300 mg by mouth daily.      . cetirizine (ZYRTEC) 10 MG tablet Take 10 mg by mouth daily.    Marland Kitchen FARXIGA 5 MG TABS tablet Take 5 mg by mouth daily.    Marland Kitchen glimepiride (AMARYL) 2 MG tablet Take 2 mg by mouth daily.    . Liraglutide (VICTOZA) 18 MG/3ML SOLN Inject 1.8 mg into the skin daily.     . Multiple Vitamin (MULTIVITAMIN WITH MINERALS) TABS tablet Take 1 tablet by mouth daily.    Marland Kitchen nystatin-triamcinolone ointment (MYCOLOG) Apply 1 application topically 2 (two) times daily. 30 g 0  . OZEMPIC 1 MG/DOSE SOPN     . phenazopyridine (PYRIDIUM) 100 MG tablet Take 1 tablet (100 mg total) by mouth 3 (three) times daily as needed for pain. 15 tablet 0  . terconazole (TERAZOL 7) 0.4 % vaginal cream Place 1 applicator vaginally at bedtime. One applicator full QHS for seven days of therapy 45 g 0   Current Facility-Administered Medications on File Prior to Visit  Medication Dose Route Frequency Provider Last Rate Last Dose  . 0.9 %  sodium chloride infusion  500 mL Intravenous Once Milus Banister, MD        Social History   Socioeconomic History  . Marital status: Married    Spouse name:  Not on file  . Number of children: Not on file  . Years of education: Not on file  . Highest education level: Not on file  Occupational History  . Not on file  Social Needs  . Financial resource strain: Not on file  . Food insecurity:    Worry: Not on file    Inability: Not on file  . Transportation needs:    Medical: Not on file    Non-medical: Not on file  Tobacco Use  . Smoking status: Current Every Day Smoker    Packs/day: 0.75    Years: 1.00    Pack years: 0.75    Last attempt to quit: 10/04/2015    Years since quitting: 2.8  . Smokeless tobacco: Never Used  Substance and Sexual Activity  . Alcohol use: Yes    Comment: occasionally  . Drug use: No  . Sexual activity: Yes    Partners: Male    Birth control/protection: Surgical, Condom    Comment: BTL  Lifestyle  .  Physical activity:    Days per week: Not on file    Minutes per session: Not on file  . Stress: Not on file  Relationships  . Social connections:    Talks on phone: Not on file    Gets together: Not on file    Attends religious service: Not on file    Active member of club or organization: Not on file    Attends meetings of clubs or organizations: Not on file    Relationship status: Not on file  . Intimate partner violence:    Fear of current or ex partner: Not on file    Emotionally abused: Not on file    Physically abused: Not on file    Forced sexual activity: Not on file  Other Topics Concern  . Not on file  Social History Narrative  . Not on file    Family History  Problem Relation Age of Onset  . Diabetes Maternal Grandmother   . Colon polyps Mother   . Colon cancer Neg Hx   . Rectal cancer Neg Hx   . Stomach cancer Neg Hx     BP 119/73   Pulse 86   Ht 5' 4.5" (1.638 m)   Wt 264 lb (119.7 kg)   LMP 07/21/2018   BMI 44.62 kg/m   Body mass index is 44.62 kg/m.      Objective:   Physical Exam  Constitutional: She is oriented to person, place, and time. She appears well-developed and well-nourished.  HENT:  Head: Normocephalic and atraumatic.  Eyes: Pupils are equal, round, and reactive to light. Conjunctivae and EOM are normal.  Neck: Normal range of motion. Neck supple.  Cardiovascular: Normal rate, regular rhythm and intact distal pulses.  Pulmonary/Chest: Effort normal.  Abdominal: Soft.  Musculoskeletal:       Left shoulder: She exhibits decreased range of motion, tenderness and pain.       Arms: Neurological: She is alert and oriented to person, place, and time. She has normal reflexes. She displays normal reflexes. No cranial nerve deficit. She exhibits normal muscle tone. Coordination normal.  Skin: Skin is warm and dry.  Psychiatric: She has a normal mood and affect. Her behavior is normal. Judgment and thought content normal.    I have  reviewed the ER record and x-rays and xray report.  The patient meets the AMA guidelines for Morbid (severe) obesity with a BMI > 40.0 and I have recommended weight  loss.    Assessment & Plan:   Encounter Diagnoses  Name Primary?  . Pain in joint of left shoulder Yes  . Body mass index 40.0-44.9, adult ([place])   . Morbid obesity ([place])   . Cigarette nicotine dependence without complication    I have recommended PT/OT.  I have told her to stop the ibuprofen and Tramadol.  I have changed to Naprosyn and Norco.  I have reviewed the [place] web site prior to prescribing narcotic medicine for this patient.  PROCEDURE NOTE:  The patient request injection, verbal consent was obtained.  The left shoulder was prepped appropriately after time out was performed.   Sterile technique was observed and injection of 1 cc of Depo-Medrol 40 mg with several cc's of plain xylocaine. Anesthesia was provided by ethyl chloride and a 20-gauge needle was used to inject the shoulder area. A posterior approach was used.  The injection was tolerated well.  A band aid dressing was applied.  The patient was advised to apply ice later today and tomorrow to the injection sight as needed.  I will see her in two weeks.  Call if any problem.  Precautions discussed.   Electronically Signed Sanjuana Kava, MD 8/21/20192:06 PM

## 2018-07-31 NOTE — Patient Instructions (Signed)
Steps to Quit Smoking Smoking tobacco can be bad for your health. It can also affect almost every organ in your body. Smoking puts you and people around you at risk for many serious long-lasting (chronic) diseases. Quitting smoking is hard, but it is one of the best things that you can do for your health. It is never too late to quit. What are the benefits of quitting smoking? When you quit smoking, you lower your risk for getting serious diseases and conditions. They can include:  Lung cancer or lung disease.  Heart disease.  Stroke.  Heart attack.  Not being able to have children (infertility).  Weak bones (osteoporosis) and broken bones (fractures).  If you have coughing, wheezing, and shortness of breath, those symptoms may get better when you quit. You may also get sick less often. If you are pregnant, quitting smoking can help to lower your chances of having a baby of low birth weight. What can I do to help me quit smoking? Talk with your doctor about what can help you quit smoking. Some things you can do (strategies) include:  Quitting smoking totally, instead of slowly cutting back how much you smoke over a period of time.  Going to in-person counseling. You are more likely to quit if you go to many counseling sessions.  Using resources and support systems, such as: ? Online chats with a counselor. ? Phone quitlines. ? Printed self-help materials. ? Support groups or group counseling. ? Text messaging programs. ? Mobile phone apps or applications.  Taking medicines. Some of these medicines may have nicotine in them. If you are pregnant or breastfeeding, do not take any medicines to quit smoking unless your doctor says it is okay. Talk with your doctor about counseling or other things that can help you.  Talk with your doctor about using more than one strategy at the same time, such as taking medicines while you are also going to in-person counseling. This can help make  quitting easier. What things can I do to make it easier to quit? Quitting smoking might feel very hard at first, but there is a lot that you can do to make it easier. Take these steps:  Talk to your family and friends. Ask them to support and encourage you.  Call phone quitlines, reach out to support groups, or work with a counselor.  Ask people who smoke to not smoke around you.  Avoid places that make you want (trigger) to smoke, such as: ? Bars. ? Parties. ? Smoke-break areas at work.  Spend time with people who do not smoke.  Lower the stress in your life. Stress can make you want to smoke. Try these things to help your stress: ? Getting regular exercise. ? Deep-breathing exercises. ? Yoga. ? Meditating. ? Doing a body scan. To do this, close your eyes, focus on one area of your body at a time from head to toe, and notice which parts of your body are tense. Try to relax the muscles in those areas.  Download or buy apps on your mobile phone or tablet that can help you stick to your quit plan. There are many free apps, such as QuitGuide from the CDC (Centers for Disease Control and Prevention). You can find more support from smokefree.gov and other websites.  This information is not intended to replace advice given to you by your health care provider. Make sure you discuss any questions you have with your health care provider. Document Released: 09/23/2009 Document   Revised: 07/25/2016 Document Reviewed: 04/13/2015 Elsevier Interactive Patient Education  2018 [employer] Inc.  

## 2018-08-06 ENCOUNTER — Ambulatory Visit: Payer: Managed Care, Other (non HMO) | Admitting: Orthopaedic Surgery

## 2018-08-06 ENCOUNTER — Telehealth: Payer: Self-pay | Admitting: Orthopaedic Surgery

## 2018-08-06 MED ORDER — HYDROCODONE-ACETAMINOPHEN 5-325 MG PO TABS: ORAL_TABLET | ORAL | 0 refills | Status: DC

## 2018-08-06 NOTE — Telephone Encounter (Signed)
Hydrocodone-Acetaminophen 5/325 mg  Qty 30 Tablets  PATIENT USES [facility]

## 2018-08-20 ENCOUNTER — Ambulatory Visit: Payer: PRIVATE HEALTH INSURANCE | Admitting: Orthopaedic Surgery

## 2018-08-20 ENCOUNTER — Encounter: Payer: Self-pay | Admitting: Orthopaedic Surgery

## 2018-08-20 VITALS — BP 124/76 | HR 86 | Ht 64.5 in | Wt 261.0 lb

## 2018-08-20 DIAGNOSIS — F17210 Nicotine dependence, cigarettes, uncomplicated: Secondary | ICD-10-CM | POA: Diagnosis not present

## 2018-08-20 DIAGNOSIS — Z6841 Body Mass Index (BMI) 40.0 and over, adult: Secondary | ICD-10-CM | POA: Diagnosis not present

## 2018-08-20 DIAGNOSIS — M25512 Pain in left shoulder: Secondary | ICD-10-CM

## 2018-08-20 MED ORDER — HYDROCODONE-ACETAMINOPHEN 5-325 MG PO TABS: ORAL_TABLET | ORAL | 0 refills | Status: DC

## 2018-08-20 NOTE — Progress Notes (Signed)
Patient UD:Kelli [name], female DOB:November 11, 1971, 47 y.o. YOV:[id number]  Chief Complaint  Patient presents with  . Shoulder Pain    left    HPI  Kelli [name] is a 47 y.o. female who continues to have pain of the left shoulder.  The injection last time did not help that much.  She has been to PT and I have read their notes.  She has pain sleeping and with any overhead attempts with the left arm.  She has no numbness, no new trauma.   Body mass index is 44.11 kg/m.  The patient meets the AMA guidelines for Morbid (severe) obesity with a BMI > 40.0 and I have recommended weight loss.  ROS  Review of Systems  Constitutional: Positive for activity change.  Musculoskeletal: Positive for arthralgias.  Neurological: Positive for headaches.  All other systems reviewed and are negative.   All other systems reviewed and are negative.  The following is a summary of the past history medically, past history surgically, known current medicines, social history and family history.  This information is gathered electronically by the computer from prior information and documentation.  I review this each visit and have found including this information at this point in the chart is beneficial and informative.    Past Medical History:  Diagnosis Date  . Allergy   . Anemia   . Arthritis    knee  . Chronic headache   . Colon polyps 03/28/11  . Depression   . Diabetes mellitus   . Diverticulosis   . GAD (generalized anxiety disorder)   . Gallstones   . GERD (gastroesophageal reflux disease)   . Heart murmur   . Hyperlipidemia   . Migraines   . Obesity     Past Surgical History:  Procedure Laterality Date  . [place]  . CHOLECYSTECTOMY  1992  . COLONOSCOPY    . POLYPECTOMY    . RHINOPLASTY  1993  . TUBAL LIGATION Bilateral 1995    Family History  Problem Relation Age of Onset  . Diabetes Maternal Grandmother   . Colon polyps Mother   . Colon cancer Neg Hx    . Rectal cancer Neg Hx   . Stomach cancer Neg Hx     Social History Social History   Tobacco Use  . Smoking status: Current Every Day Smoker    Packs/day: 0.75    Years: 1.00    Pack years: 0.75    Last attempt to quit: 10/04/2015    Years since quitting: 2.8  . Smokeless tobacco: Never Used  Substance Use Topics  . Alcohol use: Yes    Comment: occasionally  . Drug use: No    Allergies  Allergen Reactions  . Aspirin     Vomiting and nausea  . Neosporin [Neomycin-Bacitracin Zn-Polymyx] Rash    "blisters"    Current Outpatient Medications  Medication Sig Dispense Refill  . ARIPiprazole (ABILIFY) 2 MG tablet Take 2 mg by mouth daily.    Marland Kitchen buPROPion (WELLBUTRIN XL) 300 MG 24 hr tablet Take 300 mg by mouth daily.      . cetirizine (ZYRTEC) 10 MG tablet Take 10 mg by mouth daily.    Marland Kitchen FARXIGA 5 MG TABS tablet Take 5 mg by mouth daily.    Marland Kitchen glimepiride (AMARYL) 2 MG tablet Take 2 mg by mouth daily.    Marland Kitchen HYDROcodone-acetaminophen (NORCO/VICODIN) 5-325 MG tablet One tablet every six hours for pain.  Limit 7 days. 28 tablet 0  .  Liraglutide (VICTOZA) 18 MG/3ML SOLN Inject 1.8 mg into the skin daily.     . Multiple Vitamin (MULTIVITAMIN WITH MINERALS) TABS tablet Take 1 tablet by mouth daily.    . naproxen (NAPROSYN) 500 MG tablet Take 1 tablet (500 mg total) by mouth 2 (two) times daily with a meal. 60 tablet 5  . nystatin-triamcinolone ointment (MYCOLOG) Apply 1 application topically 2 (two) times daily. 30 g 0  . OZEMPIC 1 MG/DOSE SOPN     . phenazopyridine (PYRIDIUM) 100 MG tablet Take 1 tablet (100 mg total) by mouth 3 (three) times daily as needed for pain. 15 tablet 0  . terconazole (TERAZOL 7) 0.4 % vaginal cream Place 1 applicator vaginally at bedtime. One applicator full QHS for seven days of therapy 45 g 0   Current Facility-Administered Medications  Medication Dose Route Frequency Provider Last Rate Last Dose  . 0.9 %  sodium chloride infusion  500 mL Intravenous  Once Milus Banister, MD         Physical Exam  Blood pressure 124/76, pulse 86, height 5' 4.5" (1.638 m), weight 261 lb (118.4 kg), last menstrual period 07/21/2018.  Constitutional: overall normal hygiene, normal nutrition, well developed, normal grooming, normal body habitus. Assistive device:none  Musculoskeletal: gait and station Limp none, muscle tone and strength are normal, no tremors or atrophy is present.  .  Neurological: coordination overall normal.  Deep tendon reflex/nerve stretch intact.  Sensation normal.  Cranial nerves II-XII intact.   Skin:   Normal overall no scars, lesions, ulcers or rashes. No psoriasis.  Psychiatric: Alert and oriented x 3.  Recent memory intact, remote memory unclear.  Normal mood and affect. Well groomed.  Good eye contact.  Cardiovascular: overall no swelling, no varicosities, no edema bilaterally, normal temperatures of the legs and arms, no clubbing, cyanosis and good capillary refill.  Lymphatic: palpation is normal.  Examination of left Upper Extremity is done.  Inspection:   Overall:  Elbow non-tender without crepitus or defects, forearm non-tender without crepitus or defects, wrist non-tender without crepitus or defects, hand non-tender.    Shoulder: with glenohumeral joint tenderness, without effusion.   Upper arm: without swelling and tenderness   Range of motion:   Overall:  Full range of motion of the elbow, full range of motion of wrist and full range of motion in fingers.   Shoulder:  left  110 degrees forward flexion; 100 degrees abduction; 30 degrees internal rotation, 30 degrees external rotation, 10 degrees extension, 40 degrees adduction.   Stability:   Overall:  Shoulder, elbow and wrist stable   Strength and Tone:   Overall full shoulder muscles strength, full upper arm strength and normal upper arm bulk and tone.  All other systems reviewed and are negative   The patient has been educated about the nature of the  problem(s) and counseled on treatment options.  The patient appeared to understand what I have discussed and is in agreement with it.  Encounter Diagnoses  Name Primary?  . Pain in joint of left shoulder   . Body mass index 40.0-44.9, adult ([place]) Yes  . Morbid obesity ([place])   . Cigarette nicotine dependence without complication     PLAN Call if any problems.  Precautions discussed.  Continue current medications.   Return to clinic try to get MRI of the right shoulder   I have reviewed the [place] web site prior to prescribing narcotic medicine for this patient.  Electronically Signed Sanjuana Kava, MD 9/10/20198:23 AM

## 2018-08-20 NOTE — Patient Instructions (Signed)
Steps to Quit Smoking Smoking tobacco can be bad for your health. It can also affect almost every organ in your body. Smoking puts you and people around you at risk for many serious long-lasting (chronic) diseases. Quitting smoking is hard, but it is one of the best things that you can do for your health. It is never too late to quit. What are the benefits of quitting smoking? When you quit smoking, you lower your risk for getting serious diseases and conditions. They can include:  Lung cancer or lung disease.  Heart disease.  Stroke.  Heart attack.  Not being able to have children (infertility).  Weak bones (osteoporosis) and broken bones (fractures).  If you have coughing, wheezing, and shortness of breath, those symptoms may get better when you quit. You may also get sick less often. If you are pregnant, quitting smoking can help to lower your chances of having a baby of low birth weight. What can I do to help me quit smoking? Talk with your doctor about what can help you quit smoking. Some things you can do (strategies) include:  Quitting smoking totally, instead of slowly cutting back how much you smoke over a period of time.  Going to in-person counseling. You are more likely to quit if you go to many counseling sessions.  Using resources and support systems, such as: ? Online chats with a counselor. ? Phone quitlines. ? Printed self-help materials. ? Support groups or group counseling. ? Text messaging programs. ? Mobile phone apps or applications.  Taking medicines. Some of these medicines may have nicotine in them. If you are pregnant or breastfeeding, do not take any medicines to quit smoking unless your doctor says it is okay. Talk with your doctor about counseling or other things that can help you.  Talk with your doctor about using more than one strategy at the same time, such as taking medicines while you are also going to in-person counseling. This can help make  quitting easier. What things can I do to make it easier to quit? Quitting smoking might feel very hard at first, but there is a lot that you can do to make it easier. Take these steps:  Talk to your family and friends. Ask them to support and encourage you.  Call phone quitlines, reach out to support groups, or work with a counselor.  Ask people who smoke to not smoke around you.  Avoid places that make you want (trigger) to smoke, such as: ? Bars. ? Parties. ? Smoke-break areas at work.  Spend time with people who do not smoke.  Lower the stress in your life. Stress can make you want to smoke. Try these things to help your stress: ? Getting regular exercise. ? Deep-breathing exercises. ? Yoga. ? Meditating. ? Doing a body scan. To do this, close your eyes, focus on one area of your body at a time from head to toe, and notice which parts of your body are tense. Try to relax the muscles in those areas.  Download or buy apps on your mobile phone or tablet that can help you stick to your quit plan. There are many free apps, such as QuitGuide from the CDC (Centers for Disease Control and Prevention). You can find more support from smokefree.gov and other websites.  This information is not intended to replace advice given to you by your health care provider. Make sure you discuss any questions you have with your health care provider. Document Released: 09/23/2009 Document   Revised: 07/25/2016 Document Reviewed: 04/13/2015 Elsevier Interactive Patient Education  2018 [employer] Inc.  

## 2018-08-30 ENCOUNTER — Telehealth: Payer: Self-pay | Admitting: Orthopaedic Surgery

## 2018-08-30 NOTE — Telephone Encounter (Signed)
Hydrocodone-Acetaminophen  5/325 mg  Qty  28 Tablets  PATIENT USES [facility] IN [place]

## 2018-09-02 MED ORDER — HYDROCODONE-ACETAMINOPHEN 5-325 MG PO TABS: ORAL_TABLET | ORAL | 0 refills | Status: DC

## 2018-09-03 ENCOUNTER — Ambulatory Visit: Payer: PRIVATE HEALTH INSURANCE | Admitting: Orthopaedic Surgery

## 2018-09-03 ENCOUNTER — Encounter: Payer: Self-pay | Admitting: Orthopaedic Surgery

## 2018-09-03 VITALS — BP 120/72 | HR 81 | Ht 64.5 in | Wt 261.0 lb

## 2018-09-03 DIAGNOSIS — F17210 Nicotine dependence, cigarettes, uncomplicated: Secondary | ICD-10-CM

## 2018-09-03 DIAGNOSIS — M25512 Pain in left shoulder: Secondary | ICD-10-CM | POA: Diagnosis not present

## 2018-09-03 DIAGNOSIS — Z6841 Body Mass Index (BMI) 40.0 and over, adult: Secondary | ICD-10-CM

## 2018-09-03 NOTE — Patient Instructions (Signed)
Steps to Quit Smoking Smoking tobacco can be bad for your health. It can also affect almost every organ in your body. Smoking puts you and people around you at risk for many serious long-lasting (chronic) diseases. Quitting smoking is hard, but it is one of the best things that you can do for your health. It is never too late to quit. What are the benefits of quitting smoking? When you quit smoking, you lower your risk for getting serious diseases and conditions. They can include:  Lung cancer or lung disease.  Heart disease.  Stroke.  Heart attack.  Not being able to have children (infertility).  Weak bones (osteoporosis) and broken bones (fractures).  If you have coughing, wheezing, and shortness of breath, those symptoms may get better when you quit. You may also get sick less often. If you are pregnant, quitting smoking can help to lower your chances of having a baby of low birth weight. What can I do to help me quit smoking? Talk with your doctor about what can help you quit smoking. Some things you can do (strategies) include:  Quitting smoking totally, instead of slowly cutting back how much you smoke over a period of time.  Going to in-person counseling. You are more likely to quit if you go to many counseling sessions.  Using resources and support systems, such as: ? Online chats with a counselor. ? Phone quitlines. ? Printed self-help materials. ? Support groups or group counseling. ? Text messaging programs. ? Mobile phone apps or applications.  Taking medicines. Some of these medicines may have nicotine in them. If you are pregnant or breastfeeding, do not take any medicines to quit smoking unless your doctor says it is okay. Talk with your doctor about counseling or other things that can help you.  Talk with your doctor about using more than one strategy at the same time, such as taking medicines while you are also going to in-person counseling. This can help make  quitting easier. What things can I do to make it easier to quit? Quitting smoking might feel very hard at first, but there is a lot that you can do to make it easier. Take these steps:  Talk to your family and friends. Ask them to support and encourage you.  Call phone quitlines, reach out to support groups, or work with a counselor.  Ask people who smoke to not smoke around you.  Avoid places that make you want (trigger) to smoke, such as: ? Bars. ? Parties. ? Smoke-break areas at work.  Spend time with people who do not smoke.  Lower the stress in your life. Stress can make you want to smoke. Try these things to help your stress: ? Getting regular exercise. ? Deep-breathing exercises. ? Yoga. ? Meditating. ? Doing a body scan. To do this, close your eyes, focus on one area of your body at a time from head to toe, and notice which parts of your body are tense. Try to relax the muscles in those areas.  Download or buy apps on your mobile phone or tablet that can help you stick to your quit plan. There are many free apps, such as QuitGuide from the CDC (Centers for Disease Control and Prevention). You can find more support from smokefree.gov and other websites.  This information is not intended to replace advice given to you by your health care provider. Make sure you discuss any questions you have with your health care provider. Document Released: 09/23/2009 Document   Revised: 07/25/2016 Document Reviewed: 04/13/2015 Elsevier Interactive Patient Education  2018 [employer] Inc.  

## 2018-09-03 NOTE — Progress Notes (Signed)
[name], female DOB:[birth year], 47 y.o. [id number]  Chief Complaint  Patient presents with  . Shoulder Pain    left  . Results    review MRI    HPI  Kelli [name] is a 47 y.o. female who has left shoulder pain that is not improving.  She had a MRI at [facility].  It showed: Tendinosis and fraying of the rotator cuff without focal tear as well as tears of the anterior and inferior labra with para labral cyst formation anterior inferiorly and inferiorly measuring up to 1.1 cm.  There is degenerative change of the posterior labrum with type I SLAP.  I have informed her of the findings.  I will have Dr. Aline Brochure see her as to whether surgery might help or PT.  She is agreeable to this.   Body mass index is 44.11 kg/m.  ROS  Review of Systems  Constitutional: Positive for activity change.  Musculoskeletal: Positive for arthralgias.  Neurological: Positive for headaches.  All other systems reviewed and are negative.  I have told her to cut back on her smoking.  All other systems reviewed and are negative.  The following is a summary of the past history medically, past history surgically, known current medicines, social history and family history.  This information is gathered electronically by the computer from prior information and documentation.  I review this each visit and have found including this information at this point in the chart is beneficial and informative.    Past Medical History:  Diagnosis Date  . Allergy   . Anemia   . Arthritis    knee  . Chronic headache   . Colon polyps 03/28/11  . Depression   . Diabetes mellitus   . Diverticulosis   . GAD (generalized anxiety disorder)   . Gallstones   . GERD (gastroesophageal reflux disease)   . Heart murmur   . Hyperlipidemia   . Migraines   . Obesity     Past Surgical History:  Procedure Laterality Date  . [place]  . CHOLECYSTECTOMY  1992  . COLONOSCOPY     . POLYPECTOMY    . RHINOPLASTY  1993  . TUBAL LIGATION Bilateral 1995    Family History  Problem Relation Age of Onset  . Diabetes Maternal Grandmother   . Colon polyps Mother   . Colon cancer Neg Hx   . Rectal cancer Neg Hx   . Stomach cancer Neg Hx     Social History Social History   Tobacco Use  . Smoking status: Current Every Day Smoker    Packs/day: 0.75    Years: 1.00    Pack years: 0.75    Last attempt to quit: 10/04/2015    Years since quitting: 2.9  . Smokeless tobacco: Never Used  Substance Use Topics  . Alcohol use: Yes    Comment: occasionally  . Drug use: No    Allergies  Allergen Reactions  . Aspirin     Vomiting and nausea  . Neosporin [Neomycin-Bacitracin Zn-Polymyx] Rash    "blisters"    Current Outpatient Medications  Medication Sig Dispense Refill  . ARIPiprazole (ABILIFY) 2 MG tablet Take 2 mg by mouth daily.    Marland Kitchen buPROPion (WELLBUTRIN XL) 300 MG 24 hr tablet Take 300 mg by mouth daily.      . cetirizine (ZYRTEC) 10 MG tablet Take 10 mg by mouth daily.    Marland Kitchen FARXIGA 5 MG TABS tablet Take 5 mg by mouth  daily.    . glimepiride (AMARYL) 2 MG tablet Take 2 mg by mouth daily.    Marland Kitchen HYDROcodone-acetaminophen (NORCO/VICODIN) 5-325 MG tablet One tablet every six hours for pain.  Limit 7 days. 28 tablet 0  . JANUVIA 100 MG tablet Take 100 mg by mouth daily.  5  . Liraglutide (VICTOZA) 18 MG/3ML SOLN Inject 1.8 mg into the skin daily.     . Multiple Vitamin (MULTIVITAMIN WITH MINERALS) TABS tablet Take 1 tablet by mouth daily.    . naproxen (NAPROSYN) 500 MG tablet Take 1 tablet (500 mg total) by mouth 2 (two) times daily with a meal. 60 tablet 5  . nystatin-triamcinolone ointment (MYCOLOG) Apply 1 application topically 2 (two) times daily. 30 g 0  . OZEMPIC 1 MG/DOSE SOPN      No current facility-administered medications for this visit.      Physical Exam  Blood pressure 120/72, pulse 81, height 5' 4.5" (1.638 m), weight 261 lb (118.4  kg).  Constitutional: overall normal hygiene, normal nutrition, well developed, normal grooming, normal body habitus. Assistive device:none  Musculoskeletal: gait and station Limp none, muscle tone and strength are normal, no tremors or atrophy is present.  .  Neurological: coordination overall normal.  Deep tendon reflex/nerve stretch intact.  Sensation normal.  Cranial nerves II-XII intact.   Skin:   Normal overall no scars, lesions, ulcers or rashes. No psoriasis.  Psychiatric: Alert and oriented x 3.  Recent memory intact, remote memory unclear.  Normal mood and affect. Well groomed.  Good eye contact.  Cardiovascular: overall no swelling, no varicosities, no edema bilaterally, normal temperatures of the legs and arms, no clubbing, cyanosis and good capillary refill.  Lymphatic: palpation is normal.  She has good ROM of the left shoulder but painful in the extremes, more overhead motion is painful.  NV intact.  All other systems reviewed and are negative   The patient has been educated about the nature of the problem(s) and counseled on treatment options.  The patient appeared to understand what I have discussed and is in agreement with it.  Encounter Diagnoses  Name Primary?  . Pain in joint of left shoulder Yes  . Cigarette nicotine dependence without complication   . Body mass index 40.0-44.9, adult ([place])   . Morbid obesity ([place])     PLAN Call if any problems.  Precautions discussed.  Continue current medications.   Return to clinic to see Dr. Aline Brochure   Electronically Signed Sanjuana Kava, MD 9/24/20198:27 AM

## 2018-09-09 ENCOUNTER — Other Ambulatory Visit: Payer: Self-pay

## 2018-09-09 NOTE — Telephone Encounter (Signed)
Return to sender address unkown

## 2018-09-10 MED ORDER — HYDROCODONE-ACETAMINOPHEN 5-325 MG PO TABS: ORAL_TABLET | ORAL | 0 refills | Status: DC

## 2018-09-17 ENCOUNTER — Other Ambulatory Visit: Payer: Self-pay

## 2018-09-17 MED ORDER — HYDROCODONE-ACETAMINOPHEN 5-325 MG PO TABS: ORAL_TABLET | ORAL | 0 refills | Status: DC

## 2018-09-18 ENCOUNTER — Ambulatory Visit: Payer: PRIVATE HEALTH INSURANCE | Admitting: Orthopedic Surgery

## 2018-09-24 ENCOUNTER — Other Ambulatory Visit: Payer: Self-pay

## 2018-09-25 MED ORDER — HYDROCODONE-ACETAMINOPHEN 5-325 MG PO TABS: ORAL_TABLET | ORAL | 0 refills | Status: DC

## 2018-10-01 ENCOUNTER — Telehealth: Payer: Self-pay | Admitting: Orthopaedic Surgery

## 2018-10-01 NOTE — Telephone Encounter (Signed)
Kelli [name] called after hours and left a voicemail stating she wanted to cancel the consult appointment with Dr. Aline Brochure for Wednesday.  She said her ROM was much better and she was taking OTC pain meds for pain management.  Appointment was canceled.

## 2018-10-02 ENCOUNTER — Ambulatory Visit: Payer: PRIVATE HEALTH INSURANCE | Admitting: Orthopedic Surgery

## 2018-11-06 ENCOUNTER — Encounter: Payer: Self-pay | Admitting: "Endocrinology

## 2019-01-15 ENCOUNTER — Ambulatory Visit: Payer: PRIVATE HEALTH INSURANCE | Admitting: "Endocrinology

## 2019-02-17 ENCOUNTER — Emergency Department (HOSPITAL_COMMUNITY): Payer: PRIVATE HEALTH INSURANCE

## 2019-02-17 ENCOUNTER — Other Ambulatory Visit: Payer: Self-pay

## 2019-02-17 ENCOUNTER — Encounter (HOSPITAL_COMMUNITY): Payer: Self-pay | Admitting: Emergency Medicine

## 2019-02-17 ENCOUNTER — Emergency Department (HOSPITAL_COMMUNITY)
Admission: EM | Admit: 2019-02-17 | Discharge: 2019-02-17 | Disposition: A | Discharge status: Home / Self Care | Payer: PRIVATE HEALTH INSURANCE | Attending: Emergency Medicine | Admitting: Emergency Medicine

## 2019-02-17 DIAGNOSIS — Z79899 Other long term (current) drug therapy: Secondary | ICD-10-CM | POA: Diagnosis not present

## 2019-02-17 DIAGNOSIS — N3000 Acute cystitis without hematuria: Secondary | ICD-10-CM | POA: Insufficient documentation

## 2019-02-17 DIAGNOSIS — E119 Type 2 diabetes mellitus without complications: Secondary | ICD-10-CM | POA: Insufficient documentation

## 2019-02-17 DIAGNOSIS — F17210 Nicotine dependence, cigarettes, uncomplicated: Secondary | ICD-10-CM | POA: Diagnosis not present

## 2019-02-17 DIAGNOSIS — I10 Essential (primary) hypertension: Secondary | ICD-10-CM | POA: Insufficient documentation

## 2019-02-17 DIAGNOSIS — J111 Influenza due to unidentified influenza virus with other respiratory manifestations: Secondary | ICD-10-CM | POA: Insufficient documentation

## 2019-02-17 DIAGNOSIS — R05 Cough: Secondary | ICD-10-CM | POA: Diagnosis present

## 2019-02-17 DIAGNOSIS — R69 Illness, unspecified: Secondary | ICD-10-CM

## 2019-02-17 LAB — CBC WITH DIFFERENTIAL/PLATELET
Abs Immature Granulocytes: 0.05 10*3/uL (ref 0.00–0.07)
BASOS PCT: 1 %
Basophils Absolute: 0.1 10*3/uL (ref 0.0–0.1)
Eosinophils Absolute: 0.4 10*3/uL (ref 0.0–0.5)
Eosinophils Relative: 4 %
HCT: 41.6 % (ref 36.0–46.0)
Hemoglobin: 14.1 g/dL (ref 12.0–15.0)
Immature Granulocytes: 1 %
Lymphocytes Relative: 38 %
Lymphs Abs: 3.8 10*3/uL (ref 0.7–4.0)
MCH: 33 pg (ref 26.0–34.0)
MCHC: 33.9 g/dL (ref 30.0–36.0)
MCV: 97.4 fL (ref 80.0–100.0)
MONOS PCT: 6 %
Monocytes Absolute: 0.6 10*3/uL (ref 0.1–1.0)
Neutro Abs: 5.2 10*3/uL (ref 1.7–7.7)
Neutrophils Relative %: 50 %
PLATELETS: 178 10*3/uL (ref 150–400)
RBC: 4.27 MIL/uL (ref 3.87–5.11)
RDW: 12.6 % (ref 11.5–15.5)
WBC: 10.1 10*3/uL (ref 4.0–10.5)
nRBC: 0 % (ref 0.0–0.2)

## 2019-02-17 LAB — INFLUENZA PANEL BY PCR (TYPE A & B)
Influenza A By PCR: NEGATIVE
Influenza B By PCR: NEGATIVE

## 2019-02-17 LAB — COMPREHENSIVE METABOLIC PANEL
ALT: 14 U/L (ref 0–44)
AST: 17 U/L (ref 15–41)
Albumin: 3.6 g/dL (ref 3.5–5.0)
Alkaline Phosphatase: 87 U/L (ref 38–126)
Anion gap: 12 (ref 5–15)
BUN: 12 mg/dL (ref 6–20)
CO2: 21 mmol/L — ABNORMAL LOW (ref 22–32)
CREATININE: 0.51 mg/dL (ref 0.44–1.00)
Calcium: 9 mg/dL (ref 8.9–10.3)
Chloride: 100 mmol/L (ref 98–111)
GFR calc Af Amer: >60 mL/min (ref >60)
GFR calc non Af Amer: >60 mL/min (ref >60)
Glucose, Bld: 223 mg/dL — ABNORMAL HIGH (ref 70–99)
Potassium: 4.1 mmol/L (ref 3.5–5.1)
Sodium: 133 mmol/L — ABNORMAL LOW (ref 135–145)
Total Bilirubin: 1 mg/dL (ref 0.3–1.2)
Total Protein: 6.4 g/dL — ABNORMAL LOW (ref 6.5–8.1)

## 2019-02-17 LAB — URINALYSIS, ROUTINE W REFLEX MICROSCOPIC
Bilirubin Urine: NEGATIVE
Hgb urine dipstick: NEGATIVE
Ketones, ur: NEGATIVE mg/dL
NITRITE: POSITIVE — AB
PROTEIN: NEGATIVE mg/dL
Specific Gravity, Urine: 1.015 (ref 1.005–1.030)
pH: 5 (ref 5.0–8.0)

## 2019-02-17 MED ORDER — IBUPROFEN 800 MG PO TABS
800.0000 mg | ORAL_TABLET | Freq: Once | ORAL | Status: AC
  Administered 2019-02-17: 800 mg via ORAL
  Filled 2019-02-17: qty 1

## 2019-02-17 MED ORDER — SODIUM CHLORIDE 0.9 % IV SOLN
1.0000 g | Freq: Once | INTRAVENOUS | Status: AC
  Administered 2019-02-17: 1 g via INTRAVENOUS
  Filled 2019-02-17: qty 10

## 2019-02-17 MED ORDER — CEPHALEXIN 500 MG PO CAPS: 500.0000 mg | ORAL_CAPSULE | Freq: Four times a day (QID) | ORAL | 0 refills | Status: DC

## 2019-02-17 MED ORDER — ONDANSETRON HCL 4 MG/2ML IJ SOLN
4.0000 mg | Freq: Once | INTRAMUSCULAR | Status: AC
  Administered 2019-02-17: 4 mg via INTRAVENOUS
  Filled 2019-02-17: qty 2

## 2019-02-17 MED ORDER — SODIUM CHLORIDE 0.9 % IV BOLUS (SEPSIS)
1000.0000 mL | Freq: Once | INTRAVENOUS | Status: AC
  Administered 2019-02-17: 1000 mL via INTRAVENOUS

## 2019-02-17 NOTE — Discharge Instructions (Addendum)
Follow-up with your doctor later this week for recheck.  Drink plenty of fluids.  Tylenol or Motrin for fevers and aches

## 2019-02-17 NOTE — ED Provider Notes (Signed)
[facility] EMERGENCY DEPARTMENT Provider Note   CSN: [id number] Arrival date & time: 02/17/19  1150    History   Chief Complaint Chief Complaint  Patient presents with  . Influenza    HPI Kelli [name] is a 48 y.o. female.     Patient complains of cough sneezing and dysuria.  Many family members have had the cough  The history is provided by the patient.  Influenza  Presenting symptoms: cough   Presenting symptoms: no diarrhea, no fatigue and no headaches   Severity:  Moderate Onset quality:  Sudden Progression:  Unchanged Chronicity:  New Worsened by:  Nothing Ineffective treatments:  None tried Associated symptoms: chills   Associated symptoms: no congestion   Risk factors: diabetes     Past Medical History:  Diagnosis Date  . Allergy   . Anemia   . Arthritis    knee  . Chronic headache   . Colon polyps 03/28/11  . Depression   . Diabetes mellitus   . Diverticulosis   . GAD (generalized anxiety disorder)   . Gallstones   . GERD (gastroesophageal reflux disease)   . Heart murmur   . Hyperlipidemia   . Migraines   . Obesity     Patient Active Problem List   Diagnosis Date Noted  . Diabetes mellitus 02/25/2011  . Hypertension 02/25/2011  . Hyperlipidemia 02/25/2011  . Depression 02/25/2011  . Generalized anxiety disorder 02/25/2011  . CONSTIPATION 02/22/2011    Past Surgical History:  Procedure Laterality Date  . [place]  . CHOLECYSTECTOMY  1992  . COLONOSCOPY    . POLYPECTOMY    . RHINOPLASTY  1993  . TUBAL LIGATION Bilateral 1995     OB History    Gravida  3   Para  2   Term      Preterm      AB      Living  2     SAB      TAB      Ectopic      Multiple      Live Births               Home Medications    Prior to Admission medications   Medication Sig Start Date End Date Taking? Authorizing Provider  bismuth subsalicylate (PEPTO BISMOL) 262 MG/15ML suspension Take 30 mLs by mouth  every 6 (six) hours as needed for indigestion or diarrhea or loose stools.   Yes [provider]  buPROPion (WELLBUTRIN XL) 300 MG 24 hr tablet Take 300 mg by mouth daily.     Yes [provider]  cetirizine (ZYRTEC) 10 MG tablet Take 10 mg by mouth daily.   Yes [provider]  escitalopram (LEXAPRO) 10 MG tablet Take 1 tablet by mouth daily. 12/20/18  Yes [provider]  JANUVIA 100 MG tablet Take 100 mg by mouth daily. 08/20/18  Yes [provider]  Multiple Vitamin (MULTIVITAMIN WITH MINERALS) TABS tablet Take 1 tablet by mouth daily.   Yes [provider]  OZEMPIC 1 MG/DOSE SOPN Inject 1 mg into the skin every Saturday.  01/17/18  Yes [provider]  cephALEXin (KEFLEX) 500 MG capsule Take 1 capsule (500 mg total) by mouth 4 (four) times daily. 02/17/19   Milton Ferguson, MD    Family History Family History  Problem Relation Age of Onset  . Diabetes Maternal Grandmother   . Colon polyps Mother   . Colon cancer Neg Hx   .  Rectal cancer Neg Hx   . Stomach cancer Neg Hx     Social History Social History   Tobacco Use  . Smoking status: Current Every Day Smoker    Packs/day: 0.75    Years: 1.00    Pack years: 0.75    Last attempt to quit: 10/04/2015    Years since quitting: 3.3  . Smokeless tobacco: Never Used  Substance Use Topics  . Alcohol use: Yes    Comment: occasionally  . Drug use: No     Allergies   Aspirin and Neosporin [neomycin-bacitracin zn-polymyx]   Review of Systems Review of Systems  Constitutional: Positive for chills. Negative for appetite change and fatigue.  HENT: Negative for congestion, ear discharge and sinus pressure.   Eyes: Negative for discharge.  Respiratory: Positive for cough.   Cardiovascular: Negative for chest pain.  Gastrointestinal: Negative for abdominal pain and diarrhea.  Genitourinary: Positive for dysuria. Negative for frequency and hematuria.  Musculoskeletal:  Negative for back pain.  Skin: Negative for rash.  Neurological: Negative for seizures and headaches.  Psychiatric/Behavioral: Negative for hallucinations.     Physical Exam Updated Vital Signs BP 118/74   Pulse 77   Temp 97.7 F (36.5 C) (Oral)   Resp 19   Ht 5\' 4"  (1.626 m)   Wt 113.4 kg   SpO2 98%   BMI 42.91 kg/m   Physical Exam Vitals signs and nursing note reviewed.  Constitutional:      Appearance: She is well-developed.  HENT:     Head: Normocephalic.     Nose: Nose normal.  Eyes:     General: No scleral icterus.    Conjunctiva/sclera: Conjunctivae normal.  Neck:     Musculoskeletal: Neck supple.     Thyroid: No thyromegaly.  Cardiovascular:     Rate and Rhythm: Normal rate and regular rhythm.     Heart sounds: No murmur. No friction rub. No gallop.   Pulmonary:     Breath sounds: No stridor. No wheezing or rales.  Chest:     Chest wall: No tenderness.  Abdominal:     General: There is no distension.     Tenderness: There is no abdominal tenderness. There is no rebound.  Musculoskeletal: Normal range of motion.  Lymphadenopathy:     Cervical: No cervical adenopathy.  Skin:    Findings: No erythema or rash.  Neurological:     Mental Status: She is oriented to person, place, and time.     Motor: No abnormal muscle tone.     Coordination: Coordination normal.  Psychiatric:        Behavior: Behavior normal.      ED Treatments / Results  Labs (all labs ordered are listed, but only abnormal results are displayed) Labs Reviewed  URINALYSIS, ROUTINE W REFLEX MICROSCOPIC - Abnormal; Notable for the following components:      Result Value   APPearance HAZY (*)    Glucose, UA >=500 (*)    Nitrite POSITIVE (*)    Leukocytes,Ua SMALL (*)    Bacteria, UA MANY (*)    All other components within normal limits  COMPREHENSIVE METABOLIC PANEL - Abnormal; Notable for the following components:   Sodium 133 (*)    CO2 21 (*)    Glucose, Bld 223 (*)    Total  Protein 6.4 (*)    All other components within normal limits  URINE CULTURE  CBC WITH DIFFERENTIAL/PLATELET  INFLUENZA PANEL BY PCR (TYPE A & B)  EKG None  Radiology Dg Chest 2 View  Result Date: 02/17/2019 CLINICAL DATA:  Cough with nausea EXAM: CHEST - 2 VIEW COMPARISON:  January 14, 2019 FINDINGS: There is no evident edema or consolidation. Heart size and pulmonary vascularity are normal. No adenopathy. There is degenerative change in the thoracic spine. IMPRESSION: No edema or consolidation. Electronically Signed   By: Lowella Grip III M.D.   On: 02/17/2019 13:54    Procedures Procedures (including critical care time)  Medications Ordered in ED Medications  cefTRIAXone (ROCEPHIN) 1 g in sodium chloride 0.9 % 100 mL IVPB (1 g Intravenous New Bag/Given 02/17/19 1733)  sodium chloride 0.9 % bolus 1,000 mL (0 mLs Intravenous Stopped 02/17/19 1552)  ondansetron (ZOFRAN) injection 4 mg (4 mg Intravenous Given 02/17/19 1420)  ibuprofen (ADVIL,MOTRIN) tablet 800 mg (800 mg Oral Given 02/17/19 1737)     Initial Impression / Assessment and Plan / ED Course  I have reviewed the triage vital signs and the nursing notes.  Pertinent labs & imaging results that were available during my care of the patient were reviewed by me and considered in my medical decision making (see chart for details).        Labs show patient with urinary tract infection.  She will be placed on Keflex.  Also symptoms suggestive of viral syndrome.  She will take Tylenol or Motrin and drink plenty of fluids and follow-up with PCP  Final Clinical Impressions(s) / ED Diagnoses   Final diagnoses:  Influenza-like illness  Acute cystitis without hematuria    ED Discharge Orders         Ordered    cephALEXin (KEFLEX) 500 MG capsule  4 times daily     02/17/19 1801           Milton Ferguson, MD 02/17/19 1807

## 2019-02-17 NOTE — ED Triage Notes (Signed)
Cough, sneezing, nausea.  Has been around several family members with the flu.  Also reports urinary frequency , has hx of uti's

## 2019-02-20 LAB — URINE CULTURE

## 2019-02-21 ENCOUNTER — Telehealth: Payer: Self-pay | Admitting: Emergency Medicine

## 2019-02-21 NOTE — Telephone Encounter (Signed)
Post ED Visit - Positive Culture Follow-up  Culture report reviewed by antimicrobial stewardship pharmacist: [place] Team []  Elenor Quinones, Pharm.D. []  Heide Guile, Pharm.D., BCPS AQ-ID []  Parks Neptune, Pharm.D., BCPS []  Alycia Rossetti, Pharm.D., BCPS []  [place], Pharm.D., BCPS, AAHIVP []  Legrand Como, Pharm.D., BCPS, AAHIVP []  Salome Arnt, PharmD, BCPS []  Johnnette Gourd, PharmD, BCPS [x]  Hughes Better, PharmD, BCPS []  Leeroy Cha, PharmD []  Laqueta Linden, PharmD, BCPS []  Albertina Parr, PharmD  [place] Team []  Leodis Sias, PharmD []  Lindell Spar, PharmD []  Royetta Asal, PharmD []  Graylin Shiver, Rph []  Rema Fendt) Glennon Mac, PharmD []  Arlyn Dunning, PharmD []  Netta Cedars, PharmD []  Dia Sitter, PharmD []  Leone Haven, PharmD []  Gretta Arab, PharmD []  Theodis Shove, PharmD []  Peggyann Juba, PharmD []  Reuel Boom, PharmD   Positive urine culture Treated with Cephalexin, organism sensitive to the same and no further patient follow-up is required at this time.  [place] Jethro Radke 02/21/2019, 11:36 AM

## 2019-09-29 ENCOUNTER — Other Ambulatory Visit: Payer: Self-pay

## 2019-09-29 ENCOUNTER — Emergency Department (HOSPITAL_COMMUNITY)
Admission: EM | Admit: 2019-09-29 | Discharge: 2019-09-29 | Disposition: A | Discharge status: Home / Self Care | Payer: PRIVATE HEALTH INSURANCE | Attending: Emergency Medicine | Admitting: Emergency Medicine

## 2019-09-29 ENCOUNTER — Encounter (HOSPITAL_COMMUNITY): Payer: Self-pay | Admitting: Emergency Medicine

## 2019-09-29 DIAGNOSIS — E1165 Type 2 diabetes mellitus with hyperglycemia: Secondary | ICD-10-CM | POA: Insufficient documentation

## 2019-09-29 DIAGNOSIS — Z79899 Other long term (current) drug therapy: Secondary | ICD-10-CM | POA: Insufficient documentation

## 2019-09-29 DIAGNOSIS — L292 Pruritus vulvae: Secondary | ICD-10-CM | POA: Diagnosis present

## 2019-09-29 DIAGNOSIS — N760 Acute vaginitis: Secondary | ICD-10-CM | POA: Insufficient documentation

## 2019-09-29 DIAGNOSIS — R7309 Other abnormal glucose: Secondary | ICD-10-CM

## 2019-09-29 DIAGNOSIS — I10 Essential (primary) hypertension: Secondary | ICD-10-CM | POA: Insufficient documentation

## 2019-09-29 DIAGNOSIS — B9689 Other specified bacterial agents as the cause of diseases classified elsewhere: Secondary | ICD-10-CM

## 2019-09-29 DIAGNOSIS — N3001 Acute cystitis with hematuria: Secondary | ICD-10-CM

## 2019-09-29 DIAGNOSIS — F17210 Nicotine dependence, cigarettes, uncomplicated: Secondary | ICD-10-CM | POA: Diagnosis not present

## 2019-09-29 LAB — BASIC METABOLIC PANEL
Anion gap: 10 (ref 5–15)
BUN: 9 mg/dL (ref 6–20)
CO2: 27 mmol/L (ref 22–32)
Calcium: 8.9 mg/dL (ref 8.9–10.3)
Chloride: 96 mmol/L — ABNORMAL LOW (ref 98–111)
Creatinine, Ser: 0.48 mg/dL (ref 0.44–1.00)
GFR calc Af Amer: >60 mL/min (ref >60)
GFR calc non Af Amer: >60 mL/min (ref >60)
Glucose, Bld: 332 mg/dL — ABNORMAL HIGH (ref 70–99)
Potassium: 3.9 mmol/L (ref 3.5–5.1)
Sodium: 133 mmol/L — ABNORMAL LOW (ref 135–145)

## 2019-09-29 LAB — WET PREP, GENITAL
Sperm: NONE SEEN
Trich, Wet Prep: NONE SEEN
Yeast Wet Prep HPF POC: NONE SEEN

## 2019-09-29 LAB — PREGNANCY, URINE: Preg Test, Ur: NEGATIVE

## 2019-09-29 LAB — URINALYSIS, ROUTINE W REFLEX MICROSCOPIC
Bilirubin Urine: NEGATIVE
Glucose, UA: >=500 mg/dL — AB
Ketones, ur: 5 mg/dL — AB
Nitrite: NEGATIVE
Protein, ur: NEGATIVE mg/dL
Specific Gravity, Urine: 1.027 (ref 1.005–1.030)
pH: 6 (ref 5.0–8.0)

## 2019-09-29 LAB — CBG MONITORING, ED: Glucose-Capillary: 391 mg/dL — ABNORMAL HIGH (ref 70–99)

## 2019-09-29 MED ORDER — IBUPROFEN 800 MG PO TABS
800.0000 mg | ORAL_TABLET | Freq: Once | ORAL | Status: AC
  Administered 2019-09-29: 800 mg via ORAL
  Filled 2019-09-29: qty 1

## 2019-09-29 MED ORDER — ONDANSETRON 4 MG PO TBDP
4.0000 mg | ORAL_TABLET | Freq: Once | ORAL | Status: AC
  Administered 2019-09-29: 11:00:00 4 mg via ORAL
  Filled 2019-09-29: qty 1

## 2019-09-29 MED ORDER — METRONIDAZOLE 500 MG PO TABS: 500.0000 mg | ORAL_TABLET | Freq: Two times a day (BID) | ORAL | 0 refills | Status: DC

## 2019-09-29 MED ORDER — CEPHALEXIN 500 MG PO CAPS: 500.0000 mg | ORAL_CAPSULE | Freq: Three times a day (TID) | ORAL | 0 refills | Status: AC

## 2019-09-29 NOTE — ED Provider Notes (Signed)
[facility] EMERGENCY DEPARTMENT Provider Note   CSN: [id number] Arrival date & time: 09/29/19  1006     History   Chief Complaint Chief Complaint  Patient presents with  . Vaginal Itching    HPI Kelli [name] is a 48 y.o. female with a past medical history significant for migraines, diabetes mellitus, anxiety, HTN, and hyperlipidemia who presents to the ED due to an acute onset of severe vaginal itching x 3 days. Patient notes she had intercourse with her husband on Friday and used lubrication and sex toys and then started to feel vaginal irritation on Saturday. She took over the counter yeast medication on Sunday with no relief. Vaginal irritation is associated with severe throbbing pain, swelling, and increased white vaginal discharge. Patient has a history of chronic UTIs and was recently treated for a UTI back in the end of September with Nitrofurantion. Patient finished all abx and symptoms resolved. Patient denies back pain, fever, chills, urinary symptoms, vaginal bleeding, abdominal pain, chest pain, and shortness of breath.   Past Medical History:  Diagnosis Date  . Allergy   . Anemia   . Arthritis    knee  . Chronic headache   . Colon polyps 03/28/11  . Depression   . Diabetes mellitus   . Diverticulosis   . GAD (generalized anxiety disorder)   . Gallstones   . GERD (gastroesophageal reflux disease)   . Heart murmur   . Hyperlipidemia   . Migraines   . Obesity     Patient Active Problem List   Diagnosis Date Noted  . Diabetes mellitus 02/25/2011  . Hypertension 02/25/2011  . Hyperlipidemia 02/25/2011  . Depression 02/25/2011  . Generalized anxiety disorder 02/25/2011  . CONSTIPATION 02/22/2011    Past Surgical History:  Procedure Laterality Date  . Kappa  . CHOLECYSTECTOMY  1992  . COLONOSCOPY    . POLYPECTOMY    . RHINOPLASTY  1993  . TUBAL LIGATION Bilateral 1995     OB History    Gravida  3   Para  2   Term      Preterm      AB      Living  2     SAB      TAB      Ectopic      Multiple      Live Births               Home Medications    Prior to Admission medications   Medication Sig Start Date End Date Taking? Authorizing Provider  atorvastatin (LIPITOR) 10 MG tablet Take by mouth. 08/12/19  Yes [provider]  bismuth subsalicylate (PEPTO BISMOL) 262 MG/15ML suspension Take 30 mLs by mouth every 6 (six) hours as needed for indigestion or diarrhea or loose stools.   Yes [provider]  escitalopram (LEXAPRO) 10 MG tablet Take 1 tablet by mouth daily. 12/20/18  Yes [provider]  Insulin Glargine (LANTUS SOLOSTAR) 100 UNIT/ML Solostar Pen Inject 10 Units into the skin daily.  04/18/19  Yes [provider]  JANUVIA 100 MG tablet Take 100 mg by mouth daily. 08/20/18  Yes [provider]  lisinopril (ZESTRIL) 2.5 MG tablet TAKE 1 TABLET BY MOUTH EVERY DAY HYPERTENSION 08/12/19  Yes [provider]  Multiple Vitamin (MULTIVITAMIN WITH MINERALS) TABS tablet Take 1 tablet by mouth daily.   Yes [provider]  OZEMPIC 1 MG/DOSE SOPN Inject 1 mg into the skin every  Saturday.  01/17/18  Yes [provider]  cephALEXin (KEFLEX) 500 MG capsule Take 1 capsule (500 mg total) by mouth 4 (four) times daily. Patient not taking: Reported on 09/29/2019 02/17/19   Milton Ferguson, MD  cephALEXin (KEFLEX) 500 MG capsule Take 1 capsule (500 mg total) by mouth 3 (three) times daily for 5 days. 09/29/19 10/04/19  Cheek, Comer Locket, PA-C  cetirizine (ZYRTEC) 10 MG tablet Take 10 mg by mouth daily.    [provider]  metroNIDAZOLE (FLAGYL) 500 MG tablet Take 1 tablet (500 mg total) by mouth 2 (two) times daily. 09/29/19   Jonette Eva, PA-C    Family History Family History  Problem Relation Age of Onset  . Diabetes Maternal Grandmother   . Colon polyps Mother   . Colon cancer Neg Hx   . Rectal cancer Neg Hx   . Stomach  cancer Neg Hx     Social History Social History   Tobacco Use  . Smoking status: Current Every Day Smoker    Packs/day: 0.75    Years: 1.00    Pack years: 0.75    Last attempt to quit: 10/04/2015    Years since quitting: 3.9  . Smokeless tobacco: Never Used  Substance Use Topics  . Alcohol use: Yes    Comment: occasionally  . Drug use: No     Allergies   Aspirin and Neosporin [neomycin-bacitracin zn-polymyx]   Review of Systems Review of Systems  Constitutional: Negative for chills and fever.  Respiratory: Negative for shortness of breath.   Cardiovascular: Negative for chest pain.  Gastrointestinal: Positive for nausea. Negative for abdominal distention, abdominal pain, diarrhea and vomiting.  Genitourinary: Positive for vaginal discharge and vaginal pain. Negative for difficulty urinating, dysuria, flank pain, hematuria and pelvic pain.  All other systems reviewed and are negative.    Physical Exam Updated Vital Signs BP 138/64 (BP Location: Right Arm)   Pulse 82   Temp 98.1 F (36.7 C) (Oral)   Resp 19   Ht 5\' 4"  (1.626 m)   Wt 117.9 kg   SpO2 99%   BMI 44.63 kg/m   Physical Exam Vitals signs and nursing note reviewed. Exam conducted with a chaperone present.  Constitutional:      General: She is not in acute distress.    Appearance: She is not ill-appearing.  HENT:     Head: Normocephalic.     Mouth/Throat:     Mouth: Mucous membranes are moist.     Pharynx: Oropharynx is clear. No oropharyngeal exudate or posterior oropharyngeal erythema.     Comments: No thrush noted Neck:     Musculoskeletal: Normal range of motion and neck supple.  Cardiovascular:     Rate and Rhythm: Normal rate and regular rhythm.     Pulses: Normal pulses.     Heart sounds: Normal heart sounds. No murmur. No friction rub. No gallop.   Pulmonary:     Effort: Pulmonary effort is normal.     Breath sounds: Normal breath sounds.     Comments: Clear to auscultation  bilaterally Abdominal:     General: Abdomen is flat. Bowel sounds are normal. There is no distension.     Palpations: Abdomen is soft.     Tenderness: There is no abdominal tenderness. There is no right CVA tenderness or left CVA tenderness.     Comments: No tenderness to palpation  Genitourinary:    Exam position: Supine.     Labia:  Right: Tenderness present.        Left: Tenderness present.      Vagina: No foreign body. Vaginal discharge (mild white discharge) present. No erythema or bleeding.     Cervix: Discharge (mild white discharge coming from os) present. No cervical motion tenderness.     Adnexa: Right adnexa normal and left adnexa normal.     Comments: Edematous and erythematous labia bilaterally. No abscess present. Neurological:     General: No focal deficit present.     Mental Status: She is alert.      ED Treatments / Results  Labs (all labs ordered are listed, but only abnormal results are displayed) Labs Reviewed  WET PREP, GENITAL - Abnormal; Notable for the following components:      Result Value   Clue Cells Wet Prep HPF POC PRESENT (*)    WBC, Wet Prep HPF POC FEW (*)    All other components within normal limits  URINALYSIS, ROUTINE W REFLEX MICROSCOPIC - Abnormal; Notable for the following components:   Color, Urine STRAW (*)    APPearance HAZY (*)    Glucose, UA >=500 (*)    Hgb urine dipstick SMALL (*)    Ketones, ur 5 (*)    Leukocytes,Ua LARGE (*)    Bacteria, UA RARE (*)    All other components within normal limits  BASIC METABOLIC PANEL - Abnormal; Notable for the following components:   Sodium 133 (*)    Chloride 96 (*)    Glucose, Bld 332 (*)    All other components within normal limits  CBG MONITORING, ED - Abnormal; Notable for the following components:   Glucose-Capillary 391 (*)    All other components within normal limits  URINE CULTURE  PREGNANCY, URINE  GC/CHLAMYDIA PROBE AMP ([place]) NOT AT [facility]    EKG None   Radiology No results found.  Procedures Procedures (including critical care time)  Medications Ordered in ED Medications  ondansetron (ZOFRAN-ODT) disintegrating tablet 4 mg (4 mg Oral Given 09/29/19 1051)  ibuprofen (ADVIL) tablet 800 mg (800 mg Oral Given 09/29/19 1051)     Initial Impression / Assessment and Plan / ED Course  I have reviewed the triage vital signs and the nursing notes.  Pertinent labs & imaging results that were available during my care of the patient were reviewed by me and considered in my medical decision making (see chart for details).       Kelli [name] is a 48 year old female who presents to the ED for an evaluation of vaginal itching, discharge, and pain x 3 days. Upon arrival, vitals all within normal limits. Patient is afebrile, normal heart rate, and no hypoxic. On physical exam, patient is in no acute distress and non-toxic appearing. Abdomen soft, non-distended with no tenderness. No CVA tenderness bilaterally. Given patient recently was treated for UTI, will obtain UA to check for signs of infection. Will perform a pelvic exam with wet prep. Patient is only sexually active with her husband, but notes she would like to be tested for gonorrhea and chlamydia.   Pelvic exam significant for edema and erythema around labia bilaterally, but no abscess present. Mild white discharge from os. No CMT. UA significant for >500 glucose and small amount of ketones. Will order CBG to check glucose level. Also demonstrated large amount of leukocytes. Wet prep with clue cells. Will treat for BV and UTI. Sent urine cultures. CBG 391. Suspect elevated glucose levels because patient has not taken insulin  today. Will order BMP to rule out DKA. If BMP is unremarkable. Will treat patient for UTI and BV.   Patient does not have a PCP. Will give patient information for PCP. Instructed patient to follow-up with PCP to recheck glucose levels and ensure she is on the correct amount  of insulin. Strict ED return precautions discussed with patient. Patient states understanding and agrees to plan. Patient discharged home in no acute distress and vitals WNL.  Final Clinical Impressions(s) / ED Diagnoses   Final diagnoses:  Acute cystitis with hematuria  Bacterial vaginosis  Elevated glucose    ED Discharge Orders         Ordered    metroNIDAZOLE (FLAGYL) 500 MG tablet  2 times daily     09/29/19 1220    cephALEXin (KEFLEX) 500 MG capsule  3 times daily     09/29/19 1220           Jonette Eva, PA-C 09/29/19 1443    Noemi Chapel, MD 09/30/19 [phone]

## 2019-09-29 NOTE — ED Notes (Signed)
Pt undressed. Pelvic materials set up at bedside.

## 2019-09-29 NOTE — Discharge Instructions (Addendum)
You have been prescribed 2 antibiotics. One for a UTI and one for bacterial vaginosis. Please take antibiotic as prescribed and finish all antibiotics. I have also given you a number for a primary care doctor. I recommend calling them and making an appointment within the next week to establish care. Return to the ED for new or worsening symptoms. As discussed, do not drink alcohol while on Flagyl. Return to the ER for new or worsening symptoms. Please have your PCP recheck glucose level and amount of insulin.

## 2019-09-29 NOTE — ED Triage Notes (Addendum)
Vaginal itching, swelling and pain since Saturday.  Tried at home one day tx with no relief.  Recently tx for UTI.  Also thinks she has thrush, tongue feels raw and irritated.

## 2019-09-30 LAB — GC/CHLAMYDIA PROBE AMP (~~LOC~~) NOT AT ARMC
Chlamydia: NEGATIVE
Neisseria Gonorrhea: NEGATIVE

## 2019-10-01 LAB — URINE CULTURE: Culture: <10000 — AB

## 2019-11-25 ENCOUNTER — Emergency Department (HOSPITAL_COMMUNITY)
Admission: EM | Admit: 2019-11-25 | Discharge: 2019-11-25 | Disposition: A | Discharge status: Home / Self Care | Payer: Self-pay | Attending: Emergency Medicine | Admitting: Emergency Medicine

## 2019-11-25 ENCOUNTER — Other Ambulatory Visit: Payer: Self-pay

## 2019-11-25 ENCOUNTER — Encounter (HOSPITAL_COMMUNITY): Payer: Self-pay | Admitting: Emergency Medicine

## 2019-11-25 ENCOUNTER — Emergency Department (HOSPITAL_COMMUNITY): Payer: Self-pay

## 2019-11-25 DIAGNOSIS — I10 Essential (primary) hypertension: Secondary | ICD-10-CM | POA: Insufficient documentation

## 2019-11-25 DIAGNOSIS — J209 Acute bronchitis, unspecified: Secondary | ICD-10-CM | POA: Insufficient documentation

## 2019-11-25 DIAGNOSIS — Z794 Long term (current) use of insulin: Secondary | ICD-10-CM | POA: Insufficient documentation

## 2019-11-25 DIAGNOSIS — Z20828 Contact with and (suspected) exposure to other viral communicable diseases: Secondary | ICD-10-CM | POA: Insufficient documentation

## 2019-11-25 DIAGNOSIS — F17210 Nicotine dependence, cigarettes, uncomplicated: Secondary | ICD-10-CM | POA: Insufficient documentation

## 2019-11-25 DIAGNOSIS — Z79899 Other long term (current) drug therapy: Secondary | ICD-10-CM | POA: Insufficient documentation

## 2019-11-25 DIAGNOSIS — E119 Type 2 diabetes mellitus without complications: Secondary | ICD-10-CM | POA: Insufficient documentation

## 2019-11-25 MED ORDER — AZITHROMYCIN 250 MG PO TABS: ORAL_TABLET | ORAL | 0 refills | Status: DC

## 2019-11-25 NOTE — ED Triage Notes (Signed)
Pt reports cough x 1 mo, chest congestion, headache x 3 days, fatigue x 1 day, denies COVID exposure but states she went Christmas shopping 3 days ago

## 2019-11-25 NOTE — Discharge Instructions (Signed)
Begin taking Zithromax as prescribed.  Take over-the-counter medications as needed for symptom relief.  Isolate yourself at home until the results of your Covid test are known.  This should be within the next day or so.         Infection Prevention Recommendations for Individuals Confirmed to have, or Being Evaluated for, 2019 Novel Coronavirus (COVID-19) Infection Who Receive Care at Home  Individuals who are confirmed to have, or are being evaluated for, COVID-19 should follow the prevention steps below until a healthcare provider or local or state health department says they can return to normal activities.  Stay home except to get medical care You should restrict activities outside your home, except for getting medical care. Do not go to work, school, or public areas, and do not use public transportation or taxis.  Call ahead before visiting your doctor Before your medical appointment, call the healthcare provider and tell them that you have, or are being evaluated for, COVID-19 infection. This will help the healthcare provider's office take steps to keep other people from getting infected. Ask your healthcare provider to call the local or state health department.  Monitor your symptoms Seek prompt medical attention if your illness is worsening (e.g., difficulty breathing). Before going to your medical appointment, call the healthcare provider and tell them that you have, or are being evaluated for, COVID-19 infection. Ask your healthcare provider to call the local or state health department.  Wear a facemask You should wear a facemask that covers your nose and mouth when you are in the same room with other people and when you visit a healthcare provider. People who live with or visit you should also wear a facemask while they are in the same room with you.  Separate yourself from other people in your home As much as possible, you should stay in a different room from other  people in your home. Also, you should use a separate bathroom, if available.  Avoid sharing household items You should not share dishes, drinking glasses, cups, eating utensils, towels, bedding, or other items with other people in your home. After using these items, you should wash them thoroughly with soap and water.  Cover your coughs and sneezes Cover your mouth and nose with a tissue when you cough or sneeze, or you can cough or sneeze into your sleeve. Throw used tissues in a lined trash can, and immediately wash your hands with soap and water for at least 20 seconds or use an alcohol-based hand rub.  Wash your [facility] your hands often and thoroughly with soap and water for at least 20 seconds. You can use an alcohol-based hand sanitizer if soap and water are not available and if your hands are not visibly dirty. Avoid touching your eyes, nose, and mouth with unwashed hands.   Prevention Steps for Caregivers and Household Members of Individuals Confirmed to have, or Being Evaluated for, COVID-19 Infection Being Cared for in the Home  If you live with, or provide care at home for, a person confirmed to have, or being evaluated for, COVID-19 infection please follow these guidelines to prevent infection:  Follow healthcare provider's instructions Make sure that you understand and can help the patient follow any healthcare provider instructions for all care.  Provide for the patient's basic needs You should help the patient with basic needs in the home and provide support for getting groceries, prescriptions, and other personal needs.  Monitor the patient's symptoms If they are getting sicker,  call his or her medical provider and tell them that the patient has, or is being evaluated for, COVID-19 infection. This will help the healthcare provider's office take steps to keep other people from getting infected. Ask the healthcare provider to call the local or state health  department.  Limit the number of people who have contact with the patient If possible, have only one caregiver for the patient. Other household members should stay in another home or place of residence. If this is not possible, they should stay in another room, or be separated from the patient as much as possible. Use a separate bathroom, if available. Restrict visitors who do not have an essential need to be in the home.  Keep older adults, very young children, and other sick people away from the patient Keep older adults, very young children, and those who have compromised immune systems or chronic health conditions away from the patient. This includes people with chronic heart, lung, or kidney conditions, diabetes, and cancer.  Ensure good ventilation Make sure that shared spaces in the home have good air flow, such as from an air conditioner or an opened window, weather permitting.  Wash your hands often Wash your hands often and thoroughly with soap and water for at least 20 seconds. You can use an alcohol based hand sanitizer if soap and water are not available and if your hands are not visibly dirty. Avoid touching your eyes, nose, and mouth with unwashed hands. Use disposable paper towels to dry your hands. If not available, use dedicated cloth towels and replace them when they become wet.  Wear a facemask and gloves Wear a disposable facemask at all times in the room and gloves when you touch or have contact with the patient's blood, body fluids, and/or secretions or excretions, such as sweat, saliva, sputum, nasal mucus, vomit, urine, or feces.  Ensure the mask fits over your nose and mouth tightly, and do not touch it during use. Throw out disposable facemasks and gloves after using them. Do not reuse. Wash your hands immediately after removing your facemask and gloves. If your personal clothing becomes contaminated, carefully remove clothing and launder. Wash your hands after  handling contaminated clothing. Place all used disposable facemasks, gloves, and other waste in a lined container before disposing them with other household waste. Remove gloves and wash your hands immediately after handling these items.  Do not share dishes, glasses, or other household items with the patient Avoid sharing household items. You should not share dishes, drinking glasses, cups, eating utensils, towels, bedding, or other items with a patient who is confirmed to have, or being evaluated for, COVID-19 infection. After the person uses these items, you should wash them thoroughly with soap and water.  Wash laundry thoroughly Immediately remove and wash clothes or bedding that have blood, body fluids, and/or secretions or excretions, such as sweat, saliva, sputum, nasal mucus, vomit, urine, or feces, on them. Wear gloves when handling laundry from the patient. Read and follow directions on labels of laundry or clothing items and detergent. In general, wash and dry with the warmest temperatures recommended on the label.  Clean all areas the individual has used often Clean all touchable surfaces, such as counters, tabletops, doorknobs, bathroom fixtures, toilets, phones, keyboards, tablets, and bedside tables, every day. Also, clean any surfaces that may have blood, body fluids, and/or secretions or excretions on them. Wear gloves when cleaning surfaces the patient has come in contact with. Use a diluted bleach solution (  e.g., dilute bleach with 1 part bleach and 10 parts water) or a household disinfectant with a label that says EPA-registered for coronaviruses. To make a bleach solution at home, add 1 tablespoon of bleach to 1 quart (4 cups) of water. For a larger supply, add  cup of bleach to 1 gallon (16 cups) of water. Read labels of cleaning products and follow recommendations provided on product labels. Labels contain instructions for safe and effective use of the cleaning product  including precautions you should take when applying the product, such as wearing gloves or eye protection and making sure you have good ventilation during use of the product. Remove gloves and wash hands immediately after cleaning.  Monitor yourself for signs and symptoms of illness Caregivers and household members are considered close contacts, should monitor their health, and will be asked to limit movement outside of the home to the extent possible. Follow the monitoring steps for close contacts listed on the symptom monitoring form.   ? If you have additional questions, contact your local health department or call the epidemiologist on call at [phone] (available 24/7). ? This guidance is subject to change. For the most up-to-date guidance from [facility], please refer to their website: [email or web address]

## 2019-11-25 NOTE — ED Provider Notes (Signed)
[facility] EMERGENCY DEPARTMENT Provider Note   CSN: [id number] Arrival date & time: 11/25/19  [id number]     History Chief Complaint  Patient presents with  . Cough    Kelli [name] is a 48 y.o. female.  Patient is a 48 year old female with past medical history of diabetes, hypertension, anxiety.  She presents today for evaluation of cough.  Patient reports this is been present for the past month and is worsening over the past 3 days.  She describes headache and fatigue that is increasing.  She denies fevers or chills.  She denies any definite exposures to Covid.  The history is provided by the patient.  Cough Cough characteristics:  Non-productive Severity:  Moderate Onset quality:  Gradual Duration:  4 weeks Timing:  Constant Progression:  Worsening Chronicity:  New Relieved by:  Nothing Worsened by:  Nothing      Past Medical History:  Diagnosis Date  . Allergy   . Anemia   . Arthritis    knee  . Chronic headache   . Colon polyps 03/28/11  . Depression   . Diabetes mellitus   . Diverticulosis   . GAD (generalized anxiety disorder)   . Gallstones   . GERD (gastroesophageal reflux disease)   . Heart murmur   . Hyperlipidemia   . Migraines   . Obesity     Patient Active Problem List   Diagnosis Date Noted  . Diabetes mellitus 02/25/2011  . Hypertension 02/25/2011  . Hyperlipidemia 02/25/2011  . Depression 02/25/2011  . Generalized anxiety disorder 02/25/2011  . CONSTIPATION 02/22/2011    Past Surgical History:  Procedure Laterality Date  . [place]  . CHOLECYSTECTOMY  1992  . COLONOSCOPY    . POLYPECTOMY    . RHINOPLASTY  1993  . TUBAL LIGATION Bilateral 1995     OB History    Gravida  3   Para  2   Term      Preterm      AB      Living  2     SAB      TAB      Ectopic      Multiple      Live Births              Family History  Problem Relation Age of Onset  . Diabetes Maternal Grandmother   .  Colon polyps Mother   . Colon cancer Neg Hx   . Rectal cancer Neg Hx   . Stomach cancer Neg Hx     Social History   Tobacco Use  . Smoking status: Current Every Day Smoker    Packs/day: 0.75    Years: 1.00    Pack years: 0.75    Last attempt to quit: 10/04/2015    Years since quitting: 4.1  . Smokeless tobacco: Never Used  Substance Use Topics  . Alcohol use: Yes    Comment: occasionally  . Drug use: No    Home Medications Prior to Admission medications   Medication Sig Start Date End Date Taking? Authorizing Provider  atorvastatin (LIPITOR) 10 MG tablet Take by mouth. 08/12/19   [provider]  bismuth subsalicylate (PEPTO BISMOL) 262 MG/15ML suspension Take 30 mLs by mouth every 6 (six) hours as needed for indigestion or diarrhea or loose stools.    [provider]  cephALEXin (KEFLEX) 500 MG capsule Take 1 capsule (500 mg total) by mouth 4 (four) times daily. Patient not taking: Reported  on 09/29/2019 02/17/19   Milton Ferguson, MD  cetirizine (ZYRTEC) 10 MG tablet Take 10 mg by mouth daily.    [provider]  escitalopram (LEXAPRO) 10 MG tablet Take 1 tablet by mouth daily. 12/20/18   [provider]  Insulin Glargine (LANTUS SOLOSTAR) 100 UNIT/ML Solostar Pen Inject 10 Units into the skin daily.  04/18/19   [provider]  JANUVIA 100 MG tablet Take 100 mg by mouth daily. 08/20/18   [provider]  lisinopril (ZESTRIL) 2.5 MG tablet TAKE 1 TABLET BY MOUTH EVERY DAY HYPERTENSION 08/12/19   [provider]  metroNIDAZOLE (FLAGYL) 500 MG tablet Take 1 tablet (500 mg total) by mouth 2 (two) times daily. 09/29/19   Suzy Bouchard, PA-C  Multiple Vitamin (MULTIVITAMIN WITH MINERALS) TABS tablet Take 1 tablet by mouth daily.    [provider]  OZEMPIC 1 MG/DOSE SOPN Inject 1 mg into the skin every Saturday.  01/17/18   [provider]    Allergies    Aspirin and Neosporin [neomycin-bacitracin  zn-polymyx]  Review of Systems   Review of Systems  Respiratory: Positive for cough.   All other systems reviewed and are negative.   Physical Exam Updated Vital Signs BP 137/85 (BP Location: Right Arm)   Pulse 98   Temp 98.1 F (36.7 C) (Oral)   Resp 20   Ht 5\' 4"  (1.626 m)   Wt 117.9 kg   LMP 11/25/2019   SpO2 98%   BMI 44.63 kg/m   Physical Exam Vitals and nursing note reviewed.  Constitutional:      General: She is not in acute distress.    Appearance: She is well-developed. She is not diaphoretic.  HENT:     Head: Normocephalic and atraumatic.  Cardiovascular:     Rate and Rhythm: Normal rate and regular rhythm.     Heart sounds: No murmur. No friction rub. No gallop.   Pulmonary:     Effort: Pulmonary effort is normal. No respiratory distress.     Breath sounds: Normal breath sounds. No wheezing.  Abdominal:     General: Bowel sounds are normal. There is no distension.     Palpations: Abdomen is soft.     Tenderness: There is no abdominal tenderness.  Musculoskeletal:        General: Normal range of motion.     Cervical back: Normal range of motion and neck supple.     Right lower leg: No edema.     Left lower leg: No edema.  Skin:    General: Skin is warm and dry.  Neurological:     Mental Status: She is alert and oriented to person, place, and time.     ED Results / Procedures / Treatments   Labs (all labs ordered are listed, but only abnormal results are displayed) Labs Reviewed  SARS CORONAVIRUS 2 (TAT 6-24 HRS)    EKG None  Radiology No results found.  Procedures Procedures (including critical care time)  Medications Ordered in ED Medications - No data to display  ED Course  I have reviewed the triage vital signs and the nursing notes.  Pertinent labs & imaging results that were available during my care of the patient were reviewed by me and considered in my medical decision making (see chart for details).  Patient is a 48 year old  female presenting with a 1 month history of cough and congestion, worse over the past few days.  Patient's lung exam is clear the chest  x-ray shows no radiographic evidence of cardiopulmonary disease.  Covid test is pending.  Patient symptoms sound more like bronchitis and Covid.  She will be treated with antibiotics and as needed return.  MDM Rules/Calculators/A&P   Final Clinical Impression(s) / ED Diagnoses Final diagnoses:  None    Rx / DC Orders ED Discharge Orders    None       Veryl Speak, MD 11/25/19 2149

## 2019-11-26 LAB — SARS CORONAVIRUS 2 (TAT 6-24 HRS): SARS Coronavirus 2: NEGATIVE

## 2020-06-09 IMAGING — DX DG SHOULDER 2+V*L*
3 series · 3 of 3 positions shown · non-contrast
Comparison: None.

CLINICAL DATA: Left shoulder pain after fall 3 days ago.

EXAM:
LEFT SHOULDER - 2+ VIEW

[shoulder grashey]
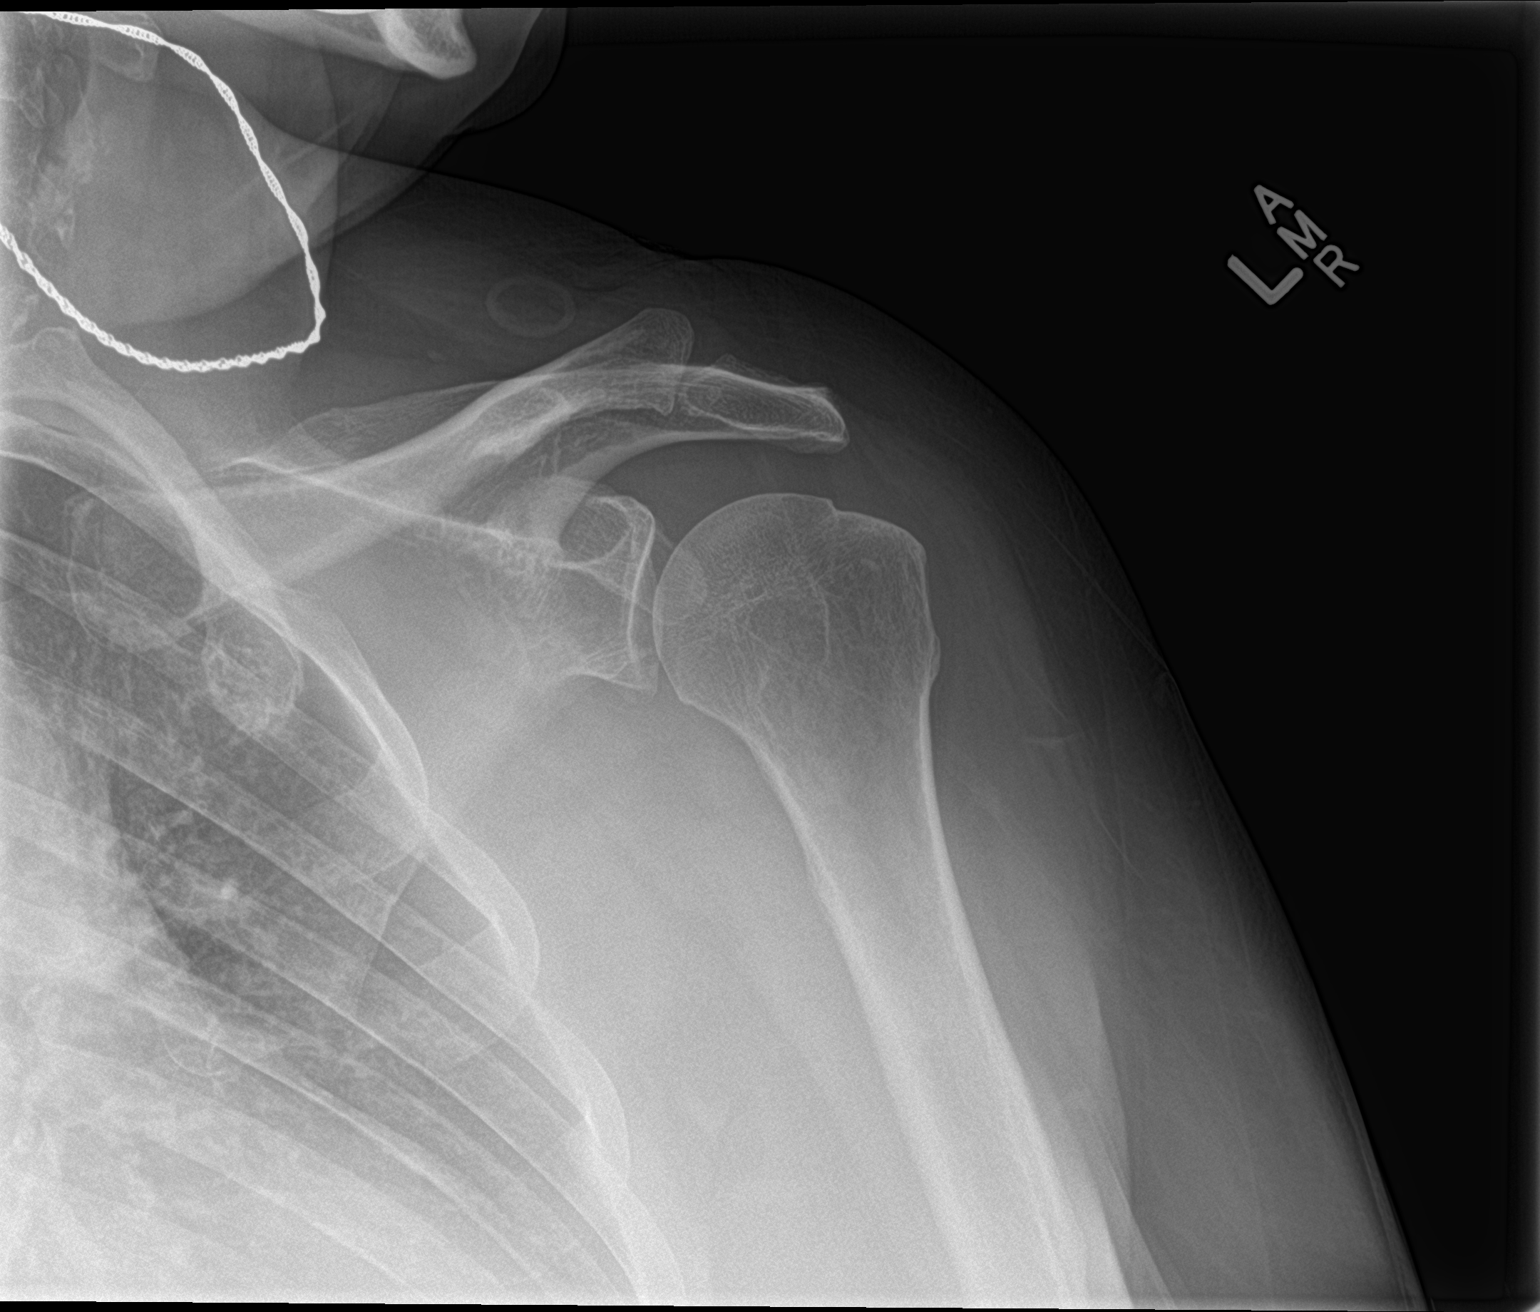

[shoulder y view]
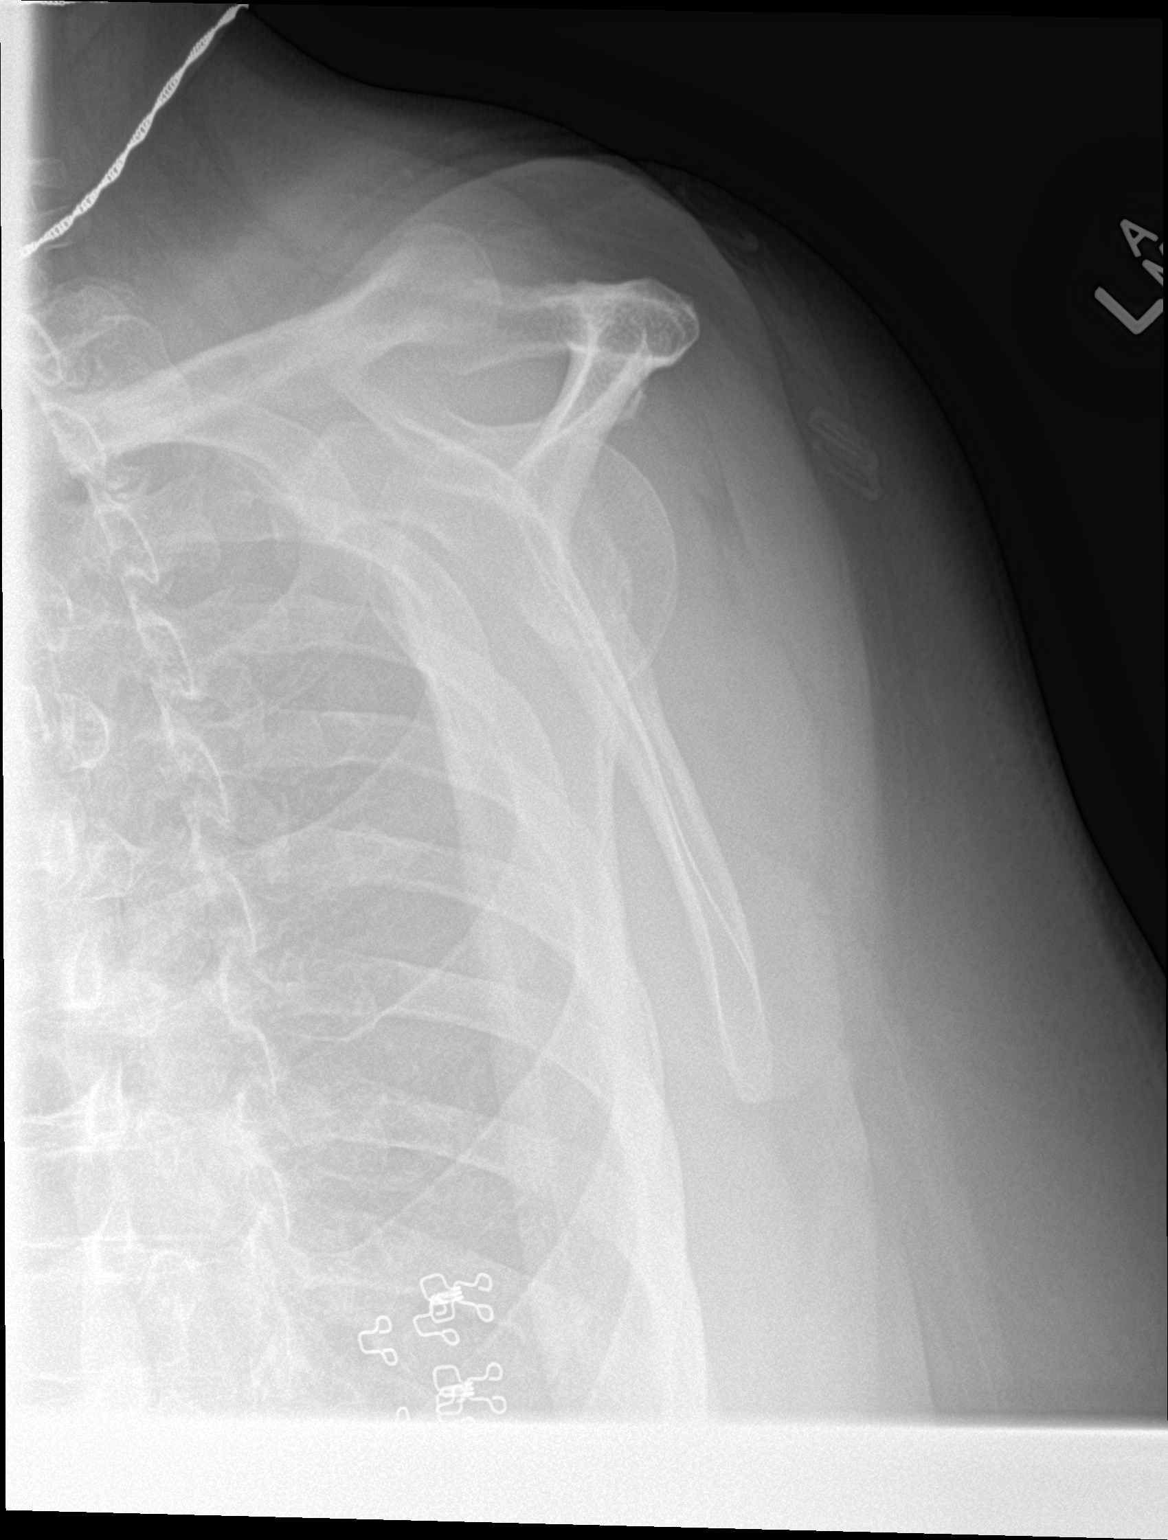

[shoulder axillary]
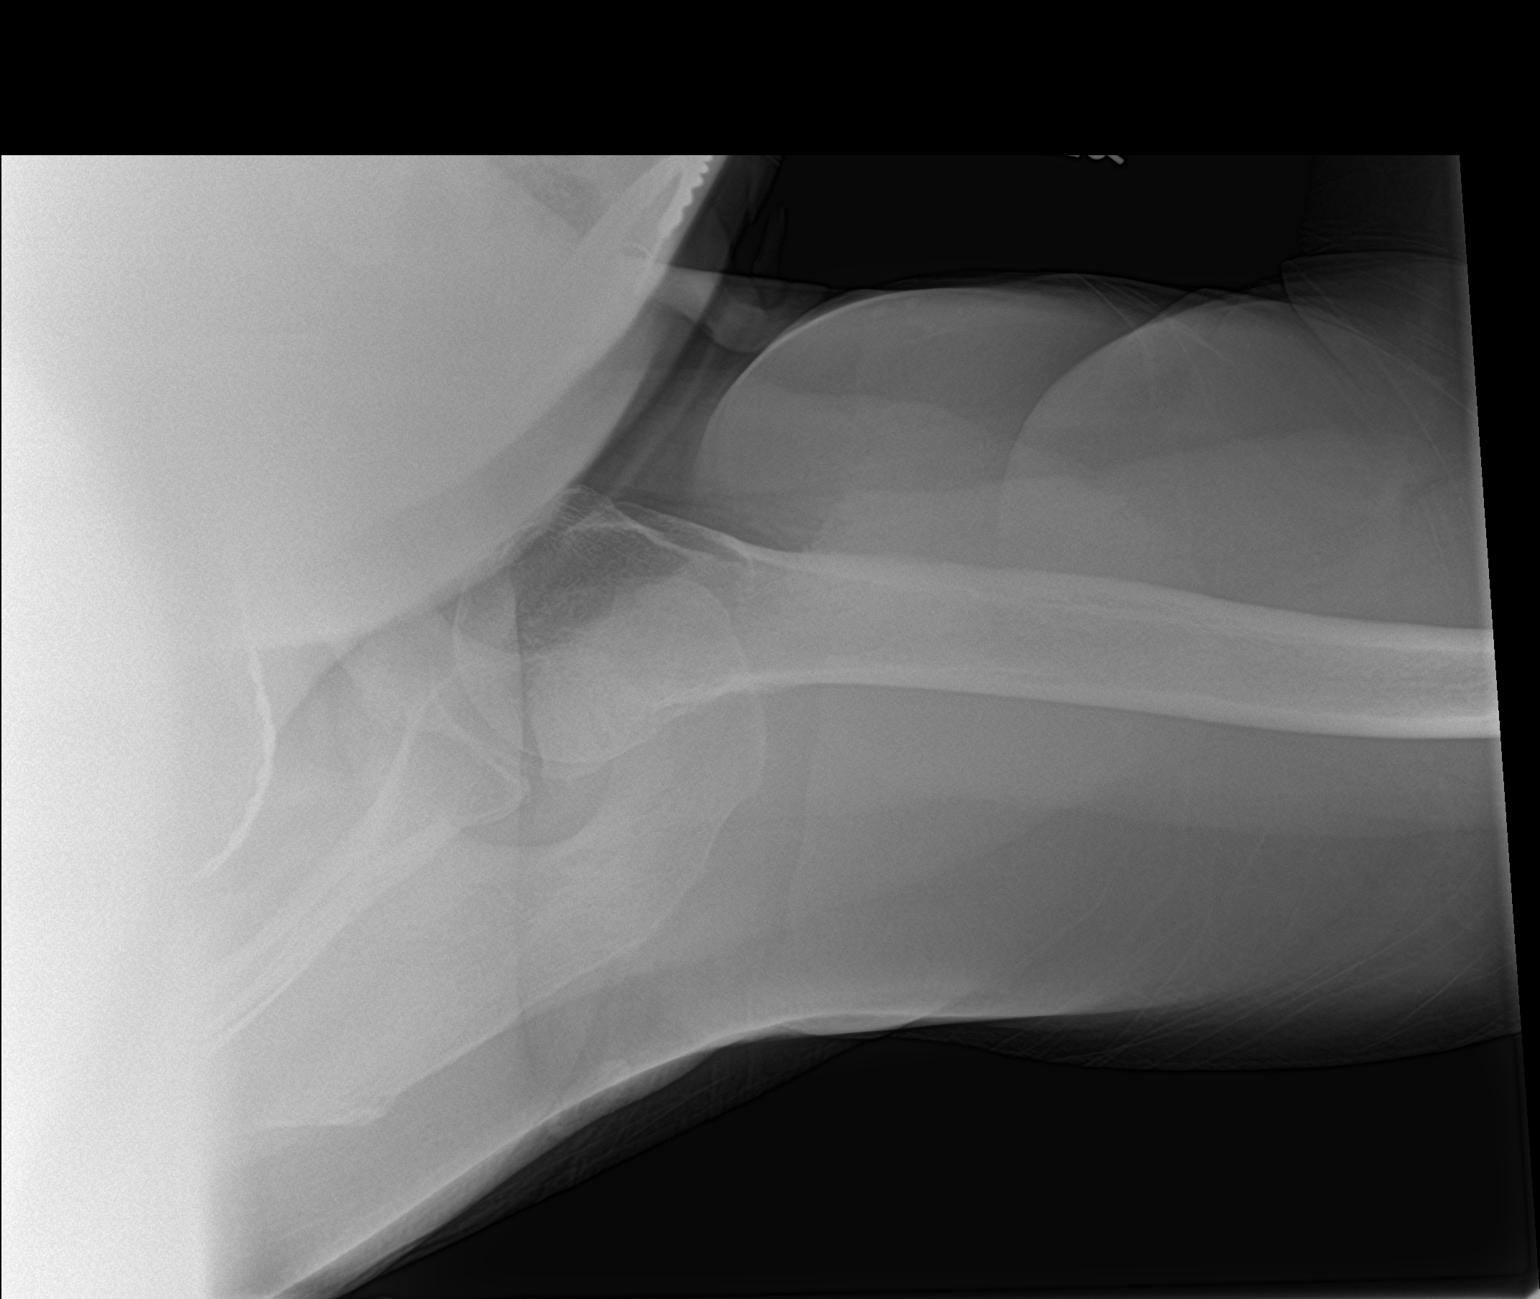

[3 of 3 positions shown; findings below may reference images not displayed]

FINDINGS: There is no evidence of fracture or dislocation. There is no
evidence of arthropathy or other focal bone abnormality. Soft
tissues are unremarkable.
IMPRESSION: Normal left shoulder.

## 2020-12-14 ENCOUNTER — Encounter (HOSPITAL_COMMUNITY): Payer: Self-pay | Admitting: *Deleted

## 2020-12-14 ENCOUNTER — Emergency Department (HOSPITAL_COMMUNITY): Payer: No Typology Code available for payment source

## 2020-12-14 ENCOUNTER — Other Ambulatory Visit: Payer: Self-pay

## 2020-12-14 ENCOUNTER — Emergency Department (HOSPITAL_COMMUNITY)
Admission: EM | Admit: 2020-12-14 | Discharge: 2020-12-14 | Disposition: A | Discharge status: Home / Self Care | Payer: No Typology Code available for payment source | Attending: Emergency Medicine | Admitting: Emergency Medicine

## 2020-12-14 DIAGNOSIS — R509 Fever, unspecified: Secondary | ICD-10-CM | POA: Diagnosis present

## 2020-12-14 DIAGNOSIS — Z794 Long term (current) use of insulin: Secondary | ICD-10-CM | POA: Insufficient documentation

## 2020-12-14 DIAGNOSIS — F17200 Nicotine dependence, unspecified, uncomplicated: Secondary | ICD-10-CM | POA: Diagnosis not present

## 2020-12-14 DIAGNOSIS — Z20822 Contact with and (suspected) exposure to covid-19: Secondary | ICD-10-CM | POA: Diagnosis not present

## 2020-12-14 DIAGNOSIS — I10 Essential (primary) hypertension: Secondary | ICD-10-CM | POA: Diagnosis not present

## 2020-12-14 DIAGNOSIS — E119 Type 2 diabetes mellitus without complications: Secondary | ICD-10-CM | POA: Diagnosis not present

## 2020-12-14 DIAGNOSIS — Z79899 Other long term (current) drug therapy: Secondary | ICD-10-CM | POA: Diagnosis not present

## 2020-12-14 DIAGNOSIS — J189 Pneumonia, unspecified organism: Secondary | ICD-10-CM | POA: Insufficient documentation

## 2020-12-14 LAB — RESP PANEL BY RT-PCR (FLU A&B, COVID) ARPGX2
Influenza A by PCR: NEGATIVE
Influenza B by PCR: NEGATIVE
SARS Coronavirus 2 by RT PCR: NEGATIVE

## 2020-12-14 MED ORDER — AZITHROMYCIN 250 MG PO TABS: ORAL_TABLET | ORAL | 0 refills | Status: AC

## 2020-12-14 NOTE — Discharge Instructions (Addendum)
As we discussed today your Covid and flu test were negative however based on your symptoms and chest x-ray I suspect that you most likely have Covid and would recommend that you quarantine in such.  Given that your chest x-ray does appear to show symptoms more on the left side than the right I will give you a prescription for antibiotics.  This is not to treat Covid rather to treat any secondary bacterial infection.  As your test was not positive, will I suspect your symptoms are Covid related, this is not 100% sure and therefore you should, after quarantine, consider getting booster and be aware that you are not considered immune from Covid at this point.  Please take Tylenol (acetaminophen) to relieve your pain.  You may take tylenol, up to 1,000 mg (two extra strength pills).  Do not take more than 3,000 mg tylenol in a 24 hour period.  Please check all medication labels as many medications such as pain and cold medications may contain tylenol. Please do not drink alcohol while taking this medication.   While in the emergency room your blood pressure was slightly elevated.  This is most likely due to stress of being in the emergency room and feeling unwell.  Please follow-up with your primary care doctor when you are no longer sick to see if your blood pressure remains elevated.  Given your high blood pressure if you choose to use a cough medicine from the drugstore I would recommend using one that does not have any decongestants as this will further raise your blood pressure.  Please consider getting Coricidin HBP which is a cough medicine formulated for people with high blood pressure.

## 2020-12-14 NOTE — ED Notes (Addendum)
Entered room and introduced self to patient. Pt appears to be resting in bed, respirations are even and unlabored with equal chest rise and fall. Bed is locked in the lowest position, side rails x2, call bell within reach. Pt educated on call light use and hourly rounding, verbalized understanding and in agreement at this time. All questions and concerns voiced addressed. Refreshments offered and provided per patient request.   PA at bedside at this time. Will continue to monitor.

## 2020-12-14 NOTE — ED Triage Notes (Signed)
Body aches and cough for 1 week

## 2020-12-14 NOTE — ED Notes (Signed)
X Ray at bedside at this time.  

## 2020-12-14 NOTE — ED Provider Notes (Signed)
[facility] EMERGENCY DEPARTMENT Provider Note   CSN: [id number] Arrival date & time: 12/14/20  1125     History Chief Complaint  Patient presents with  . Generalized Body Aches    Kelli [name] is a 50 y.o. female who presents today for evaluation of multiple symptoms including fevers at home, cough, fatigue, myalgias/arthralgias, headache, loss of sense of taste and smell, and generally feeling poorly.  She reports minimal shortness of breath.  Denies significant chest pain.  She had 2 Covid vaccines, did not get a booster. She states that her grandkids, who she has been around, were around a different family member who ended up testing positive for Covid.  The grandkids then developed symptoms however she reports that they do not have a known positive and shortly after patient developed symptoms.  She has not had a diagnosis of Covid before.    HPI     Past Medical History:  Diagnosis Date  . Allergy   . Anemia   . Arthritis    knee  . Chronic headache   . Colon polyps 03/28/11  . Depression   . Diabetes mellitus   . Diverticulosis   . GAD (generalized anxiety disorder)   . Gallstones   . GERD (gastroesophageal reflux disease)   . Heart murmur   . Hyperlipidemia   . Migraines   . Obesity     Patient Active Problem List   Diagnosis Date Noted  . Diabetes mellitus 02/25/2011  . Hypertension 02/25/2011  . Hyperlipidemia 02/25/2011  . Depression 02/25/2011  . Generalized anxiety disorder 02/25/2011  . CONSTIPATION 02/22/2011    Past Surgical History:  Procedure Laterality Date  . [place]  . CHOLECYSTECTOMY  1992  . COLONOSCOPY    . POLYPECTOMY    . RHINOPLASTY  1993  . TUBAL LIGATION Bilateral 1995     OB History    Gravida  3   Para  2   Term      Preterm      AB      Living  2     SAB      IAB      Ectopic      Multiple      Live Births              Family History  Problem Relation Age of Onset  .  Diabetes Maternal Grandmother   . Colon polyps Mother   . Colon cancer Neg Hx   . Rectal cancer Neg Hx   . Stomach cancer Neg Hx     Social History   Tobacco Use  . Smoking status: Current Every Day Smoker    Packs/day: 0.75    Years: 1.00    Pack years: 0.75    Last attempt to quit: 10/04/2015    Years since quitting: 5.2  . Smokeless tobacco: Never Used  Vaping Use  . Vaping Use: Never used  Substance Use Topics  . Alcohol use: Yes    Comment: occasionally  . Drug use: No    Home Medications Prior to Admission medications   Medication Sig Start Date End Date Taking? Authorizing Provider  azithromycin (ZITHROMAX Z-PAK) 250 MG tablet Take 2 tablets (500 mg total) by mouth daily for 1 day, THEN 1 tablet (250 mg total) daily for 4 days. 12/14/20 12/19/20 Yes Lorin Glass, PA-C  atorvastatin (LIPITOR) 10 MG tablet Take by mouth. 08/12/19   [provider]  bismuth subsalicylate (PEPTO  BISMOL) 262 MG/15ML suspension Take 30 mLs by mouth every 6 (six) hours as needed for indigestion or diarrhea or loose stools.    [provider]  cephALEXin (KEFLEX) 500 MG capsule Take 1 capsule (500 mg total) by mouth 4 (four) times daily. Patient not taking: Reported on 09/29/2019 02/17/19   Milton Ferguson, MD  cetirizine (ZYRTEC) 10 MG tablet Take 10 mg by mouth daily.    [provider]  escitalopram (LEXAPRO) 10 MG tablet Take 1 tablet by mouth daily. 12/20/18   [provider]  Insulin Glargine (LANTUS SOLOSTAR) 100 UNIT/ML Solostar Pen Inject 10 Units into the skin daily.  04/18/19   [provider]  JANUVIA 100 MG tablet Take 100 mg by mouth daily. 08/20/18   [provider]  lisinopril (ZESTRIL) 2.5 MG tablet TAKE 1 TABLET BY MOUTH EVERY DAY HYPERTENSION 08/12/19   [provider]  metroNIDAZOLE (FLAGYL) 500 MG tablet Take 1 tablet (500 mg total) by mouth 2 (two) times daily. 09/29/19   Suzy Bouchard, PA-C  Multiple Vitamin  (MULTIVITAMIN WITH MINERALS) TABS tablet Take 1 tablet by mouth daily.    [provider]  OZEMPIC 1 MG/DOSE SOPN Inject 1 mg into the skin every Saturday.  01/17/18   [provider]    Allergies    Aspirin and Neosporin [neomycin-bacitracin zn-polymyx]  Review of Systems   Review of Systems  Constitutional: Positive for chills, fatigue and fever.  HENT: Positive for congestion and sore throat.   Eyes: Negative for visual disturbance.  Respiratory: Positive for cough. Negative for shortness of breath.   Cardiovascular: Negative for chest pain.  Gastrointestinal: Positive for diarrhea, nausea and vomiting. Negative for abdominal pain.  Genitourinary: Negative for dysuria.  Musculoskeletal: Positive for arthralgias and myalgias. Negative for neck stiffness.  Skin: Negative for color change and rash.  Neurological: Positive for headaches. Negative for weakness.  All other systems reviewed and are negative.   Physical Exam Updated Vital Signs BP 129/62 (BP Location: Left Arm)   Pulse (!) 109   Temp 98 F (36.7 C) (Oral)   Resp 19   SpO2 96%   Physical Exam Vitals and nursing note reviewed.  Constitutional:      General: She is not in acute distress.    Appearance: She is not ill-appearing or diaphoretic.  HENT:     Head: Normocephalic and atraumatic.  Eyes:     General: No scleral icterus.       Right eye: No discharge.        Left eye: No discharge.     Conjunctiva/sclera: Conjunctivae normal.  Cardiovascular:     Rate and Rhythm: Normal rate and regular rhythm.     Pulses: Normal pulses.  Pulmonary:     Effort: Pulmonary effort is normal. No respiratory distress.     Breath sounds: No stridor.  Abdominal:     General: There is no distension.  Musculoskeletal:        General: No deformity.     Cervical back: Normal range of motion and neck supple.  Skin:    General: Skin is warm and dry.  Neurological:     Mental Status: She is alert.     Motor:  No abnormal muscle tone.     Comments: Awake and alert, answers all questions appropriately.  Speech is not slurred.  Psychiatric:        Mood and Affect: Mood normal.        Behavior: Behavior normal.  ED Results / Procedures / Treatments   Labs (all labs ordered are listed, but only abnormal results are displayed) Labs Reviewed  RESP PANEL BY RT-PCR (FLU A&B, COVID) [id number]    EKG None  Radiology DG Chest Portable 1 View  Result Date: 12/14/2020 CLINICAL DATA:  Cough EXAM: PORTABLE CHEST 1 VIEW COMPARISON:  11/25/2019 FINDINGS: The heart size and mediastinal contours are within normal limits. Increased interstitial prominence. Minimal patchy density at the left lung base. No pleural effusion. The visualized skeletal structures are unremarkable. IMPRESSION: Nonspecific interstitial prominence. Minimal left basilar patchy atelectasis/consolidation. May reflect atypical pneumonia in the appropriate setting. Electronically Signed   By: Guadlupe Spanish M.D.   On: 12/14/2020 15:54    Procedures Procedures (including critical care time)  Medications Ordered in ED Medications - No data to display  ED Course  I have reviewed the triage vital signs and the nursing notes.  Pertinent labs & imaging results that were available during my care of the patient were reviewed by me and considered in my medical decision making (see chart for details).    MDM Rules/Calculators/A&P                          Kelli [name] was evaluated in Emergency Department on 12/14/2020 for the symptoms described in the history of present illness. She was evaluated in the context of the global COVID-19 pandemic, which necessitated consideration that the patient might be at risk for infection with the SARS-CoV-2 virus that causes COVID-19. Institutional protocols and algorithms that pertain to the evaluation of patients at risk for COVID-19 are in a state of rapid change based on information released by  regulatory bodies including the CDC and federal and state organizations. These policies and algorithms were followed during the patient's care in the ED.  Patient is a [age]-year-old woman who presents today for evaluation of COVID-19 symptoms after she was around someone who was around someone who had positive Covid test and was symptomatic. Here her Covid test is negative.  She reports multiple symptoms including loss of sense of taste and smell.  Her chest x-ray does show concern for a atypical pneumonia, however is more left-sided.  Based on patient's multiple symptoms and contact with someone who was symptomatic after being around someone else who had a known positive Covid I suspect that her test is a false negative and she does not fact have COVID-19. I discussed this with her along with the need for appropriate quarantine.  I personally ambulated patient in the room and she maintained oxygen saturations above 98% with good waveform.  Based on the more left-sided appearance of her pneumonia without a positive Covid test I will give her a prescription for azithromycin based on her symptoms and inability to say with 100% certainty that Covid is the only cause of her symptoms.  Return precautions were discussed with patient who states their understanding.  At the time of discharge patient denied any unaddressed complaints or concerns.  Patient is agreeable for discharge home.  Note: Portions of this report may have been transcribed using voice recognition software. Every effort was made to ensure accuracy; however, inadvertent computerized transcription errors may be present   Final Clinical Impression(s) / ED Diagnoses Final diagnoses:  Suspected COVID-19 virus infection  Atypical pneumonia    Rx / DC Orders ED Discharge Orders         Ordered    azithromycin (ZITHROMAX Z-PAK) 250 MG  tablet        12/14/20 1621           Lorin Glass, [place] 12/14/20 1641    Hayden Rasmussen, MD 12/15/20 1045

## 2021-08-25 ENCOUNTER — Other Ambulatory Visit: Payer: Self-pay | Admitting: Internal Medicine

## 2021-08-25 DIAGNOSIS — R519 Headache, unspecified: Secondary | ICD-10-CM

## 2021-09-13 ENCOUNTER — Ambulatory Visit
Admission: RE | Admit: 2021-09-13 | Discharge: 2021-09-13 | Disposition: A | Discharge status: Home / Self Care | Payer: No Typology Code available for payment source | Source: Ambulatory Visit | Attending: Internal Medicine | Admitting: Internal Medicine

## 2021-09-13 DIAGNOSIS — R519 Headache, unspecified: Secondary | ICD-10-CM

## 2021-10-20 ENCOUNTER — Encounter: Payer: Self-pay | Admitting: Gastroenterology

## 2021-10-20 ENCOUNTER — Ambulatory Visit (INDEPENDENT_AMBULATORY_CARE_PROVIDER_SITE_OTHER): Payer: No Typology Code available for payment source | Admitting: Gastroenterology

## 2021-10-20 ENCOUNTER — Other Ambulatory Visit (INDEPENDENT_AMBULATORY_CARE_PROVIDER_SITE_OTHER): Payer: No Typology Code available for payment source

## 2021-10-20 VITALS — BP 140/70 | HR 88 | Ht 64.25 in | Wt 225.1 lb

## 2021-10-20 DIAGNOSIS — R634 Abnormal weight loss: Secondary | ICD-10-CM

## 2021-10-20 DIAGNOSIS — R1030 Lower abdominal pain, unspecified: Secondary | ICD-10-CM

## 2021-10-20 DIAGNOSIS — R140 Abdominal distension (gaseous): Secondary | ICD-10-CM

## 2021-10-20 DIAGNOSIS — R197 Diarrhea, unspecified: Secondary | ICD-10-CM

## 2021-10-20 LAB — CBC WITH DIFFERENTIAL/PLATELET
Basophils Absolute: 0.1 10*3/uL (ref 0.0–0.1)
Basophils Relative: 1 % (ref 0.0–3.0)
Eosinophils Absolute: 0.2 10*3/uL (ref 0.0–0.7)
Eosinophils Relative: 2.2 % (ref 0.0–5.0)
HCT: 46.4 % — ABNORMAL HIGH (ref 36.0–46.0)
Hemoglobin: 15.9 g/dL — ABNORMAL HIGH (ref 12.0–15.0)
Lymphocytes Relative: 39 % (ref 12.0–46.0)
Lymphs Abs: 2.9 10*3/uL (ref 0.7–4.0)
MCHC: 34.4 g/dL (ref 30.0–36.0)
MCV: 101.3 fl — ABNORMAL HIGH (ref 78.0–100.0)
Monocytes Absolute: 0.5 10*3/uL (ref 0.1–1.0)
Monocytes Relative: 6.2 % (ref 3.0–12.0)
Neutro Abs: 3.9 10*3/uL (ref 1.4–7.7)
Neutrophils Relative %: 51.6 % (ref 43.0–77.0)
Platelets: 166 10*3/uL (ref 150.0–400.0)
RBC: 4.58 Mil/uL (ref 3.87–5.11)
RDW: 13.6 % (ref 11.5–15.5)
WBC: 7.5 10*3/uL (ref 4.0–10.5)

## 2021-10-20 LAB — COMPREHENSIVE METABOLIC PANEL
ALT: 17 U/L (ref 0–35)
AST: 16 U/L (ref 0–37)
Albumin: 4.2 g/dL (ref 3.5–5.2)
Alkaline Phosphatase: 114 U/L (ref 39–117)
BUN: 13 mg/dL (ref 6–23)
CO2: 25 mEq/L (ref 19–32)
Calcium: 9.1 mg/dL (ref 8.4–10.5)
Chloride: 96 mEq/L (ref 96–112)
Creatinine, Ser: 0.57 mg/dL (ref 0.40–1.20)
GFR: 105.7 mL/min (ref >60.00)
Glucose, Bld: 398 mg/dL — ABNORMAL HIGH (ref 70–99)
Potassium: 4.1 mEq/L (ref 3.5–5.1)
Sodium: 132 mEq/L — ABNORMAL LOW (ref 135–145)
Total Bilirubin: 0.6 mg/dL (ref 0.2–1.2)
Total Protein: 6.8 g/dL (ref 6.0–8.3)

## 2021-10-20 LAB — SEDIMENTATION RATE: Sed Rate: 13 mm/hr (ref 0–30)

## 2021-10-20 LAB — TSH: TSH: 1.87 u[IU]/mL (ref 0.35–5.50)

## 2021-10-20 LAB — C-REACTIVE PROTEIN: CRP: <1 mg/dL (ref 0.5–20.0)

## 2021-10-20 MED ORDER — CHOLESTYRAMINE 4 G PO PACK: 4.0000 g | PACK | Freq: Every day | ORAL | 5 refills | Status: DC

## 2021-10-20 MED ORDER — NA SULFATE-K SULFATE-MG SULF 17.5-3.13-1.6 GM/177ML PO SOLN: 1.0000 | Freq: Once | ORAL | 0 refills | Status: AC

## 2021-10-20 MED ORDER — HYOSCYAMINE SULFATE 0.125 MG PO TABS: 0.1250 mg | ORAL_TABLET | Freq: Four times a day (QID) | ORAL | 2 refills | Status: DC | PRN

## 2021-10-20 NOTE — Progress Notes (Signed)
10/20/2021 Kelli [name] [id number] 12-Apr-1971   HISTORY OF PRESENT ILLNESS: This is a 50 year old female who is a patient of Dr. Ardis Hughs.  She is known to him from an office visit in 2012 and then colonoscopy in April 2019 that is listed below.  She is here today with complaints of bowel issues.  She says that she has had issues with her stomach for years since she has had her gallbladder removed.  She says that if she eats greasy foods then she immediately has to have a bowel movement.  She says that over the past year at least she has had a lot more issues.  She has very frequent diarrhea that is often bright yellow in color.  She has a lot of lower abdominal cramping.  She says that she has not seen blood in her stool.  She has lost about 35 pounds since at least December 2020.  She says that that is unintentional, but somewhat accounted for as she does not tend to eat during the day.  She works from home, but her job is quite strict and she is having issues with being able to use the restroom when needed, etc.  She is asking if I can fill out FMLA paperwork.  She is on metformin 500 mg twice daily and Tradjenta 5 mg daily and has been on both of those for at least the past year.  Colonoscopy 03/2018:  - Two 2 to 3 mm polyps in the sigmoid colon and in the ascending colon, removed with a cold snare. Resected and retrieved. - Diverticulosis in the left colon. - The examination was otherwise normal on direct and retroflexion views.   Tubular adenoma.  Past Medical History:  Diagnosis Date   Allergy    Anemia    Arthritis    knee   Chronic headache    Colon polyps 03/28/11   Depression    Diabetes mellitus    Diverticulosis    GAD (generalized anxiety disorder)    Gallstones    GERD (gastroesophageal reflux disease)    Heart murmur    Hyperlipidemia    Migraines    Obesity    Past Surgical History:  Procedure Laterality Date   [place] Bilateral 1995    reports that she has been smoking. She has a 0.75 pack-year smoking history. She has never used smokeless tobacco. She reports current alcohol use. She reports that she does not use drugs. family history includes Colon polyps in her mother; Diabetes in her maternal grandmother. Allergies  Allergen Reactions   Aspirin     Vomiting and nausea   Neosporin [Neomycin-Bacitracin Zn-Polymyx] Rash    "blisters"      Outpatient Encounter Medications as of 10/20/2021  Medication Sig   atorvastatin (LIPITOR) 10 MG tablet Take by mouth.   bismuth subsalicylate (PEPTO BISMOL) 262 MG/15ML suspension Take 30 mLs by mouth every 6 (six) hours as needed for indigestion or diarrhea or loose stools.   cephALEXin (KEFLEX) 500 MG capsule Take 1 capsule (500 mg total) by mouth 4 (four) times daily. (Patient not taking: Reported on 09/29/2019)   cetirizine (ZYRTEC) 10 MG tablet Take 10 mg by mouth daily.   escitalopram (LEXAPRO) 10 MG tablet Take 1 tablet by mouth daily.   Insulin Glargine (LANTUS SOLOSTAR) 100 UNIT/ML Solostar Pen  Inject 10 Units into the skin daily.    JANUVIA 100 MG tablet Take 100 mg by mouth daily.   lisinopril (ZESTRIL) 2.5 MG tablet TAKE 1 TABLET BY MOUTH EVERY DAY HYPERTENSION   metroNIDAZOLE (FLAGYL) 500 MG tablet Take 1 tablet (500 mg total) by mouth 2 (two) times daily.   Multiple Vitamin (MULTIVITAMIN WITH MINERALS) TABS tablet Take 1 tablet by mouth daily.   OZEMPIC 1 MG/DOSE SOPN Inject 1 mg into the skin every Saturday.    No facility-administered encounter medications on file as of 10/20/2021.     REVIEW OF SYSTEMS  : All other systems reviewed and negative except where noted in the History of Present Illness.   PHYSICAL EXAM: Ht 5' 4.25" (1.632 m) Comment: height measured without shoes  Wt 225 lb 2 oz (102.1 kg)   BMI 38.34 kg/m  General: Well developed  white female in no acute distress Head: Normocephalic and atraumatic Eyes:  Sclerae anicteric, conjunctiva pink. Ears: Normal auditory acuity Lungs: Clear throughout to auscultation; no W/R/R. Heart: Regular rate and rhythm; no M/R/G. Abdomen: Soft, non-distended.  BS present.  Mild diffuse TTP. Rectal:  Will be done at the time of colonoscopy. Musculoskeletal: Symmetrical with no gross deformities  Skin: No lesions on visible extremities Extremities: No edema  Neurological: Alert oriented x 4, grossly non-focal Psychological:  Alert and cooperative. Normal mood and affect  ASSESSMENT AND PLAN: *50 year old female with complaints of diarrhea with lower abdominal pain/cramping, bloating, and weight loss: I highly suspect that some of her diarrhea may be related to not having her gallbladder.  Likely also has component of IBS as well.  She is also on metformin 500 mg twice daily and Tradjenta daily as well, which can both cause diarrhea.  She has lost 35 pounds since December 2020, however.  She says that this is unintentional, but somewhat accounted for as she does not like to eat during the day because then she is running to the bathroom all the time.  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  She is asking me to fill out FMLA forms in hopes that she can at least get more lenient allowance for restroom breaks.  We will check labs including CBC, CMP, sed rate, CRP, TSH, and celiac labs.  We will plan for colonoscopy with Dr. Ardis Hughs.  Please perform random biopsies to rule out microscopic colitis if no other source is identified.  I am going to place her on Questran 1 packet daily and Levsin every 6 hours as needed.  Prescription sent to pharmacy.  The risks, benefits, and alternatives to colonoscopy were discussed with the patient and she consents to proceed.   CC:  Minna Antis, DO

## 2021-10-20 NOTE — Patient Instructions (Addendum)
If you are age 50 or older, your body mass index should be between 23-30. Your Body mass index is 38.34 kg/m. If this is out of the aforementioned range listed, please consider follow up with your Primary Care Provider.  If you are age 95 or younger, your body mass index should be between 19-25. Your Body mass index is 38.34 kg/m. If this is out of the aformentioned range listed, please consider follow up with your Primary Care Provider.   Your provider has requested that you go to the basement level for lab work before leaving today. Press "B" on the elevator. The lab is located at the first door on the left as you exit the elevator.  We have sent the following medications to your pharmacy for you to pick up at your convenience: Questran one packet daily. Levsin 0.125 mg every 6 hours as needed.   The [facility] providers would like to encourage you to use [facility] to communicate with providers for non-urgent requests or questions.  Due to long hold times on the telephone, sending your provider a message by [facility] may be a faster and more efficient way to get a response.  Please allow 48 business hours for a response.  Please remember that this is for non-urgent requests.   It was a pleasure to see you today!  Thank you for trusting me with your gastrointestinal care!    Alonza Bogus, PA-C

## 2021-10-21 LAB — IGA: Immunoglobulin A: 131 mg/dL (ref 47–310)

## 2021-10-21 LAB — TISSUE TRANSGLUTAMINASE, IGA: (tTG) Ab, IgA: <1 U/mL

## 2021-10-26 ENCOUNTER — Encounter: Payer: Self-pay | Admitting: Gastroenterology

## 2021-10-26 DIAGNOSIS — R197 Diarrhea, unspecified: Secondary | ICD-10-CM | POA: Insufficient documentation

## 2021-10-26 DIAGNOSIS — R1030 Lower abdominal pain, unspecified: Secondary | ICD-10-CM | POA: Insufficient documentation

## 2021-10-26 DIAGNOSIS — R634 Abnormal weight loss: Secondary | ICD-10-CM | POA: Insufficient documentation

## 2021-10-26 DIAGNOSIS — R140 Abdominal distension (gaseous): Secondary | ICD-10-CM | POA: Insufficient documentation

## 2021-10-27 ENCOUNTER — Telehealth: Payer: Self-pay | Admitting: *Deleted

## 2021-10-27 NOTE — Telephone Encounter (Signed)
Left message for patient to call office. FMLA paperwork is ready.

## 2021-10-27 NOTE — Telephone Encounter (Signed)
Patient returned call and paid for [facility] paperwork. Paperwork faxed to employer per patient's requested. Mailed original copy to patient home. Sent copy to be scanned.

## 2021-10-27 NOTE — Progress Notes (Signed)
I agree with the above note, plan 

## 2021-11-14 ENCOUNTER — Other Ambulatory Visit: Payer: Self-pay

## 2021-11-14 ENCOUNTER — Encounter: Payer: Self-pay | Admitting: Gastroenterology

## 2021-11-14 ENCOUNTER — Ambulatory Visit (AMBULATORY_SURGERY_CENTER): Payer: No Typology Code available for payment source | Admitting: Gastroenterology

## 2021-11-14 VITALS — BP 103/58 | HR 73 | Temp 97.2°F | Resp 16 | Ht 64.0 in | Wt 225.0 lb

## 2021-11-14 DIAGNOSIS — R197 Diarrhea, unspecified: Secondary | ICD-10-CM

## 2021-11-14 MED ORDER — SODIUM CHLORIDE 0.9 % IV SOLN: 500.0000 mL | Freq: Once | INTRAVENOUS | Status: DC

## 2021-11-14 NOTE — Progress Notes (Signed)
  The recent H&P (dated 10/20/2021) was reviewed, the patient was examined and there is no change in the patients condition since that H&P was completed.   Kelli [name]  11/14/2021, 11:20 AM

## 2021-11-14 NOTE — Op Note (Signed)
[facility] Patient Name: Kelli [name] Procedure Date: 11/14/2021 11:28 AM MRN: [id number] Endoscopist: Milus Banister , MD Age: 50 Referring MD:  Date of Birth: [birth year] Gender: Female Account #: [id number] Procedure:                Colonoscopy Indications:              Chronic diarrhea; h/o adenomatous polyps:                            Colonoscoyp 2019 two subCM adenomas Medicines:                Monitored Anesthesia Care Procedure:                Pre-Anesthesia Assessment:                           - Prior to the procedure, a History and Physical                            was performed, and patient medications and                            allergies were reviewed. The patient's tolerance of                            previous anesthesia was also reviewed. The risks                            and benefits of the procedure and the sedation                            options and risks were discussed with the patient.                            All questions were answered, and informed consent                            was obtained. Prior Anticoagulants: The patient has                            taken no previous anticoagulant or antiplatelet                            agents. ASA Grade Assessment: III - A patient with                            severe systemic disease. After reviewing the risks                            and benefits, the patient was deemed in                            satisfactory condition to undergo the procedure.  After obtaining informed consent, the colonoscope                            was passed under direct vision. Throughout the                            procedure, the patient's blood pressure, pulse, and                            oxygen saturations were monitored continuously. The                            Olympus PCF-H190DL ([id number]) Colonoscope was                            introduced through the anus  and advanced to the the                            terminal ileum. The colonoscopy was performed                            without difficulty. The patient tolerated the                            procedure well. The quality of the bowel                            preparation was good. The terminal ileum, ileocecal                            valve, appendiceal orifice, and rectum were                            photographed. Scope In: 11:31:31 AM Scope Out: 11:42:32 AM Scope Withdrawal Time: 0 hours 7 minutes 28 seconds  Total Procedure Duration: 0 hours 11 minutes 1 second  Findings:                 The terminal ileum appeared normal.                           Biopsies for histology were taken with a cold                            forceps from the entire colon for evaluation of                            microscopic colitis.                           The entire examined colon appeared normal on direct                            and retroflexion views. Complications:            No immediate complications. Estimated blood  loss:                            None. Estimated Blood Loss:     Estimated blood loss: none. Impression:               - The examined portion of the ileum was normal.                           - The entire examined colon is normal on direct and                            retroflexion views.                           - Biopsies were taken with a cold forceps from the                            entire colon for evaluation of microscopic colitis. Recommendation:           - Patient has a contact number available for                            emergencies. The signs and symptoms of potential                            delayed complications were discussed with the                            patient. Return to normal activities tomorrow.                            Written discharge instructions were provided to the                            patient.                            - Resume previous diet.                           - Continue present medications. Please start taking                            a single OTC imodium every morning shortly after                            waking (instead of the cholestyramine powder).                           - Await pathology results. Milus Banister, MD 11/14/2021 11:47:46 AM This report has been signed electronically.

## 2021-11-14 NOTE — Progress Notes (Signed)
PT taken to PACU. Monitors in place. VSS. Report given to RN. 

## 2021-11-14 NOTE — Progress Notes (Signed)
Called to room to assist during endoscopic procedure.  Patient ID and intended procedure confirmed with present staff. Received instructions for my participation in the procedure from the performing physician.  

## 2021-11-14 NOTE — Progress Notes (Signed)
VS taken by C.W. 

## 2021-11-14 NOTE — Patient Instructions (Signed)
YOU HAD AN ENDOSCOPIC PROCEDURE TODAY AT [facility]:   Refer to the procedure report that was given to you for any specific questions about what was found during the examination.  If the procedure report does not answer your questions, please call your gastroenterologist to clarify.  If you requested that your care partner not be given the details of your procedure findings, then the procedure report has been included in a sealed envelope for you to review at your convenience later.  YOU SHOULD EXPECT: Some feelings of bloating in the abdomen. Passage of more gas than usual.  Walking can help get rid of the air that was put into your GI tract during the procedure and reduce the bloating. If you had a lower endoscopy (such as a colonoscopy or flexible sigmoidoscopy) you may notice spotting of blood in your stool or on the toilet paper. If you underwent a bowel prep for your procedure, you may not have a normal bowel movement for a few days.  Please Note:  You might notice some irritation and congestion in your nose or some drainage.  This is from the oxygen used during your procedure.  There is no need for concern and it should clear up in a day or so.  SYMPTOMS TO REPORT IMMEDIATELY:  Following lower endoscopy (colonoscopy or flexible sigmoidoscopy):  Excessive amounts of blood in the stool  Significant tenderness or worsening of abdominal pains  Swelling of the abdomen that is new, acute  Fever of 100F or higher   For urgent or emergent issues, a gastroenterologist can be reached at any hour by calling [phone]. Do not use MyChart messaging for urgent concerns.    DIET:  We do recommend a small meal at first, but then you may proceed to your regular diet.  Drink plenty of fluids but you should avoid alcoholic beverages for 24 hours.  MEDICATIONS: Continue present medications. Please start taking a single Over The Counter Imodium every morning shortly after waking  (instead of Cholestyramine powder).  Thank you for allowing [place] to provide for your healthcare needs today.  ACTIVITY:  You should plan to take it easy for the rest of today and you should NOT DRIVE or use heavy machinery until tomorrow (because of the sedation medicines used during the test).    FOLLOW UP: Our staff will call the number listed on your records 48-72 hours following your procedure to check on you and address any questions or concerns that you may have regarding the information given to you following your procedure. If we do not reach you, we will leave a message.  We will attempt to reach you two times.  During this call, we will ask if you have developed any symptoms of COVID 19. If you develop any symptoms (ie: fever, flu-like symptoms, shortness of breath, cough etc.) before then, please call [phone].  If you test positive for Covid 19 in the 2 weeks post procedure, please call and report this information to [place].    If any biopsies were taken you will be contacted by phone or by letter within the next 1-3 weeks.  Please call us at [phone] if you have not heard about the biopsies in 3 weeks.    SIGNATURES/CONFIDENTIALITY: You and/or your care partner have signed paperwork which will be entered into your electronic medical record.  These signatures attest to the fact that that the information above on your After Visit Summary has been reviewed and is  understood.  Full responsibility of the confidentiality of this discharge information lies with you and/or your care-partner.

## 2021-11-16 ENCOUNTER — Telehealth: Payer: Self-pay | Admitting: *Deleted

## 2021-11-16 NOTE — Telephone Encounter (Signed)
First follow up call attempt.  LVM. 

## 2021-11-16 NOTE — Telephone Encounter (Signed)
Second follow up call attempt.  Spoke with female answering phone, said she was doing fine and would tell her we called.

## 2021-11-17 ENCOUNTER — Encounter: Payer: Self-pay | Admitting: Gastroenterology

## 2022-04-24 ENCOUNTER — Other Ambulatory Visit: Payer: Self-pay

## 2022-04-24 ENCOUNTER — Encounter (HOSPITAL_COMMUNITY): Payer: Self-pay | Admitting: Internal Medicine

## 2022-04-24 ENCOUNTER — Inpatient Hospital Stay (HOSPITAL_COMMUNITY)
Admission: EM | Admit: 2022-04-24 | Discharge: 2022-05-04 | DRG: 871 | Disposition: A | Discharge status: Home / Self Care | Payer: PRIVATE HEALTH INSURANCE | Attending: Family Medicine | Admitting: Family Medicine

## 2022-04-24 ENCOUNTER — Emergency Department (HOSPITAL_COMMUNITY): Payer: PRIVATE HEALTH INSURANCE

## 2022-04-24 DIAGNOSIS — L8990 Pressure ulcer of unspecified site, unspecified stage: Secondary | ICD-10-CM | POA: Diagnosis present

## 2022-04-24 DIAGNOSIS — I5020 Unspecified systolic (congestive) heart failure: Secondary | ICD-10-CM | POA: Diagnosis not present

## 2022-04-24 DIAGNOSIS — Z881 Allergy status to other antibiotic agents status: Secondary | ICD-10-CM

## 2022-04-24 DIAGNOSIS — E785 Hyperlipidemia, unspecified: Secondary | ICD-10-CM | POA: Diagnosis present

## 2022-04-24 DIAGNOSIS — I509 Heart failure, unspecified: Secondary | ICD-10-CM | POA: Diagnosis not present

## 2022-04-24 DIAGNOSIS — I110 Hypertensive heart disease with heart failure: Secondary | ICD-10-CM | POA: Diagnosis not present

## 2022-04-24 DIAGNOSIS — A4151 Sepsis due to Escherichia coli [E. coli]: Principal | ICD-10-CM | POA: Diagnosis present

## 2022-04-24 DIAGNOSIS — Z20822 Contact with and (suspected) exposure to covid-19: Secondary | ICD-10-CM | POA: Diagnosis present

## 2022-04-24 DIAGNOSIS — Z6835 Body mass index (BMI) 35.0-35.9, adult: Secondary | ICD-10-CM

## 2022-04-24 DIAGNOSIS — R140 Abdominal distension (gaseous): Secondary | ICD-10-CM

## 2022-04-24 DIAGNOSIS — R6520 Severe sepsis without septic shock: Secondary | ICD-10-CM | POA: Diagnosis present

## 2022-04-24 DIAGNOSIS — J189 Pneumonia, unspecified organism: Secondary | ICD-10-CM | POA: Diagnosis present

## 2022-04-24 DIAGNOSIS — R1314 Dysphagia, pharyngoesophageal phase: Secondary | ICD-10-CM | POA: Diagnosis present

## 2022-04-24 DIAGNOSIS — R933 Abnormal findings on diagnostic imaging of other parts of digestive tract: Secondary | ICD-10-CM | POA: Diagnosis not present

## 2022-04-24 DIAGNOSIS — K580 Irritable bowel syndrome with diarrhea: Secondary | ICD-10-CM | POA: Diagnosis present

## 2022-04-24 DIAGNOSIS — D72819 Decreased white blood cell count, unspecified: Secondary | ICD-10-CM | POA: Diagnosis not present

## 2022-04-24 DIAGNOSIS — I429 Cardiomyopathy, unspecified: Secondary | ICD-10-CM | POA: Diagnosis present

## 2022-04-24 DIAGNOSIS — N390 Urinary tract infection, site not specified: Secondary | ICD-10-CM | POA: Diagnosis present

## 2022-04-24 DIAGNOSIS — J9601 Acute respiratory failure with hypoxia: Secondary | ICD-10-CM | POA: Diagnosis present

## 2022-04-24 DIAGNOSIS — F17210 Nicotine dependence, cigarettes, uncomplicated: Secondary | ICD-10-CM | POA: Diagnosis present

## 2022-04-24 DIAGNOSIS — R0602 Shortness of breath: Secondary | ICD-10-CM | POA: Diagnosis present

## 2022-04-24 DIAGNOSIS — E876 Hypokalemia: Secondary | ICD-10-CM | POA: Diagnosis not present

## 2022-04-24 DIAGNOSIS — A419 Sepsis, unspecified organism: Secondary | ICD-10-CM | POA: Diagnosis not present

## 2022-04-24 DIAGNOSIS — F32A Depression, unspecified: Secondary | ICD-10-CM | POA: Diagnosis present

## 2022-04-24 DIAGNOSIS — J45909 Unspecified asthma, uncomplicated: Secondary | ICD-10-CM | POA: Diagnosis present

## 2022-04-24 DIAGNOSIS — K529 Noninfective gastroenteritis and colitis, unspecified: Secondary | ICD-10-CM | POA: Diagnosis not present

## 2022-04-24 DIAGNOSIS — Z8249 Family history of ischemic heart disease and other diseases of the circulatory system: Secondary | ICD-10-CM

## 2022-04-24 DIAGNOSIS — K219 Gastro-esophageal reflux disease without esophagitis: Secondary | ICD-10-CM | POA: Diagnosis present

## 2022-04-24 DIAGNOSIS — K222 Esophageal obstruction: Secondary | ICD-10-CM | POA: Diagnosis present

## 2022-04-24 DIAGNOSIS — J181 Lobar pneumonia, unspecified organism: Secondary | ICD-10-CM | POA: Diagnosis present

## 2022-04-24 DIAGNOSIS — R1319 Other dysphagia: Secondary | ICD-10-CM | POA: Diagnosis not present

## 2022-04-24 DIAGNOSIS — K5900 Constipation, unspecified: Secondary | ICD-10-CM | POA: Diagnosis not present

## 2022-04-24 DIAGNOSIS — K760 Fatty (change of) liver, not elsewhere classified: Secondary | ICD-10-CM | POA: Diagnosis present

## 2022-04-24 DIAGNOSIS — D696 Thrombocytopenia, unspecified: Secondary | ICD-10-CM | POA: Diagnosis not present

## 2022-04-24 DIAGNOSIS — L89152 Pressure ulcer of sacral region, stage 2: Secondary | ICD-10-CM | POA: Diagnosis not present

## 2022-04-24 DIAGNOSIS — Z886 Allergy status to analgesic agent status: Secondary | ICD-10-CM

## 2022-04-24 DIAGNOSIS — I5031 Acute diastolic (congestive) heart failure: Secondary | ICD-10-CM | POA: Diagnosis not present

## 2022-04-24 DIAGNOSIS — I5021 Acute systolic (congestive) heart failure: Secondary | ICD-10-CM | POA: Diagnosis not present

## 2022-04-24 DIAGNOSIS — Z79899 Other long term (current) drug therapy: Secondary | ICD-10-CM

## 2022-04-24 DIAGNOSIS — R1310 Dysphagia, unspecified: Secondary | ICD-10-CM | POA: Diagnosis not present

## 2022-04-24 DIAGNOSIS — Z833 Family history of diabetes mellitus: Secondary | ICD-10-CM

## 2022-04-24 DIAGNOSIS — Z7984 Long term (current) use of oral hypoglycemic drugs: Secondary | ICD-10-CM

## 2022-04-24 DIAGNOSIS — K761 Chronic passive congestion of liver: Secondary | ICD-10-CM | POA: Diagnosis present

## 2022-04-24 DIAGNOSIS — R1030 Lower abdominal pain, unspecified: Secondary | ICD-10-CM

## 2022-04-24 DIAGNOSIS — E1165 Type 2 diabetes mellitus with hyperglycemia: Secondary | ICD-10-CM | POA: Diagnosis present

## 2022-04-24 LAB — STREP PNEUMONIAE URINARY ANTIGEN: Strep Pneumo Urinary Antigen: POSITIVE — AB

## 2022-04-24 LAB — COMPREHENSIVE METABOLIC PANEL
ALT: 45 U/L — ABNORMAL HIGH (ref 0–44)
AST: 81 U/L — ABNORMAL HIGH (ref 15–41)
Albumin: 3 g/dL — ABNORMAL LOW (ref 3.5–5.0)
Alkaline Phosphatase: 131 U/L — ABNORMAL HIGH (ref 38–126)
Anion gap: 10 (ref 5–15)
BUN: 14 mg/dL (ref 6–20)
CO2: 24 mmol/L (ref 22–32)
Calcium: 8.4 mg/dL — ABNORMAL LOW (ref 8.9–10.3)
Chloride: 100 mmol/L (ref 98–111)
Creatinine, Ser: 0.64 mg/dL (ref 0.44–1.00)
GFR, Estimated: >60 mL/min (ref >60)
Glucose, Bld: 384 mg/dL — ABNORMAL HIGH (ref 70–99)
Potassium: 4 mmol/L (ref 3.5–5.1)
Sodium: 134 mmol/L — ABNORMAL LOW (ref 135–145)
Total Bilirubin: 1 mg/dL (ref 0.3–1.2)
Total Protein: 6.1 g/dL — ABNORMAL LOW (ref 6.5–8.1)

## 2022-04-24 LAB — CBC WITH DIFFERENTIAL/PLATELET
Band Neutrophils: 13 %
Basophils Absolute: 0 10*3/uL (ref 0.0–0.1)
Basophils Relative: 0 %
Eosinophils Absolute: 0 10*3/uL (ref 0.0–0.5)
Eosinophils Relative: 0 %
HCT: 42.4 % (ref 36.0–46.0)
Hemoglobin: 14 g/dL (ref 12.0–15.0)
Lymphocytes Relative: 24 %
Lymphs Abs: 0.4 10*3/uL — ABNORMAL LOW (ref 0.7–4.0)
MCH: 33.9 pg (ref 26.0–34.0)
MCHC: 33 g/dL (ref 30.0–36.0)
MCV: 102.7 fL — ABNORMAL HIGH (ref 80.0–100.0)
Metamyelocytes Relative: 7 %
Monocytes Absolute: 0.1 10*3/uL (ref 0.1–1.0)
Monocytes Relative: 7 %
Neutro Abs: 1.1 10*3/uL — ABNORMAL LOW (ref 1.7–7.7)
Neutrophils Relative %: 49 %
Platelets: 143 10*3/uL — ABNORMAL LOW (ref 150–400)
RBC: 4.13 MIL/uL (ref 3.87–5.11)
RDW: 13.2 % (ref 11.5–15.5)
WBC: 1.8 10*3/uL — ABNORMAL LOW (ref 4.0–10.5)
nRBC: 0 % (ref 0.0–0.2)

## 2022-04-24 LAB — URINALYSIS, ROUTINE W REFLEX MICROSCOPIC
Bilirubin Urine: NEGATIVE
Glucose, UA: >=500 mg/dL — AB
Hgb urine dipstick: NEGATIVE
Ketones, ur: 5 mg/dL — AB
Leukocytes,Ua: NEGATIVE
Nitrite: POSITIVE — AB
Protein, ur: NEGATIVE mg/dL
Specific Gravity, Urine: 1.024 (ref 1.005–1.030)
pH: 6 (ref 5.0–8.0)

## 2022-04-24 LAB — I-STAT CHEM 8, ED
BUN: 12 mg/dL (ref 6–20)
Calcium, Ion: 1.04 mmol/L — ABNORMAL LOW (ref 1.15–1.40)
Chloride: 98 mmol/L (ref 98–111)
Creatinine, Ser: 0.5 mg/dL (ref 0.44–1.00)
Glucose, Bld: 360 mg/dL — ABNORMAL HIGH (ref 70–99)
HCT: 42 % (ref 36.0–46.0)
Hemoglobin: 14.3 g/dL (ref 12.0–15.0)
Potassium: 3.5 mmol/L (ref 3.5–5.1)
Sodium: 133 mmol/L — ABNORMAL LOW (ref 135–145)
TCO2: 20 mmol/L — ABNORMAL LOW (ref 22–32)

## 2022-04-24 LAB — LACTIC ACID, PLASMA
Lactic Acid, Venous: 3.6 mmol/L (ref 0.5–1.9)
Lactic Acid, Venous: 4.2 mmol/L (ref 0.5–1.9)
Lactic Acid, Venous: 2.6 mmol/L (ref 0.5–1.9)

## 2022-04-24 LAB — MAGNESIUM: Magnesium: 1.2 mg/dL — ABNORMAL LOW (ref 1.7–2.4)

## 2022-04-24 LAB — GLUCOSE, CAPILLARY
Glucose-Capillary: 264 mg/dL — ABNORMAL HIGH (ref 70–99)
Glucose-Capillary: 269 mg/dL — ABNORMAL HIGH (ref 70–99)
Glucose-Capillary: 232 mg/dL — ABNORMAL HIGH (ref 70–99)

## 2022-04-24 LAB — PROTIME-INR
INR: 1.3 — ABNORMAL HIGH (ref 0.8–1.2)
Prothrombin Time: 16.3 seconds — ABNORMAL HIGH (ref 11.4–15.2)

## 2022-04-24 LAB — TROPONIN I (HIGH SENSITIVITY)
Troponin I (High Sensitivity): 5 ng/L (ref <18)
Troponin I (High Sensitivity): 4 ng/L (ref <18)

## 2022-04-24 LAB — RESP PANEL BY RT-PCR (FLU A&B, COVID) ARPGX2
Influenza A by PCR: NEGATIVE
Influenza B by PCR: NEGATIVE
SARS Coronavirus 2 by RT PCR: NEGATIVE

## 2022-04-24 LAB — PHOSPHORUS: Phosphorus: 2.2 mg/dL — ABNORMAL LOW (ref 2.5–4.6)

## 2022-04-24 LAB — MRSA NEXT GEN BY PCR, NASAL: MRSA by PCR Next Gen: NOT DETECTED

## 2022-04-24 LAB — HIV ANTIBODY (ROUTINE TESTING W REFLEX): HIV Screen 4th Generation wRfx: NONREACTIVE

## 2022-04-24 LAB — TSH: TSH: 1.486 u[IU]/mL (ref 0.350–4.500)

## 2022-04-24 LAB — POC URINE PREG, ED: Preg Test, Ur: NEGATIVE

## 2022-04-24 LAB — APTT: aPTT: 34 seconds (ref 24–36)

## 2022-04-24 LAB — BRAIN NATRIURETIC PEPTIDE: B Natriuretic Peptide: 209 pg/mL — ABNORMAL HIGH (ref 0.0–100.0)

## 2022-04-24 MED ORDER — INSULIN DETEMIR 100 UNIT/ML ~~LOC~~ SOLN
15.0000 [IU] | Freq: Every day | SUBCUTANEOUS | Status: DC
  Administered 2022-04-24 – 2022-04-26 (×3): 15 [IU] via SUBCUTANEOUS
  Filled 2022-04-24 (×4): qty 0.15

## 2022-04-24 MED ORDER — IPRATROPIUM BROMIDE 0.02 % IN SOLN
0.5000 mg | Freq: Four times a day (QID) | RESPIRATORY_TRACT | Status: DC
  Administered 2022-04-24 – 2022-05-03 (×34): 0.5 mg via RESPIRATORY_TRACT
  Filled 2022-04-24 (×34): qty 2.5

## 2022-04-24 MED ORDER — ONDANSETRON HCL 4 MG/2ML IJ SOLN
4.0000 mg | Freq: Four times a day (QID) | INTRAMUSCULAR | Status: DC | PRN
Start: 2022-04-24 — End: 2022-05-05
  Administered 2022-04-26: 4 mg via INTRAVENOUS
  Filled 2022-04-24: qty 2

## 2022-04-24 MED ORDER — PANTOPRAZOLE SODIUM 40 MG PO TBEC
40.0000 mg | DELAYED_RELEASE_TABLET | Freq: Every day | ORAL | Status: DC
  Administered 2022-04-24 – 2022-05-04 (×11): 40 mg via ORAL
  Filled 2022-04-24 (×11): qty 1

## 2022-04-24 MED ORDER — ESCITALOPRAM OXALATE 10 MG PO TABS
10.0000 mg | ORAL_TABLET | Freq: Every day | ORAL | Status: DC
  Administered 2022-04-24 – 2022-05-04 (×11): 10 mg via ORAL
  Filled 2022-04-24 (×12): qty 1

## 2022-04-24 MED ORDER — SODIUM CHLORIDE 0.9 % IV BOLUS
2000.0000 mL | Freq: Once | INTRAVENOUS | Status: AC
  Administered 2022-04-24: 2000 mL via INTRAVENOUS

## 2022-04-24 MED ORDER — SACCHAROMYCES BOULARDII 250 MG PO CAPS
250.0000 mg | ORAL_CAPSULE | Freq: Two times a day (BID) | ORAL | Status: DC
  Administered 2022-04-24 – 2022-05-04 (×21): 250 mg via ORAL
  Filled 2022-04-24 (×21): qty 1

## 2022-04-24 MED ORDER — INSULIN ASPART 100 UNIT/ML IJ SOLN
0.0000 [IU] | Freq: Three times a day (TID) | INTRAMUSCULAR | Status: DC
  Administered 2022-04-24 (×2): 8 [IU] via SUBCUTANEOUS

## 2022-04-24 MED ORDER — SODIUM CHLORIDE 0.9 % IV BOLUS
1000.0000 mL | Freq: Once | INTRAVENOUS | Status: AC
  Administered 2022-04-24: 1000 mL via INTRAVENOUS

## 2022-04-24 MED ORDER — DM-GUAIFENESIN ER 30-600 MG PO TB12
1.0000 | ORAL_TABLET | Freq: Two times a day (BID) | ORAL | Status: DC
  Administered 2022-04-24 – 2022-05-04 (×21): 1 via ORAL
  Filled 2022-04-24 (×21): qty 1

## 2022-04-24 MED ORDER — ACETAMINOPHEN 325 MG PO TABS
650.0000 mg | ORAL_TABLET | Freq: Once | ORAL | Status: AC
  Administered 2022-04-24: 650 mg via ORAL
  Filled 2022-04-24: qty 2

## 2022-04-24 MED ORDER — SODIUM CHLORIDE 0.9 % IV SOLN
500.0000 mg | INTRAVENOUS | Status: AC
  Administered 2022-04-24 – 2022-04-28 (×5): 500 mg via INTRAVENOUS
  Filled 2022-04-24 (×5): qty 5

## 2022-04-24 MED ORDER — ONDANSETRON HCL 4 MG PO TABS: 4.0000 mg | ORAL_TABLET | Freq: Four times a day (QID) | ORAL | Status: DC | PRN

## 2022-04-24 MED ORDER — HEPARIN SODIUM (PORCINE) 5000 UNIT/ML IJ SOLN
5000.0000 [IU] | Freq: Three times a day (TID) | INTRAMUSCULAR | Status: DC
  Administered 2022-04-24 – 2022-05-03 (×27): 5000 [IU] via SUBCUTANEOUS
  Filled 2022-04-24 (×27): qty 1

## 2022-04-24 MED ORDER — LACTATED RINGERS IV SOLN: INTRAVENOUS | Status: DC

## 2022-04-24 MED ORDER — ACETAMINOPHEN 325 MG PO TABS
650.0000 mg | ORAL_TABLET | Freq: Four times a day (QID) | ORAL | Status: DC | PRN
  Administered 2022-04-24 – 2022-05-04 (×13): 650 mg via ORAL
  Filled 2022-04-24 (×14): qty 2

## 2022-04-24 MED ORDER — FLUCONAZOLE 100 MG PO TABS
100.0000 mg | ORAL_TABLET | Freq: Every day | ORAL | Status: AC
  Administered 2022-04-24 – 2022-04-26 (×3): 100 mg via ORAL
  Filled 2022-04-24 (×3): qty 1

## 2022-04-24 MED ORDER — BUPROPION HCL ER (XL) 150 MG PO TB24
150.0000 mg | ORAL_TABLET | Freq: Every day | ORAL | Status: DC
  Administered 2022-04-24 – 2022-05-04 (×11): 150 mg via ORAL
  Filled 2022-04-24 (×12): qty 1

## 2022-04-24 MED ORDER — INSULIN ASPART 100 UNIT/ML IJ SOLN
0.0000 [IU] | Freq: Every day | INTRAMUSCULAR | Status: DC
  Administered 2022-04-24: 2 [IU] via SUBCUTANEOUS

## 2022-04-24 MED ORDER — ROSUVASTATIN CALCIUM 10 MG PO TABS
5.0000 mg | ORAL_TABLET | Freq: Every day | ORAL | Status: DC
  Administered 2022-04-24 – 2022-05-04 (×11): 5 mg via ORAL
  Filled 2022-04-24 (×12): qty 1

## 2022-04-24 MED ORDER — SUMATRIPTAN SUCCINATE 50 MG PO TABS
50.0000 mg | ORAL_TABLET | ORAL | Status: DC | PRN
  Administered 2022-04-24 – 2022-05-03 (×12): 50 mg via ORAL
  Filled 2022-04-24 (×15): qty 1

## 2022-04-24 MED ORDER — BUDESONIDE 0.5 MG/2ML IN SUSP
0.5000 mg | Freq: Two times a day (BID) | RESPIRATORY_TRACT | Status: DC
  Administered 2022-04-24 – 2022-05-04 (×20): 0.5 mg via RESPIRATORY_TRACT
  Filled 2022-04-24 (×21): qty 2

## 2022-04-24 MED ORDER — SODIUM CHLORIDE 0.9 % IV SOLN
2.0000 g | INTRAVENOUS | Status: AC
  Administered 2022-04-24 – 2022-04-28 (×5): 2 g via INTRAVENOUS
  Filled 2022-04-24 (×5): qty 20

## 2022-04-24 MED ORDER — LEVALBUTEROL HCL 0.63 MG/3ML IN NEBU
0.6300 mg | INHALATION_SOLUTION | Freq: Four times a day (QID) | RESPIRATORY_TRACT | Status: DC
  Administered 2022-04-24 – 2022-05-03 (×33): 0.63 mg via RESPIRATORY_TRACT
  Filled 2022-04-24 (×34): qty 3

## 2022-04-24 MED ORDER — MAGNESIUM SULFATE 2 GM/50ML IV SOLN
2.0000 g | Freq: Once | INTRAVENOUS | Status: AC
  Administered 2022-04-24: 2 g via INTRAVENOUS
  Filled 2022-04-24: qty 50

## 2022-04-24 MED ORDER — SODIUM CHLORIDE 0.9 % IV SOLN: 500.0000 mg | INTRAVENOUS | Status: DC

## 2022-04-24 MED ORDER — SODIUM CHLORIDE 0.9 % IV SOLN: 2.0000 g | INTRAVENOUS | Status: DC

## 2022-04-24 MED ORDER — CHOLESTYRAMINE LIGHT 4 G PO PACK
4.0000 g | PACK | Freq: Every day | ORAL | Status: DC
  Filled 2022-04-24 (×10): qty 1

## 2022-04-24 MED ORDER — CHLORHEXIDINE GLUCONATE CLOTH 2 % EX PADS
6.0000 | MEDICATED_PAD | Freq: Every day | CUTANEOUS | Status: DC
  Administered 2022-04-24 – 2022-05-04 (×11): 6 via TOPICAL

## 2022-04-24 MED ORDER — ACETAMINOPHEN 650 MG RE SUPP: 650.0000 mg | Freq: Four times a day (QID) | RECTAL | Status: DC | PRN

## 2022-04-24 NOTE — ED Provider Notes (Signed)
?[facility] ?Provider Note ? ? ?CSN: [id number] ?Arrival date & time: 04/24/22  1638 ? ?  ? ?History ? ?Chief Complaint  ?Patient presents with  ? Shortness of Breath  ? ? ?Kelli [name] is a 51 y.o. female. ? ?Patient with a history of diabetes.  She complains of cough and shortness of breath for a couple days. ? ?The history is provided by the patient and medical records. No language interpreter was used.  ?Shortness of Breath ?Severity:  Moderate ?Onset quality:  Sudden ?Timing:  Constant ?Progression:  Worsening ?Chronicity:  New ?Context: activity   ?Relieved by:  Nothing ?Worsened by:  Nothing ?Ineffective treatments:  None tried ?Associated symptoms: cough   ?Associated symptoms: no abdominal pain, no chest pain, no headaches and no rash   ? ?  ? ?Home Medications ?Prior to Admission medications   ?Medication Sig Start Date End Date Taking? Authorizing Provider  ?acetaminophen (TYLENOL) 325 MG tablet Take 650 mg by mouth as needed.    [provider]  ?albuterol (VENTOLIN HFA) 108 (90 Base) MCG/ACT inhaler Inhale 1 puff into the lungs as needed. 01/30/20   [provider]  ?bismuth subsalicylate (PEPTO BISMOL) 262 MG/15ML suspension Take 30 mLs by mouth every 6 (six) hours as needed for indigestion or diarrhea or loose stools.    [provider]  ?cholestyramine (QUESTRAN) 4 g packet Take 1 packet (4 g total) by mouth daily. 10/20/21   Zehr, Laban Emperor, PA-C  ?escitalopram (LEXAPRO) 10 MG tablet Take 1 tablet by mouth daily. 12/20/18   [provider]  ?hyoscyamine (LEVSIN) 0.125 MG tablet Take 1 tablet (0.125 mg total) by mouth every 6 (six) hours as needed. 10/20/21   Zehr, Laban Emperor, PA-C  ?loperamide (IMODIUM A-D) 2 MG tablet Take 4 mg by mouth as needed for diarrhea or loose stools.    [provider]  ?metFORMIN (GLUCOPHAGE-XR) 500 MG 24 hr tablet Take 500 mg by mouth 2 (two) times daily. 09/29/21   [provider]  ?Peppermint  Oil (IBGARD) 90 MG CPCR Take 1 capsule by mouth daily.    [provider]  ?rosuvastatin (CRESTOR) 5 MG tablet Take 1 tablet by mouth daily. 08/23/21   [provider]  ?TRADJENTA 5 MG TABS tablet Take 5 mg by mouth daily. 09/29/21   [provider]  ?   ? ?Allergies    ?Aspirin and Neosporin [neomycin-bacitracin zn-polymyx]   ? ?Review of Systems   ?Review of Systems  ?Constitutional:  Negative for appetite change and fatigue.  ?HENT:  Negative for congestion, ear discharge and sinus pressure.   ?Eyes:  Negative for discharge.  ?Respiratory:  Positive for cough and shortness of breath.   ?Cardiovascular:  Negative for chest pain.  ?Gastrointestinal:  Negative for abdominal pain and diarrhea.  ?Genitourinary:  Negative for frequency and hematuria.  ?Musculoskeletal:  Negative for back pain.  ?Skin:  Negative for rash.  ?Neurological:  Negative for seizures and headaches.  ?Psychiatric/Behavioral:  Negative for hallucinations.   ? ?Physical Exam ?Updated Vital Signs ?BP 98/66   Pulse (!) 120   Temp 98.6 ?F (37 ?C) (Oral)   Resp (!) 36   SpO2 93%  ?Physical Exam ?Vitals and nursing note reviewed.  ?Constitutional:   ?   Appearance: She is well-developed.  ?HENT:  ?   Head: Normocephalic.  ?   Nose: Nose normal.  ?Eyes:  ?   General: No scleral icterus. ?   Conjunctiva/sclera: Conjunctivae normal.  ?  Neck:  ?   Thyroid: No thyromegaly.  ?Cardiovascular:  ?   Rate and Rhythm: Normal rate and regular rhythm.  ?   Heart sounds: No murmur heard. ?  No friction rub. No gallop.  ?Pulmonary:  ?   Breath sounds: No stridor. Rales present. No wheezing.  ?Chest:  ?   Chest wall: No tenderness.  ?Abdominal:  ?   General: There is no distension.  ?   Tenderness: There is no abdominal tenderness. There is no rebound.  ?Musculoskeletal:     ?   General: Normal range of motion.  ?   Cervical back: Neck supple.  ?Lymphadenopathy:  ?   Cervical: No cervical adenopathy.  ?Skin: ?   Findings: No erythema or  rash.  ?Neurological:  ?   Mental Status: She is alert and oriented to person, place, and time.  ?   Motor: No abnormal muscle tone.  ?   Coordination: Coordination normal.  ?Psychiatric:     ?   Behavior: Behavior normal.  ? ? ?ED Results / Procedures / Treatments   ?Labs ?(all labs ordered are listed, but only abnormal results are displayed) ?Labs Reviewed  ?CBC WITH DIFFERENTIAL/PLATELET - Abnormal; Notable for the following components:  ?    Result Value  ? WBC 1.8 (*)   ? MCV 102.7 (*)   ? Platelets 143 (*)   ? Neutro Abs 1.1 (*)   ? Lymphs Abs 0.4 (*)   ? All other components within normal limits  ?COMPREHENSIVE METABOLIC PANEL - Abnormal; Notable for the following components:  ? Sodium 134 (*)   ? Glucose, Bld 384 (*)   ? Calcium 8.4 (*)   ? Total Protein 6.1 (*)   ? Albumin 3.0 (*)   ? AST 81 (*)   ? ALT 45 (*)   ? Alkaline Phosphatase 131 (*)   ? All other components within normal limits  ?BRAIN NATRIURETIC PEPTIDE - Abnormal; Notable for the following components:  ? B Natriuretic Peptide 209.0 (*)   ? All other components within normal limits  ?LACTIC ACID, PLASMA - Abnormal; Notable for the following components:  ? Lactic Acid, Venous 3.6 (*)   ? All other components within normal limits  ?LACTIC ACID, PLASMA - Abnormal; Notable for the following components:  ? Lactic Acid, Venous 4.2 (*)   ? All other components within normal limits  ?I-STAT CHEM 8, ED - Abnormal; Notable for the following components:  ? Sodium 133 (*)   ? Glucose, Bld 360 (*)   ? Calcium, Ion 1.04 (*)   ? TCO2 20 (*)   ? All other components within normal limits  ?RESP PANEL BY RT-PCR (FLU A&B, COVID) ARPGX2  ?CULTURE, BLOOD (ROUTINE X 2)  ?CULTURE, BLOOD (ROUTINE X 2)  ?URINE CULTURE  ?URINALYSIS, ROUTINE W REFLEX MICROSCOPIC  ?PROTIME-INR  ?APTT  ?POC URINE PREG, ED  ?TROPONIN I (HIGH SENSITIVITY)  ? ? ?EKG ?EKG Interpretation ? ?Date/Time:  Monday Apr 24 2022 06:52:01 EDT ?Ventricular Rate:  122 ?PR Interval:  138 ?QRS  Duration: 90 ?QT Interval:  331 ?QTC Calculation: 472 ?R Axis:   8 ?Text Interpretation: Sinus tachycardia Probable left atrial enlargement Anterior infarct, old Confirmed by Milton Ferguson [phone]) on 04/24/2022 7:41:07 AM ? ?Radiology ?DG Chest Port 1 View ? ?Result Date: 04/24/2022 ?CLINICAL DATA:  Short of breath EXAM: PORTABLE CHEST 1 VIEW COMPARISON:  12/14/2020 FINDINGS: New consolidative opacities in the left lung. No pleural effusion. No pneumothorax. Heart size is  normal. IMPRESSION: Multifocal left-sided pneumonia. Electronically Signed   By: Macy Mis M.D.   On: 04/24/2022 07:46   ? ?Procedures ?Procedures  ? ? ?Medications Ordered in ED ?Medications  ?cefTRIAXone (ROCEPHIN) 2 g in sodium chloride 0.9 % 100 mL IVPB (2 g Intravenous New Bag/Given 04/24/22 0825)  ?azithromycin (ZITHROMAX) 500 mg in sodium chloride 0.9 % 250 mL IVPB (500 mg Intravenous New Bag/Given 04/24/22 5409)  ?heparin injection 5,000 Units (has no administration in time range)  ?sodium chloride 0.9 % bolus 2,000 mL (2,000 mLs Intravenous New Bag/Given 04/24/22 8119)  ?acetaminophen (TYLENOL) tablet 650 mg (650 mg Oral Given 04/24/22 0837)  ? ? ?ED Course/ Medical Decision Making/ A&P ? CRITICAL CARE ?Performed by: Milton Ferguson ?Total critical care time: 40 minutes ?Critical care time was exclusive of separately billable procedures and treating other patients. ?Critical care was necessary to treat or prevent imminent or life-threatening deterioration. ?Critical care was time spent personally by me on the following activities: development of treatment plan with patient and/or surrogate as well as nursing, discussions with consultants, evaluation of patient's response to treatment, examination of patient, obtaining history from patient or surrogate, ordering and performing treatments and interventions, ordering and review of laboratory studies, ordering and review of radiographic studies, pulse oximetry and re-evaluation of patient's  condition. ? ?                        ?Medical Decision Making ?Amount and/or Complexity of Data Reviewed ?Labs: ordered. ?Radiology: ordered. ?ECG/medicine tests: ordered. ? ?Risk ?OTC drugs. ?Decision regarding hospitalizat

## 2022-04-24 NOTE — Assessment & Plan Note (Addendum)
Continue statin. 

## 2022-04-24 NOTE — Progress Notes (Signed)
Notified bedside nurse of need to draw repeat lactic acid @ 0930.  ?

## 2022-04-24 NOTE — Assessment & Plan Note (Addendum)
-  In the setting of IBS -Continue the use of Questran and Florastor. -Appears to be stable/improved overall.

## 2022-04-24 NOTE — ED Notes (Signed)
Attempted report to 300 hall ?

## 2022-04-24 NOTE — Progress Notes (Signed)
Patient arrived to room I09 in NAD, VS stable and patient free from pain. Patient oriented to room and call bell in reach.  ?

## 2022-04-24 NOTE — Progress Notes (Signed)
Elink following code sepsis °

## 2022-04-24 NOTE — ED Notes (Signed)
Date and time results received: 04/24/22 0834 ? ?Test: Lactic acid ?Critical Value: 3.6 ? ?Name of Provider Notified: Dr. Roderic Palau ? ?Orders Received? Or Actions Taken?: notified ?

## 2022-04-24 NOTE — Assessment & Plan Note (Addendum)
-  In the setting of multilobar pneumonia (left Lung) -Patient oxygen saturation in the mid 80s on room air; -need of 8 L high flow nasal cannula to maintain saturation above 92%. -now up to 12 L>>9L>>3L>>2L>1.5L -completed 5 days ceftriaxone/zithromax -Patient reporting difficulty swallowing for over 2 weeks and with high concern for aspiration.   Speech therapy has seen patient and at this moment recommendation for GI involvement -repeat BNP--113 -repeat PCT--1.31 -IS and flutter valve 4/20--repeat CXR--personally reviewed>>increase RLL opacity 5/21 CTA chest--No PE--Diffuse patchy and confluent ground-glass opacities throughout both lungs

## 2022-04-24 NOTE — Plan of Care (Signed)

## 2022-04-24 NOTE — Assessment & Plan Note (Addendum)
-  No suicidal ideation or hallucinations -Continue Lexapro and Wellbutrin.

## 2022-04-24 NOTE — TOC Progression Note (Signed)
?  Transition of Care (TOC) Screening Note ? ? ?Patient Details  ?Name: Kelli [name] ?Date of Birth: 03-01-1971 ? ? ?Transition of Care (TOC) CM/SW Contact:    ?Shade Flood, LCSW ?Phone Number: ?04/24/2022, 9:41 AM ? ? ? ?Transition of Care Department [facility]) has reviewed patient and no TOC needs have been identified at this time. We will continue to monitor patient advancement through interdisciplinary progression rounds. If new patient transition needs arise, please place a TOC consult. ? ? ?

## 2022-04-24 NOTE — Progress Notes (Signed)
Notified bedside nurse of need to draw repeat lactic acid. 

## 2022-04-24 NOTE — Progress Notes (Signed)
Notified provider and bedside nurse of need to order and administer fluid bolus, needs 3063 cc.Marland Kitchen  ?

## 2022-04-24 NOTE — Assessment & Plan Note (Addendum)
-  Patient with hyperglycemia -04/24/22 A1c 11.5 -Continue sliding scale insulin and Levemir; follow CBGs and adjust hypoglycemia regimen as needed. -Holding oral hypoglycemic agents while inpatient. CBGs largely controlled currently continue novolog 5 units TIW CBG (last 3)  Recent Labs    05/02/22 0755 05/02/22 1108 05/02/22 1715  GLUCAP 159* 331* 253*

## 2022-04-24 NOTE — Evaluation (Signed)
Clinical/Bedside Swallow Evaluation ?Patient Details  ?Name: Kelli [name] ?MRN: [id number] ?Date of Birth: May 27, 1971 ? ?Today's Date: 04/24/2022 ?Time: SLP Start Time (ACUTE ONLY): 1600 SLP Stop Time (ACUTE ONLY): 9371 ?SLP Time Calculation (min) (ACUTE ONLY): 24 min ? ?Past Medical History:  ?Past Medical History:  ?Diagnosis Date  ? Allergy   ? Anemia   ? 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 Chronic headache   ? Colon polyps 03/28/11  ? Depression   ? Diabetes mellitus   ? Diverticulosis   ? GAD (generalized anxiety disorder)   ? Gallstones   ? GERD (gastroesophageal reflux disease)   ? Heart murmur   ? Hyperlipidemia   ? Migraines   ? Obesity   ? ?Past Surgical History:  ?Past Surgical History:  ?Procedure Laterality Date  ? [place]  ? CHOLECYSTECTOMY  1992  ? COLONOSCOPY    ? POLYPECTOMY    ? RHINOPLASTY  1993  ? TUBAL LIGATION Bilateral 1995  ? ?HPI:  ?Kelli [name] is a 51 y.o. female with medical history significant of prior history of asthma (per patient not a big issue lately and has never been using her home inhaler), type 2 diabetes mellitus, depression, gastroesophageal reflux disease, hyperlipidemia class II obesity; who presented to the emergency department secondary to productive coughing spells, shortness of breath, general malaise and dysuria.  Patient reports symptom has been present for the last 3-4 days and worsening.  She has noticed that in the last 24 hours she having very winded with minimal activity and just feeling completely drained.  Reports productive coughing spells (yellowish/greenish mucus, no hemoptysis), expressed being exposed to her grandkids (with URI symptoms) reported experiencing chills. In the ED chest x-ray demonstrated multilobar pneumonia affecting her left lung, she was leukopenic, tachypneic, tachycardic and hypoxic.  Lactic acid 4.2.  Cultures were taken, fluid resuscitation per sepsis protocol initiated and IV antibiotics started.  TRH contacted to place  patient in the hospital for further evaluation and management. BSE requested due to Pt with PNA and regurgitating after meals.  ?  ?Assessment / Plan / Recommendation  ?Clinical Impression ? Clinical swallow evaluation completed at bedside. Oral motor examination is WNL. Pt reports that for the past several months, she has regurgitated foods/liquids and reports globus sensation (points to neck and chest). She did not exhibit signs of aspiration during self presentation of thins via cup/straw, puree, or regular textures. She reportedly consumed a sandwhich earlier today and drank liquids and then started coughing and vomited and desaturated. Pt smokes cigarettes. Suspect esophageal component to her dysphagia, however could have a pharyngeal component. Recommend barium pill esophagram if MD is in agreement and then SLP will determine if MBSS needed (suspect primary esophageal dysphagia at this time due to her symptoms). Consider allowing full liquids until esophageal assessment. SLP will follow. ?SLP Visit Diagnosis: Dysphagia, unspecified (R13.10) ?   ?Aspiration Risk ? Mild aspiration risk (emesis)  ?  ?Diet Recommendation Thin liquid;Dysphagia 1 (Puree) (or full liquids)  ? ?Liquid Administration via: Cup;Straw ?Medication Administration: Whole meds with liquid ?Supervision: Patient able to self feed ?Compensations: Slow rate;Small sips/bites ?Postural Changes: Seated upright at 90 degrees;Remain upright for at least 30 minutes after po intake  ?  ?Other  Recommendations Recommended Consults: Consider esophageal assessment;Consider GI evaluation ?Oral Care Recommendations: Oral care BID;Patient independent with oral care ?Other Recommendations: Clarify dietary restrictions   ? ?Recommendations for follow up therapy are one component of a multi-disciplinary discharge planning process, led  by the attending physician.  Recommendations may be updated based on patient status, additional functional criteria and insurance  authorization. ? ?Follow up Recommendations No SLP follow up  ? ? ?  ?Assistance Recommended at Discharge None  ?Functional Status Assessment Patient has had a recent decline in their functional status and demonstrates the ability to make significant improvements in function in a reasonable and predictable amount of time.  ?Frequency and Duration min 2x/week  ?1 week ?  ?   ? ?Prognosis Prognosis for Safe Diet Advancement: Good  ? ?  ? ?Swallow Study   ?General Date of Onset: 04/24/22 ?HPI: Kelli [name] is a 51 y.o. female with medical history significant of prior history of asthma (per patient not a big issue lately and has never been using her home inhaler), type 2 diabetes mellitus, depression, gastroesophageal reflux disease, hyperlipidemia class II obesity; who presented to the emergency department secondary to productive coughing spells, shortness of breath, general malaise and dysuria.  Patient reports symptom has been present for the last 3-4 days and worsening.  She has noticed that in the last 24 hours she having very winded with minimal activity and just feeling completely drained.  Reports productive coughing spells (yellowish/greenish mucus, no hemoptysis), expressed being exposed to her grandkids (with URI symptoms) reported experiencing chills. In the ED chest x-ray demonstrated multilobar pneumonia affecting her left lung, she was leukopenic, tachypneic, tachycardic and hypoxic.  Lactic acid 4.2.  Cultures were taken, fluid resuscitation per sepsis protocol initiated and IV antibiotics started.  TRH contacted to place patient in the hospital for further evaluation and management. BSE requested due to Pt with PNA and regurgitating after meals. ?Type of Study: Bedside Swallow Evaluation ?Previous Swallow Assessment: N/A ?Diet Prior to this Study: NPO ?Temperature Spikes Noted: No ?Respiratory Status: Nasal cannula ?History of Recent Intubation: No ?Behavior/Cognition: Alert;Cooperative;Pleasant  mood ?Oral Cavity Assessment: Within Functional Limits ?Oral Care Completed by SLP: No ?Oral Cavity - Dentition: Adequate natural dentition ?Vision: Functional for self-feeding ?Self-Feeding Abilities: Able to feed self ?Patient Positioning: Upright in bed ?Baseline Vocal Quality: Normal ?Volitional Cough: Strong ?Volitional Swallow: Able to elicit  ?  ?Oral/Motor/Sensory Function Overall Oral Motor/Sensory Function: Within functional limits   ?Ice Chips Ice chips: Within functional limits ?Presentation: Spoon   ?Thin Liquid Thin Liquid: Within functional limits ?Presentation: Cup;Self Fed;Straw  ?  ?Nectar Thick Nectar Thick Liquid: Not tested   ?Honey Thick Honey Thick Liquid: Not tested   ?Puree Puree: Within functional limits ?Presentation: Self Fed;Spoon   ?Solid ? ? ?  Solid: Within functional limits ?Presentation: Self Fed  ? ?  ?Thank you, ? ?Genene Churn, [place] ?[phone] ? ?Kelli [name] ?04/24/2022,5:47 PM ? ? ? ? ?

## 2022-04-24 NOTE — Assessment & Plan Note (Addendum)
-  Meeting severe sepsis criteria at time of admission with leukopenia, fever, tachycardia, presence of lactic acidosis (lactic acid 4.2), increased respiratory rate and the presence of hypoxia as part of organ dysfunction. -Lactic acid peaked 4.2 -Patient ended requiring up to 8 L high flow>>12L>>3L -finished 5 days ceftriaxone and azithro

## 2022-04-24 NOTE — ED Notes (Signed)
Dr. Verbalized pt can drink water ?

## 2022-04-24 NOTE — H&P (Addendum)
?History and Physical  ? ? ?Patient: Kelli [name] XTG:[id number] DOB: 1971/03/02 ?DOA: 04/24/2022 ?DOS: the patient was seen and examined on 04/24/2022 ?PCP: Levan Nation, MD  ?Patient coming from: Home ? ?Chief Complaint:  ?Chief Complaint  ?Patient presents with  ? Shortness of Breath  ? ?HPI: Kelli [name] is a 51 y.o. female with medical history significant of prior history of asthma (per patient not a big issue lately and has never been using her home inhaler), type 2 diabetes mellitus, depression, gastroesophageal reflux disease, hyperlipidemia class II obesity; who presented to the emergency department secondary to productive coughing spells, shortness of breath, general malaise and dysuria.  Patient reports symptom has been present for the last 3-4 days and worsening.  She has noticed that in the last 24 hours she having very winded with minimal activity and just feeling completely drained.  Reports productive coughing spells (yellowish/greenish mucus, no hemoptysis), expressed being exposed to her grandkids (with URI symptoms) reported experiencing chills. ?Patient denies chest pain, no nausea or vomiting, pain, no hematuria, no hematochezia, no melena, no focal weakness, headache or any other complaints. ? ?In the ED chest x-ray demonstrated multilobar pneumonia affecting her left lung, she was leukopenic, tachypneic, tachycardic and hypoxic.  Lactic acid 4.2.  Cultures were taken, fluid resuscitation per sepsis protocol initiated and IV antibiotics started.  TRH contacted to place patient in the hospital for further evaluation and management. ? ?Of note, COVID/influenza PCR negative. ? ?Review of Systems: As mentioned in the history of present illness. All other systems reviewed and are negative. ?Past Medical History:  ?Diagnosis Date  ? Allergy   ? Anemia   ? 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 Chronic headache   ? Colon polyps 03/28/11  ? Depression   ? Diabetes mellitus   ? Diverticulosis   ? GAD  (generalized anxiety disorder)   ? Gallstones   ? GERD (gastroesophageal reflux disease)   ? Heart murmur   ? Hyperlipidemia   ? Migraines   ? Obesity   ? ?Past Surgical History:  ?Procedure Laterality Date  ? [place]  ? CHOLECYSTECTOMY  1992  ? COLONOSCOPY    ? POLYPECTOMY    ? RHINOPLASTY  1993  ? TUBAL LIGATION Bilateral 1995  ? ?Social History:  reports that she has been smoking cigarettes. She has a 0.75 pack-year smoking history. She has never used smokeless tobacco. She reports current alcohol use. She reports that she does not use drugs. ? ?Allergies  ?Allergen Reactions  ? Aspirin   ?  Vomiting and nausea  ? Neosporin [Neomycin-Bacitracin Zn-Polymyx] Rash  ?  "blisters"  ? ? ?Family History  ?Problem Relation Age of Onset  ? Colon polyps Mother   ? Heart disease Mother   ? Diabetes Maternal Grandmother   ? Kidney failure Daughter   ? Colon cancer Neg Hx   ? Rectal cancer Neg Hx   ? Stomach cancer Neg Hx   ? Esophageal cancer Neg Hx   ? ? ?Prior to Admission medications   ?Medication Sig Start Date End Date Taking? Authorizing Provider  ?acetaminophen (TYLENOL) 325 MG tablet Take 650 mg by mouth as needed.    [provider]  ?albuterol (VENTOLIN HFA) 108 (90 Base) MCG/ACT inhaler Inhale 1 puff into the lungs as needed. 01/30/20   [provider]  ?bismuth subsalicylate (PEPTO BISMOL) 262 MG/15ML suspension Take 30 mLs by mouth every 6 (six) hours as needed for indigestion or diarrhea  or loose stools.    [provider]  ?cholestyramine (QUESTRAN) 4 g packet Take 1 packet (4 g total) by mouth daily. 10/20/21   Zehr, Laban Emperor, PA-C  ?escitalopram (LEXAPRO) 10 MG tablet Take 1 tablet by mouth daily. 12/20/18   [provider]  ?hyoscyamine (LEVSIN) 0.125 MG tablet Take 1 tablet (0.125 mg total) by mouth every 6 (six) hours as needed. 10/20/21   Zehr, Laban Emperor, PA-C  ?loperamide (IMODIUM A-D) 2 MG tablet Take 4 mg by mouth as needed for diarrhea or loose  stools.    [provider]  ?metFORMIN (GLUCOPHAGE-XR) 500 MG 24 hr tablet Take 500 mg by mouth 2 (two) times daily. 09/29/21   [provider]  ?Peppermint Oil (IBGARD) 90 MG CPCR Take 1 capsule by mouth daily.    [provider]  ?rosuvastatin (CRESTOR) 5 MG tablet Take 1 tablet by mouth daily. 08/23/21   [provider]  ?TRADJENTA 5 MG TABS tablet Take 5 mg by mouth daily. 09/29/21   [provider]  ? ? ?Physical Exam: ?Vitals:  ? 04/24/22 0830 04/24/22 0900 04/24/22 0909 04/24/22 0913  ?BP: 98/66 112/65    ?Pulse: (!) 120  (!) 119 (!) 123  ?Resp: (!) 36  (!) 29 (!) 23  ?Temp:      ?TempSrc:      ?SpO2: 93%  90%   ? ?General exam: Alert, awake, oriented x 3; having difficulty speaking in full sentences, demonstrating intermittent coughing spells and mild use of accessory muscles.  Oxygen saturation in place to keep O2 sat above 90% (4 L). ?Respiratory system: Positive rhonchi bilaterally (left more than right), mild expiratory wheezing, positive tachypnea, mild use of accessory muscles especially with exertion appreciated. ?Cardiovascular system: Sinus tachycardia, no rubs, no gallops, no JVD. ?Gastrointestinal system: Abdomen is obese, nondistended, soft and nontender. No organomegaly or masses felt. Normal bowel sounds heard. ?Central nervous system: Alert and oriented. No focal neurological deficits. ?Extremities: No cyanosis or clubbing; trace edema appreciated bilaterally. ?Skin: No petechiae. ?Psychiatry: Judgement and insight appear normal. Mood & affect appropriate.  ? ?Data Reviewed: ?Urinalysis demonstrating positive nitrite with negative leukocyte esterase ?Urine culture ordered and pending currently. ?Negative troponin ?Lactic acid 4.2 ?CBC with leukopenia showing WBCs of 1.8, hemoglobin 14.0 and platelet count 143 K ?Complete metabolic panel with sodium of 134, potassium 4.0, glucose 384, BUN 14, creatinine 0.64, AST 81, ALT 45 and alkaline phosphatase  131 ?TSH within normal limits ?Magnesium 1.2 ? ? ?Assessment and Plan: ?* Acute respiratory failure with hypoxia (HCC) ?-In the setting of multilobar pneumonia (left Lung) ?-Patient oxygen saturation in the mid 80s on room air; requiring 4 L nasal cannula supplementation to keep saturation above 90%. ?-Will wean oxygen as tolerated ?-Started on Rocephin and Zithromax as part of pneumonia protocol antibiotic therapy ?-Providing bronchodilators and mucolytic management ?-Follow clinical response. ? ?Chronic diarrhea ?-In the setting of IBS ?-Continue the use of Questran and Florastor. ?-Appears to be stable overall. ? ?Severe sepsis ([place]) ?- Meeting severe sepsis criteria at time of admission with leukopenia, fever, tachycardia, presence of lactic acidosis (lactic acid 4.2), increased respiratory rate and the presence of hypoxia as part of organ dysfunction. ?-Chest x-ray at demonstrating multilobar pneumonia affecting patient's left lung as source of infection. ?-Sepsis protocol initiated in the ED with fluid resuscitation following acquisition of 30 mL/kg (given a total of 3 L bolus provided) will continue aggressive hydration, follow culture results and provide IV antibiotics. ? ? ?Depression ?-  No suicidal ideation or hallucination ?-Continue the use of Lexapro and Wellbutrin ? ?Hyperlipidemia ?- Continue statins. ? ?Uncontrolled type 2 diabetes mellitus with hyperglycemia, without long-term current use of insulin ([place]) ?-Patient with hyperglycemia ?-Will update A1c ?-Sliding scale insulin and Levemir has been started while inpatient ?-Holding oral hypoglycemic agents ?-Modified carbohydrate diet ordered. ? ?Hypomagnesemia ?-Electrolytes will be repleted and will follow trend ? ?Class II obesity ?-Low calorie diet, portion control and increase physical activity discussed with patient. ? ?UTI ?-no hematuria ?-patient reproted dysuria and itching. ?-current antibiotics will cover UTI ?-start diflucan ?-follow  culture results ?-maintain adequate hydration. ? ? ? Advance Care Planning:   Code Status: Full Code  ? ?Consults: None ? ?Family Communication: No family at bedside. ? ?Severity of Illness: ?The appropriate patient st

## 2022-04-24 NOTE — ED Triage Notes (Signed)
Pt came in with c/o SOB, cough, fever and urinary frequency. Pt is diabetic and her BGL has been in 500s. Resp symptoms have been going on a few days, but UTI sx started yesterday. RA sats 88% and pt is tachypneic at 32 per minute. Coarse lung sounds prior to cough. Pt HR is 123. Afebrile currently. Pt has been having fevers for two days. Pt A+O times four.  ?

## 2022-04-24 NOTE — Progress Notes (Signed)
Notified bedside nurse ( new nurse)of need to draw repeat lactic acid.  ?

## 2022-04-25 ENCOUNTER — Inpatient Hospital Stay (HOSPITAL_COMMUNITY): Payer: PRIVATE HEALTH INSURANCE

## 2022-04-25 DIAGNOSIS — R1310 Dysphagia, unspecified: Secondary | ICD-10-CM | POA: Diagnosis not present

## 2022-04-25 DIAGNOSIS — F32A Depression, unspecified: Secondary | ICD-10-CM | POA: Diagnosis not present

## 2022-04-25 DIAGNOSIS — K529 Noninfective gastroenteritis and colitis, unspecified: Secondary | ICD-10-CM | POA: Diagnosis not present

## 2022-04-25 DIAGNOSIS — J9601 Acute respiratory failure with hypoxia: Secondary | ICD-10-CM | POA: Diagnosis not present

## 2022-04-25 LAB — BASIC METABOLIC PANEL
Anion gap: 4 — ABNORMAL LOW (ref 5–15)
BUN: 17 mg/dL (ref 6–20)
CO2: 25 mmol/L (ref 22–32)
Calcium: 8 mg/dL — ABNORMAL LOW (ref 8.9–10.3)
Chloride: 107 mmol/L (ref 98–111)
Creatinine, Ser: 0.48 mg/dL (ref 0.44–1.00)
GFR, Estimated: >60 mL/min (ref >60)
Glucose, Bld: 91 mg/dL (ref 70–99)
Potassium: 3.2 mmol/L — ABNORMAL LOW (ref 3.5–5.1)
Sodium: 136 mmol/L (ref 135–145)

## 2022-04-25 LAB — CBC
HCT: 40.5 % (ref 36.0–46.0)
Hemoglobin: 12.9 g/dL (ref 12.0–15.0)
MCH: 33.4 pg (ref 26.0–34.0)
MCHC: 31.9 g/dL (ref 30.0–36.0)
MCV: 104.9 fL — ABNORMAL HIGH (ref 80.0–100.0)
Platelets: 121 10*3/uL — ABNORMAL LOW (ref 150–400)
RBC: 3.86 MIL/uL — ABNORMAL LOW (ref 3.87–5.11)
RDW: 13.2 % (ref 11.5–15.5)
WBC: 1.2 10*3/uL — CL (ref 4.0–10.5)
nRBC: 0 % (ref 0.0–0.2)

## 2022-04-25 LAB — GLUCOSE, CAPILLARY
Glucose-Capillary: 80 mg/dL (ref 70–99)
Glucose-Capillary: 90 mg/dL (ref 70–99)
Glucose-Capillary: 112 mg/dL — ABNORMAL HIGH (ref 70–99)
Glucose-Capillary: 153 mg/dL — ABNORMAL HIGH (ref 70–99)

## 2022-04-25 LAB — LEGIONELLA PNEUMOPHILA SEROGP 1 UR AG: L. pneumophila Serogp 1 Ur Ag: NEGATIVE

## 2022-04-25 LAB — HEMOGLOBIN A1C
Hgb A1c MFr Bld: 11.5 % — ABNORMAL HIGH (ref 4.8–5.6)
Mean Plasma Glucose: 283 mg/dL

## 2022-04-25 MED ORDER — FUROSEMIDE 10 MG/ML IJ SOLN
40.0000 mg | Freq: Once | INTRAMUSCULAR | Status: AC
  Administered 2022-04-25: 40 mg via INTRAVENOUS

## 2022-04-25 MED ORDER — CHLORHEXIDINE GLUCONATE 0.12 % MT SOLN
15.0000 mL | Freq: Two times a day (BID) | OROMUCOSAL | Status: DC
  Administered 2022-04-25 – 2022-05-04 (×18): 15 mL via OROMUCOSAL
  Filled 2022-04-25 (×18): qty 15

## 2022-04-25 MED ORDER — FUROSEMIDE 10 MG/ML IJ SOLN
INTRAMUSCULAR | Status: AC
  Filled 2022-04-25: qty 4

## 2022-04-25 MED ORDER — LIVING WELL WITH DIABETES BOOK: Freq: Once | Status: AC

## 2022-04-25 MED ORDER — POTASSIUM CHLORIDE 10 MEQ/100ML IV SOLN
10.0000 meq | INTRAVENOUS | Status: AC
  Administered 2022-04-25 (×3): 10 meq via INTRAVENOUS
  Filled 2022-04-25 (×2): qty 100

## 2022-04-25 MED ORDER — ORAL CARE MOUTH RINSE
15.0000 mL | Freq: Two times a day (BID) | OROMUCOSAL | Status: DC
  Administered 2022-04-25 – 2022-05-04 (×16): 15 mL via OROMUCOSAL

## 2022-04-25 NOTE — Progress Notes (Signed)
Inpatient Diabetes Program Recommendations ? ?AACE/ADA: New Consensus Statement on Inpatient Glycemic Control (2015) ? ?Target Ranges:  Prepandial:   less than 140 mg/dL ?     Peak postprandial:   less than 180 mg/dL (1-2 hours) ?     Critically ill patients:  140 - 180 mg/dL  ? ?Lab Results  ?Component Value Date  ? GLUCAP 80 04/25/2022  ? HGBA1C 11.5 (H) 04/24/2022  ? ? ?Review of Glycemic Control ? Latest Reference Range & Units 04/24/22 12:21 04/24/22 15:50 04/24/22 21:44 04/25/22 07:04  ?Glucose-Capillary 70 - 99 mg/dL 264 (H) 269 (H) ?Novolog 8 units 1401 ?Novolog 8 units 1632 232 (H) ?Novolog 2 units 80  ?(H): Data is abnormally high ? ?Diabetes history: DM2 ?Outpatient Diabetes medications: Metformin 500 mg bid ?Current orders for Inpatient glycemic control: Levemir 15 units qd, Novolog 0-15 units tid correction + hs 0-5 units ? ?Inpatient Diabetes Program Recommendations:   ?Please consider: ?-Decrease Novolog correction to 0-9 units q 4 hrs. While NPO ? ?A1c 11.5. ?Note from [facility] in [place] 08/13/20 lists diabetes meds: ?Basaglar 10 units with increase by 2 units q hs until FBG <150 ?Metformin 500 mg bid ?Victoza qd ?Januvia 100 mg qd ?Ordered Living Well with Diabetes booklet and will plan to speak with patient during hospitalization. ? ?Thank you, ?Nani Gasser Lashanda Storlie, RN, MSN, CDE  ?Diabetes Coordinator ?Inpatient Glycemic Control Team ?Team Pager [phone] (8am-5pm) ?04/25/2022 10:26 AM ? ? ? ? ?

## 2022-04-25 NOTE — Progress Notes (Signed)
Patient requesting something else for her headache, reported Imitrex nor Tylenol are effective, MD notified ?

## 2022-04-25 NOTE — Progress Notes (Signed)
SLP Cancellation Note ? ?Patient Details ?Name: Kelli [name] ?MRN: [id number] ?DOB: May 10, 1971 ? ? ?Cancelled treatment:       Reason Eval/Treat Not Completed: Patient is NPO pending barium swallow. SLP will re-attempt later today as schedule permits. ? ?Brayon Bielefeld H. Izora Ribas MA, CCC-SLP ?Speech Language Pathologist ? ? ? ?Wende Bushy ?04/25/2022, 10:44 AM ?

## 2022-04-25 NOTE — Progress Notes (Signed)
Received call from radiology & on way to get patient for barium swallow ?

## 2022-04-25 NOTE — Progress Notes (Signed)
?Progress Note ? ? ?Patient: Kelli [name] BDZ:[id number] DOB: 1971/10/02 DOA: 04/24/2022     1 ?DOS: the patient was seen and examined on 04/25/2022 ?  ?Brief hospital course: ?Kelli [name] is a 51 y.o. female with medical history significant of prior history of asthma (per patient not a big issue lately and has never been using her home inhaler), type 2 diabetes mellitus, depression, gastroesophageal reflux disease, hyperlipidemia class II obesity; who presented to the emergency department secondary to productive coughing spells, shortness of breath, general malaise and dysuria.  Patient reports symptom has been present for the last 3-4 days and worsening.  She has noticed that in the last 24 hours she having very winded with minimal activity and just feeling completely drained.  Reports productive coughing spells (yellowish/greenish mucus, no hemoptysis), expressed being exposed to her grandkids (with URI symptoms) reported experiencing chills. ?Patient denies chest pain, no nausea or vomiting, pain, no hematuria, no hematochezia, no melena, no focal weakness, headache or any other complaints. ?  ?In the ED chest x-ray demonstrated multilobar pneumonia affecting her left lung, she was leukopenic, tachypneic, tachycardic and hypoxic.  Lactic acid 4.2.  Cultures were taken, fluid resuscitation per sepsis protocol initiated and IV antibiotics started.  TRH contacted to place patient in the hospital for further evaluation and management. ?  ?Of note, COVID/influenza PCR negative. ? ?Assessment and Plan: ?* Acute respiratory failure with hypoxia ([place]) ?-In the setting of multilobar pneumonia (left Lung) ?-Patient oxygen saturation in the mid 80s on room air; requiring 4 L nasal cannula supplementation at time of admission and overnight with need of 8 L high flow nasal cannula to maintain saturation above 92%. ?-Continue to wean oxygen supplementation as tolerated.   ?-Continue current antibiotics. ?-Patient  reporting difficulty swallowing for over 2 weeks and with high concern for aspiration.  Speech therapy has seen patient and at this moment recommendation for GI involvement and very likely endoscopy has been pursuit. ?-Continue current antibiotics ?-Continue treatment with bronchodilators and mucolytic management ?-Follow clinical response. ? ?Chronic diarrhea ?-In the setting of IBS ?-Continue the use of Questran and Florastor. ?-Appears to be stable overall. ? ?Dysphagia ?- Per patient ongoing for over 2 weeks ?-Affecting liquids and solids in different locations. ?-After abnormal esophagogram study GI service has been consulted for endoscopic evaluation and further management. ?-Dysphagia 1 diet with thin liquids and close supervision has been recommended by speech therapy pending GI evaluation. ? ?Severe sepsis ([place]) ?-Meeting severe sepsis criteria at time of admission with leukopenia, fever, tachycardia, presence of lactic acidosis (lactic acid 4.2), increased respiratory rate and the presence of hypoxia as part of organ dysfunction. ?-Lactic acid had come down to 2.6; will repeat level again in AM. ?-Patient ended requiring up to 8 L high flow nasal cannula supplementation; but is now down again to 2-4.  Continue to wean off as tolerated ?-Continue treatment of her ammonia with current antibiotics. ?-Continue to maintain adequate hydration. ? ? ?Depression ?-No suicidal ideation or hallucination ?-Continue the use of Lexapro and Wellbutrin. ? ?Hyperlipidemia ?-Continue statins. ? ?Uncontrolled type 2 diabetes mellitus with hyperglycemia, without long-term current use of insulin ([place]) ?-Patient with hyperglycemia ?-A1c 11.5 ?-Continue sliding scale insulin and Levemir; follow CBGs and adjust hypoglycemia regimen as needed. ?-Holding oral hypoglycemic agents while inpatient. ?-Modified carbohydrate diet ordered. ? ? ? ?Subjective: . ?Overnight experiencing worsening breathing with requirement of 8 L high flow  nasal cannula supplementation.  Reports intermittent coughing spells and short winded sensation with minimal  activity. ? ?Physical Exam: ?Vitals:  ? 04/25/22 1414 04/25/22 1500 04/25/22 1605 04/25/22 1622  ?BP:      ?Pulse: (!) 110 (!) 112 (!) 104 (!) 109  ?Resp: (!) 27 (!) 23 (!) 32 (!) 27  ?Temp:   (!) 97.5 ?F (36.4 ?C)   ?TempSrc:   Oral   ?SpO2: 96% 93% 96% 94%  ?Weight:      ?Height:      ? ?General exam: Alert, awake, oriented x 3; reports feeling anxious, still short winded with activity and overnight in the requiring up to 8 L high flow nasal cannula supplementation.  No fever. ?Respiratory system: Positive rhonchi bilaterally (left more than right); no wheezing at this moment.  Positive tachypnea especially with exertion. ?Cardiovascular system: Sinus tachycardia, no rubs, no gallops, no JVD. ?Gastrointestinal system: Abdomen is nondistended, soft and nontender. No organomegaly or masses felt. Normal bowel sounds heard. ?Central nervous system: Alert and oriented. No focal neurological deficits. ?Extremities: No cyanosis or clubbing; trace edema appreciated bilaterally. ?Skin: No petechiae. ?Psychiatry: Judgement and insight appear normal. Mood & affect appropriate.  ? ?Data Reviewed: ?Basic metabolic panel demonstrating sodium 136, potassium 3.2, BUN 17, creatinine 0.48 ?CBC demonstrating WBCs 1.2, hemoglobin 12.9 and platelet count 121 K. ?Lactic acid 2.6 ? ?Family Communication: No family at bedside. ? 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 ? ? Planned Discharge Destination: Home ? ? ?Author: ?Barton Dubois, MD ?04/25/2022 6:07 PM ? ?For on call review [email or web address].  ?

## 2022-04-25 NOTE — Progress Notes (Signed)
Speech Language Pathology Treatment: Dysphagia  ?Patient Details ?Name: Kelli [name] ?MRN: [id number] ?DOB: 1971/05/26 ?Today's Date: 04/25/2022 ?Time: 4401-0272 ?SLP Time Calculation (min) (ACUTE ONLY): 18 min ? ?Assessment / Plan / Recommendation ?Clinical Impression ? Pt seen for ongoing dysphagia intervention following BSE completed yesterday and esophagram completed this AM which showed a narrowing at the GE junction and pill did not pass. Pt was sitting up in her chair with family present. Pt consumed thin liquids via straw sips without incident, no signs of reduced airway protection. Pt expresses feeling very hungry and SLP messaged MD to see if Pt could have puree and thins until seen by GI and Dr. Dyann Kief agreed with recommendation. SLP provided education to Pt regarding continued need to take small bites/sips and sit fully upright for all eating and drinking and remain upright after (also elevate HOB while sleeping) due to possible regurgitation and aspiration. MD will consult GI and SLP will follow based on their recommendations. ? ?  ?HPI HPI: Kelli [name] is a 51 y.o. female with medical history significant of prior history of asthma (per patient not a big issue lately and has never been using her home inhaler), type 2 diabetes mellitus, depression, gastroesophageal reflux disease, hyperlipidemia class II obesity; who presented to the emergency department secondary to productive coughing spells, shortness of breath, general malaise and dysuria.  Patient reports symptom has been present for the last 3-4 days and worsening.  She has noticed that in the last 24 hours she having very winded with minimal activity and just feeling completely drained.  Reports productive coughing spells (yellowish/greenish mucus, no hemoptysis), expressed being exposed to her grandkids (with URI symptoms) reported experiencing chills. In the ED chest x-ray demonstrated multilobar pneumonia affecting her left lung, she was  leukopenic, tachypneic, tachycardic and hypoxic.  Lactic acid 4.2.  Cultures were taken, fluid resuscitation per sepsis protocol initiated and IV antibiotics started.  TRH contacted to place patient in the hospital for further evaluation and management. BSE requested due to Pt with PNA and regurgitating after meals. ?  ?   ?SLP Plan ? Continue with current plan of care ? ?  ?  ?Recommendations for follow up therapy are one component of a multi-disciplinary discharge planning process, led by the attending physician.  Recommendations may be updated based on patient status, additional functional criteria and insurance authorization. ?  ? ?Recommendations  ?Diet recommendations: Dysphagia 1 (puree);Thin liquid ?Liquids provided via: Cup;Straw ?Medication Administration: Crushed with puree ?Supervision: Patient able to self feed ?Compensations: Slow rate;Small sips/bites ?Postural Changes and/or Swallow Maneuvers: Seated upright 90 degrees;Upright 30-60 min after meal  ?   ?    ?   ? ? ? ? Oral Care Recommendations: Oral care BID;Patient independent with oral care ?Follow Up Recommendations: No SLP follow up ?Assistance recommended at discharge: None ?SLP Visit Diagnosis: Dysphagia, unspecified (R13.10) ?Plan: Continue with current plan of care ? ? ? ? ?  ?  ?Thank you, ? ?Genene Churn, [place] ?[phone] ? ? ?Kelli [name] ? ?04/25/2022, 2:28 PM ?

## 2022-04-25 NOTE — Assessment & Plan Note (Addendum)
-   Per patient ongoing for over 2 weeks -Affecting liquids and solids in different locations. 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 -EGD planned for 05/03/22 -Dysphagia 1 diet with thin liquids and close supervision has been recommended by speech therapy

## 2022-04-25 NOTE — Plan of Care (Signed)

## 2022-04-25 NOTE — Progress Notes (Signed)
0115:  Pt back from toilet at this time with O2 sat dropping to 84%.  Increased up O2 from 6L N/C to 8L HFNC.  Pt stating that she is short of breath and having difficulty catching her breath.   ?0120:  Notified Dr. Sidney Ace regarding pt SOB and crackles noted in bilateral lungs.  He ordered to stop IV fluids and given lasix '40mg'$  X1.  ?0130:  Lasix given and placed purewick on pt so she doesn't have to get up and exert herself to the toilet.  Respiratory notified of the increase of O2 as well. Will continue to monitor pt closely.  ?

## 2022-04-25 NOTE — Progress Notes (Signed)
Patient continues to request a different medication for her headaches, stated she is also anxious & needs something for her anxiety, MD notified  ?

## 2022-04-26 ENCOUNTER — Inpatient Hospital Stay (HOSPITAL_COMMUNITY): Payer: PRIVATE HEALTH INSURANCE

## 2022-04-26 DIAGNOSIS — J181 Lobar pneumonia, unspecified organism: Secondary | ICD-10-CM

## 2022-04-26 DIAGNOSIS — E876 Hypokalemia: Secondary | ICD-10-CM | POA: Diagnosis not present

## 2022-04-26 DIAGNOSIS — J9601 Acute respiratory failure with hypoxia: Secondary | ICD-10-CM | POA: Diagnosis not present

## 2022-04-26 DIAGNOSIS — K529 Noninfective gastroenteritis and colitis, unspecified: Secondary | ICD-10-CM | POA: Diagnosis not present

## 2022-04-26 LAB — GLUCOSE, CAPILLARY
Glucose-Capillary: 74 mg/dL (ref 70–99)
Glucose-Capillary: 172 mg/dL — ABNORMAL HIGH (ref 70–99)
Glucose-Capillary: 83 mg/dL (ref 70–99)
Glucose-Capillary: 132 mg/dL — ABNORMAL HIGH (ref 70–99)
Glucose-Capillary: 100 mg/dL — ABNORMAL HIGH (ref 70–99)

## 2022-04-26 LAB — MAGNESIUM: Magnesium: 1.9 mg/dL (ref 1.7–2.4)

## 2022-04-26 LAB — CBC
HCT: 38.5 % (ref 36.0–46.0)
Hemoglobin: 12.2 g/dL (ref 12.0–15.0)
MCH: 33.2 pg (ref 26.0–34.0)
MCHC: 31.7 g/dL (ref 30.0–36.0)
MCV: 104.6 fL — ABNORMAL HIGH (ref 80.0–100.0)
Platelets: 124 10*3/uL — ABNORMAL LOW (ref 150–400)
RBC: 3.68 MIL/uL — ABNORMAL LOW (ref 3.87–5.11)
RDW: 13.4 % (ref 11.5–15.5)
WBC: 2.9 10*3/uL — ABNORMAL LOW (ref 4.0–10.5)
nRBC: 0 % (ref 0.0–0.2)

## 2022-04-26 LAB — BASIC METABOLIC PANEL
Anion gap: 4 — ABNORMAL LOW (ref 5–15)
BUN: 20 mg/dL (ref 6–20)
CO2: 28 mmol/L (ref 22–32)
Calcium: 8.2 mg/dL — ABNORMAL LOW (ref 8.9–10.3)
Chloride: 107 mmol/L (ref 98–111)
Creatinine, Ser: 0.49 mg/dL (ref 0.44–1.00)
GFR, Estimated: >60 mL/min (ref >60)
Glucose, Bld: 72 mg/dL (ref 70–99)
Potassium: 3.3 mmol/L — ABNORMAL LOW (ref 3.5–5.1)
Sodium: 139 mmol/L (ref 135–145)

## 2022-04-26 LAB — URINE CULTURE: Culture: >=100000 — AB

## 2022-04-26 LAB — LACTIC ACID, PLASMA: Lactic Acid, Venous: 1.6 mmol/L (ref 0.5–1.9)

## 2022-04-26 MED ORDER — SALINE SPRAY 0.65 % NA SOLN
1.0000 | NASAL | Status: DC | PRN
  Administered 2022-04-26: 1 via NASAL
  Filled 2022-04-26: qty 44

## 2022-04-26 MED ORDER — INSULIN ASPART 100 UNIT/ML IJ SOLN
0.0000 [IU] | Freq: Three times a day (TID) | INTRAMUSCULAR | Status: DC
  Administered 2022-04-26: 2 [IU] via SUBCUTANEOUS
  Administered 2022-04-27: 1 [IU] via SUBCUTANEOUS
  Administered 2022-04-27: 2 [IU] via SUBCUTANEOUS
  Administered 2022-04-28: 3 [IU] via SUBCUTANEOUS
  Administered 2022-04-28: 1 [IU] via SUBCUTANEOUS
  Administered 2022-04-28: 2 [IU] via SUBCUTANEOUS
  Administered 2022-04-29: 3 [IU] via SUBCUTANEOUS
  Administered 2022-04-29: 5 [IU] via SUBCUTANEOUS

## 2022-04-26 MED ORDER — INSULIN DETEMIR 100 UNIT/ML ~~LOC~~ SOLN
10.0000 [IU] | Freq: Every day | SUBCUTANEOUS | Status: DC
  Administered 2022-04-27 – 2022-04-29 (×3): 10 [IU] via SUBCUTANEOUS
  Filled 2022-04-26 (×4): qty 0.1

## 2022-04-26 NOTE — Assessment & Plan Note (Addendum)
Repleted mag--repleted

## 2022-04-26 NOTE — Progress Notes (Signed)
SLP Cancellation Note ? ?Patient Details ?Name: Kelli [name] ?MRN: [id number] ?DOB: [birth year] ? ? ?Cancelled treatment:       Reason Eval/Treat Not Completed: Other (comment) (Pt has not yet been evaluated by GI. Will sign off and can reconsult if further SLP needs arise. Pt doing well on puree and thin.) ? ?Thank you, ? ?Genene Churn, [place] ?[phone] ? ?Damarko Stitely ?04/26/2022, 1:13 PM ?

## 2022-04-26 NOTE — Hospital Course (Addendum)
Kalifa Cadden is a 51 y.o. female with medical history significant of prior history of asthma (per patient not a big issue lately and has never been using her home inhaler), type 2 diabetes mellitus, depression, gastroesophageal reflux disease, hyperlipidemia class II obesity; who presented to the emergency department secondary to productive coughing spells, shortness of breath, general malaise and dysuria.  Patient reports symptom has been present for the last 3-4 days and worsening.  She has noticed that in the last 24 hours she having very winded with minimal activity and just feeling completely drained.  Reports productive coughing spells (yellowish/greenish mucus, no hemoptysis), expressed being exposed to her grandkids (with URI symptoms) reported experiencing chills. Patient denies chest pain, no nausea or vomiting, pain, no hematuria, no hematochezia, no melena, no focal weakness, headache or any other complaints.   In the ED chest x-ray demonstrated multilobar pneumonia affecting her left lung, she was leukopenic, tachypneic, tachycardic and hypoxic.  Lactic acid 4.2.  Cultures were taken, fluid resuscitation per sepsis protocol initiated and IV antibiotics started.  TRH contacted to place patient in the hospital for further evaluation and management.   Of note, COVID/influenza PCR negative.   05/03/2022: Pt reports that she is only needing oxygen with ambulation to bathroom.   05/04/2022: EGD with dilation today with GI service.  Update 6:15 pm: Pt insisting on going home tonight.  I had planned to monitor overnight after dilation and likely DC tomorrow but patient insists on going home now and now willing to wait longer.  DC home.  Follow up recommendations given to patient and she verbalized understanding.

## 2022-04-26 NOTE — Progress Notes (Signed)
Inpatient Diabetes Program Recommendations ? ?AACE/ADA: New Consensus Statement on Inpatient Glycemic Control (2015) ? ?Target Ranges:  Prepandial:   less than 140 mg/dL ?     Peak postprandial:   less than 180 mg/dL (1-2 hours) ?     Critically ill patients:  140 - 180 mg/dL  ? ?Lab Results  ?Component Value Date  ? GLUCAP 74 04/26/2022  ? HGBA1C 11.5 (H) 04/24/2022  ? ? ?Review of Glycemic Control ? Latest Reference Range & Units 04/25/22 07:04 04/25/22 11:51 04/25/22 16:14 04/25/22 20:06 04/26/22 07:54  ?Glucose-Capillary 70 - 99 mg/dL 80 90 112 (H) 153 (H) 74  ?(H): Data is abnormally high ? ?Diabetes history: DM2 ?Outpatient Diabetes medications: Metformin 500 mg bid ?Current orders for Inpatient glycemic control: Levemir 15 units qd, Novolog 0-15 units tid correction + hs 0-5 units ? ?Inpatient Diabetes Program Recommendations:   ?Fasting CBG 74. ?Please consider: ?-Decrease Levemir to 12 units qd ?-Decrease Novolog correction to 0-9 units tid, 0-5 units hs ?Secure chat sent to Dr. Carles Collet. ? ?Thank you, ?Nani Gasser Nysa Sarin, RN, MSN, CDE  ?Diabetes Coordinator ?Inpatient Glycemic Control Team ?Team Pager [phone] (8am-5pm) ?04/26/2022 10:25 AM ? ? ? ? ?

## 2022-04-26 NOTE — Assessment & Plan Note (Addendum)
Personally reviewed CXR--left lung opacities 5/18 CXR--personally reviewed--increase vascular congestion, increase R-opacities Check PCT--4.24>>2.07>>1.31 Finished 5 days ceftriaxone/azithro BNP--1360>>113

## 2022-04-26 NOTE — Progress Notes (Signed)
?  ?       ?PROGRESS NOTE ? ?Kelli [name] GDJ:[id number] DOB: 01/26/1971 DOA: 04/24/2022 ?PCP: [place] Nation, MD ? ?Brief History:  ?Kelli [name] is a 51 y.o. female with medical history significant of prior history of asthma (per patient not a big issue lately and has never been using her home inhaler), type 2 diabetes mellitus, depression, gastroesophageal reflux disease, hyperlipidemia class II obesity; who presented to the emergency department secondary to productive coughing spells, shortness of breath, general malaise and dysuria.  Patient reports symptom has been present for the last 3-4 days and worsening.  She has noticed that in the last 24 hours she having very winded with minimal activity and just feeling completely drained.  Reports productive coughing spells (yellowish/greenish mucus, no hemoptysis), expressed being exposed to her grandkids (with URI symptoms) reported experiencing chills. ?Patient denies chest pain, no nausea or vomiting, pain, no hematuria, no hematochezia, no melena, no focal weakness, headache or any other complaints. ?  ?In the ED chest x-ray demonstrated multilobar pneumonia affecting her left lung, she was leukopenic, tachypneic, tachycardic and hypoxic.  Lactic acid 4.2.  Cultures were taken, fluid resuscitation per sepsis protocol initiated and IV antibiotics started.  TRH contacted to place patient in the hospital for further evaluation and management. ?  ?Of note, COVID/influenza PCR negative. ?  ? ? ? ?Assessment and Plan: ?* Acute respiratory failure with hypoxia ([facility]) ?-In the setting of multilobar pneumonia (left Lung) ?-Patient oxygen saturation in the mid 80s on room air; ?-need of 8 L high flow nasal cannula to maintain saturation above 92%. ?-Continue to wean oxygen supplementation as tolerated.   ?-Continue current antibiotics. ?-Patient reporting difficulty swallowing for over 2 weeks and with high concern for aspiration.   ?Speech therapy has seen  patient and at this moment recommendation for GI involvement and very likely endoscopy has been pursuit. ?-Continue current antibiotics ?-Continue treatment with bronchodilators and mucolytic management ? ?Chronic diarrhea ?-In the setting of IBS ?-Continue the use of Questran and Florastor. ?-Appears to be stable overall. ? ?Hypokalemia ?Replete ?Check mag ? ?Lobar pneumonia ([place]) ?Personally reviewed CXR--left lung opacities ?Repeat CXR in am ?Check PCT ?Continue ceftriaxone/azithro ? ?Dysphagia ?- Per patient ongoing for over 2 weeks ?-Affecting liquids and solids in different locations. ?-After abnormal esophagogram study GI service has been consulted for endoscopic evaluation and further management. ?-Dysphagia 1 diet with thin liquids and close supervision has been recommended by speech therapy pending GI evaluation. ? ?Severe sepsis ([place]) ?-Meeting severe sepsis criteria at time of admission with leukopenia, fever, tachycardia, presence of lactic acidosis (lactic acid 4.2), increased respiratory rate and the presence of hypoxia as part of organ dysfunction. ?-Lactic acid peaked 4.2 ?-Patient ended requiring up to 8 L high flow nasal cannula supplementation ?-Continue current antibiotics. ? ? ?Depression ?-No suicidal ideation or hallucination ?-Continue the use of Lexapro and Wellbutrin. ? ?Hyperlipidemia ?-Continue statins. ? ?Uncontrolled type 2 diabetes mellitus with hyperglycemia, without long-term current use of insulin ([place]) ?-Patient with hyperglycemia ?-04/24/22 A1c 11.5 ?-Continue sliding scale insulin and Levemir; follow CBGs and adjust hypoglycemia regimen as needed. ?-Holding oral hypoglycemic agents while inpatient. ?-Modified carbohydrate diet ordered. ? ? ? ? ? ? ? ?Family Communication:  no Family at bedside ? ?Consultants:  GI ? ?Code Status:  FULL ? ?DVT Prophylaxis:  [place] Heparin  ? ? ?Procedures: ?As Listed in Progress Note Above ? ?Antibiotics: ?Ceftriaxone 5/15>> ?Azithro  5/15>> ? ? ? ? ?Subjective: ?Patient complains of sob,  worse with exertion.  Denies f/c, cp, n/v/d, abd pain ? ?Objective: ?Vitals:  ? 04/26/22 1200 04/26/22 1400 04/26/22 1600 04/26/22 1640  ?BP: 132/72 128/65 (!) 145/76   ?Pulse: 91 97 93   ?Resp: (!) 24 (!) 24    ?Temp:    97.8 ?F (36.6 ?C)  ?TempSrc:    Oral  ?SpO2: 99% 97% 94%   ?Weight:      ?Height:      ? ? ?Intake/Output Summary (Last 24 hours) at 04/26/2022 1649 ?Last data filed at 04/26/2022 1300 ?Gross per 24 hour  ?Intake 709.8 ml  ?Output 1450 ml  ?Net -740.2 ml  ? ?Weight change:  ?Exam: ? ?General:  Pt is alert, follows commands appropriately, not in acute distress ?HEENT: No icterus, No thrush, No neck mass, [place]/AT ?Cardiovascular: RRR, S1/S2, no rubs, no gallops ?Respiratory: bilateral rales, L>R ?Abdomen: Soft/+BS, non tender, non distended, no guarding ?Extremities: 1 +LE edema, No lymphangitis, No petechiae, No rashes, no synovitis ? ? ?Data Reviewed: ?I have personally reviewed following labs and imaging studies ?Basic Metabolic Panel: ?Recent Labs  ?Lab 04/24/22 ?0735 04/24/22 ?0804 04/24/22 ?7672 04/25/22 ?0451 04/26/22 ?0424  ?NA 134* 133*  --  136 139  ?K 4.0 3.5  --  3.2* 3.3*  ?CL 100 98  --  107 107  ?CO2 24  --   --  25 28  ?GLUCOSE 384* 360*  --  91 72  ?BUN 14 12  --  17 20  ?CREATININE 0.64 0.50  --  0.48 0.49  ?CALCIUM 8.4*  --   --  8.0* 8.2*  ?MG  --   --  1.2*  --  1.9  ?PHOS  --   --  2.2*  --   --   ? ?Liver Function Tests: ?Recent Labs  ?Lab 04/24/22 ?0947  ?AST 81*  ?ALT 45*  ?ALKPHOS 131*  ?BILITOT 1.0  ?PROT 6.1*  ?ALBUMIN 3.0*  ? ?No results for input(s): LIPASE, AMYLASE in the last 168 hours. ?No results for input(s): AMMONIA in the last 168 hours. ?Coagulation Profile: ?Recent Labs  ?Lab 04/24/22 ?0962  ?INR 1.3*  ? ?CBC: ?Recent Labs  ?Lab 04/24/22 ?0735 04/24/22 ?0804 04/25/22 ?0451 04/26/22 ?0424  ?WBC 1.8*  --  1.2* 2.9*  ?NEUTROABS 1.1*  --   --   --   ?HGB 14.0 14.3 12.9 12.2  ?HCT 42.4 42.0 40.5 38.5  ?MCV 102.7*  --   104.9* 104.6*  ?PLT 143*  --  121* 124*  ? ?Cardiac Enzymes: ?No results for input(s): CKTOTAL, CKMB, CKMBINDEX, TROPONINI in the last 168 hours. ?BNP: ?Invalid input(s): POCBNP ?CBG: ?Recent Labs  ?Lab 04/25/22 ?1614 04/25/22 ?2006 04/26/22 ?0754 04/26/22 ?1145 04/26/22 ?8366  ?GLUCAP 112* 153* 74 172* 83  ? ?HbA1C: ?Recent Labs  ?  04/24/22 ?0735  ?HGBA1C 11.5*  ? ?Urine analysis: ?   ?Component Value Date/Time  ? COLORURINE AMBER (A) 04/24/2022 0715  ? APPEARANCEUR CLEAR 04/24/2022 0715  ? LABSPEC 1.024 04/24/2022 0715  ? PHURINE 6.0 04/24/2022 0715  ? GLUCOSEU >=500 (A) 04/24/2022 0715  ? [place] NEGATIVE 04/24/2022 0715  ? [place] NEGATIVE 04/24/2022 0715  ? BILIRUBINUR n 09/11/2016 1605  ? KETONESUR 5 (A) 04/24/2022 0715  ? [place] NEGATIVE 04/24/2022 0715  ? UROBILINOGEN negative 09/11/2016 1605  ? UROBILINOGEN 0.2 12/24/2007 2234  ? NITRITE POSITIVE (A) 04/24/2022 0715  ? LEUKOCYTESUR NEGATIVE 04/24/2022 0715  ? ?Sepsis Labs: ?'@LABRCNTIP'$ (procalcitonin:4,lacticidven:4) ?) ?Recent Results (from the past 240 hour(s))  ?Resp Panel  by RT-PCR (Flu A&B, Covid) Nasopharyngeal Swab     Status: None  ? Collection Time: 04/24/22  7:25 AM  ? Specimen: Nasopharyngeal Swab; Nasopharyngeal(NP) swabs in vial transport medium  ?Result Value Ref Range Status  ? SARS Coronavirus 2 by RT PCR NEGATIVE NEGATIVE Final  ?  Comment: (NOTE) ?SARS-CoV-2 target nucleic acids are NOT DETECTED. ? ?The SARS-CoV-2 RNA is generally detectable in upper respiratory ?specimens during the acute phase of infection. The lowest ?concentration of SARS-CoV-2 viral copies this assay can detect is ?138 copies/mL. A negative result does not preclude SARS-Cov-2 ?infection and should not be used as the sole basis for treatment or ?other patient management decisions. A negative result may occur with  ?improper specimen collection/handling, submission of specimen other ?than nasopharyngeal swab, presence of viral mutation(s) within the ?areas targeted  by this assay, and inadequate number of viral ?copies(<138 copies/mL). A negative result must be combined with ?clinical observations, patient history, and epidemiological ?information. The expected result is Negative. ? ?

## 2022-04-26 NOTE — Plan of Care (Signed)

## 2022-04-26 NOTE — Consult Note (Signed)
? ?Gastroenterology Consult  ? ?Referring Provider: No ref. provider found ?Primary Care Physician:  Wide Ruins Nation, MD ?Primary Gastroenterologist:  Dr. Ardis Hughs ([facility]) ? ?Patient ID: Kelli [name]; [id number]; Sep 22, 1971  ? ?Admit date: 04/24/2022 ? LOS: 2 days  ? ?Date of Consultation: 04/26/2022 ? ?Reason for Consultation:  Dysphagia and GE stricture ? ?History of Present Illness  ? ?Kelli [name] is a 51 y.o. year old female with history of anemia, asthma, depression, diabetes, diverticulosis, anxiety, GERD, and HLD who presented to the ED on 5/15 with productive cough, shortness of breath, malaise, and dysuria worsening over the prior 3 to 4 days.  She was found to be septic, blood cultures were obtained, she was tachypneic tachycardic and hypoxic on admission.  Sepsis protocol started on the patient. ? ?ED course: ?Chest x-ray with multifocal left-sided pneumonia ?Labs -WBC 1.8, platelets 143, hemoglobin 14, elevated blood glucose of 384, sodium 134, albumin 3, AST 81, ALT 45, alk phos 131, BUN 14, BNP 209, hemoglobin A1c 11.5, elevated lactic acid of 3.6 ?Urinalysis with positive nitrates, glucose, ketones, rare bacteria ?She was placed on supplemental oxygen, nebulizers, and antibiotics ? ?Urine culture positive for E. Coli ? ?Consult: ?Has never had EGD.  Last colonoscopy December 2022 with entire examined colon normal, biopsies taken to evaluate for microscopic colitis which were normal. ? ?Patient was seen and evaluated by speech therapy on 5/15 due to pneumonia and reported regurgitating after meals.  She reported speech therapy that she has been regurgitating food/liquids and reported globus sensation for the past several months.  No aspiration was observed with thin liquids, pur?e, regular textures.  The patient had reported consuming a sandwich earlier that day and upon drinking fluids she started to cough and vomited up her food desaturated.  She was recommended to have a barium pill  esophagram as it was suspected that she has primary esophageal dysphagia).  She was recommended to have a dysphagia 1 diet with pur?ed foods and full thin liquids. ? ? 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 ? ? ?Vitals reviewed -she has been afebrile for the last 24 hours, however she has continued to have some mild tachycardia and tachypnea.  Blood pressures have improved from systolic 44Y to systolics in the 185'U-314H.  Oxygen saturations remained 93 to 99% on 4L nasal cannula ? ?Patient:  ?Patient reports that she has had ongoing dysphagia for the last few months.  She reports she initially thought it was acid reflux.  She has never had reflux before and just attributed her symptoms to that.  She reports mainly trouble swallowing solids and liquids.  Has not had any issues taking her medications.  She denied any frequent nausea or vomiting when eating prior to her having a productive cough and shortness of breath.  She stated that last week her grandkids were sick with upper respiratory symptoms and she was exposed to them.  She denies any melena or hematochezia.  Also denies any abdominal pain.  She states that today she feels worse than she did yesterday and that she is on a little bit more oxygen.  She stated that she went to have a bowel  movement earlier this morning and was having trouble catching her breath afterward as it took a lot out of her.  She reports that she is okay with her pur?ed diet and she is actually afraid to overdo it for fear of choking.  She states that she has been doing with the recommended as far as eating small  bites, alternating with sips of liquids and sitting up close to 90 degrees when eating.  She denies any chest pain. ? ? ?Patient reports chronic diarrhea/IBS at home.  She reports that she was previously taking Questran as prescribed by Dr. Ardis Hughs however she does not liking the way it made her feel when she had not recently been taking it.  She reports she typically has about 6 loose bowel movements a day at home.  She reports that her bowel movements are typically small in volume and does have relief of pain with defecation. ? ?Past Medical History:  ?Diagnosis Date  ? Allergy   ? Anemia   ? 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 Chronic headache   ? Colon polyps 03/28/11  ? Depression   ? Diabetes mellitus   ? Diverticulosis   ? GAD (generalized anxiety disorder)   ? Gallstones   ? GERD (gastroesophageal reflux disease)   ? Heart murmur   ? Hyperlipidemia   ? Migraines   ? Obesity   ? ? ?Past Surgical History:  ?Procedure Laterality Date  ? [place]  ? CHOLECYSTECTOMY  1992  ? COLONOSCOPY    ? POLYPECTOMY    ? RHINOPLASTY  1993  ? TUBAL LIGATION Bilateral 1995  ? ? ?Prior to Admission medications   ?Medication Sig Start Date End Date Taking? Authorizing Provider  ?acetaminophen (TYLENOL) 325 MG tablet Take 650 mg by mouth as needed.   Yes [provider]  ?albuterol (VENTOLIN HFA) 108 (90 Base) MCG/ACT inhaler Inhale 1 puff into the lungs as needed. 01/30/20  Yes [provider]  ?buPROPion (WELLBUTRIN XL) 150 MG 24 hr tablet Take 150 mg by mouth daily. 04/06/22  Yes [provider]  ?escitalopram (LEXAPRO) 20 MG tablet Take 20 mg by mouth daily. 03/23/22  Yes [provider]  ?hydrOXYzine (ATARAX) 10 MG tablet Take 10-20 mg by mouth at bedtime as needed for anxiety (slleping). 04/06/22  Yes [provider]  ?metFORMIN (GLUCOPHAGE-XR) 500 MG 24 hr tablet Take 500 mg by mouth 2 (two) times daily. 09/29/21  Yes [provider]  ?cholestyramine (QUESTRAN) 4 g  packet Take 1 packet (4 g total) by mouth daily. ?Patient not taking: Reported on 04/25/2022 10/20/21   Zehr, Janett Billow D, PA-C  ?hyoscyamine (LEVSIN) 0.125 MG tablet Take 1 tablet (0.125 mg total) by mouth every 6 (six) hours as needed. ?Patient not taking: Reported on 04/25/2022 10/20/21   Loralie Champagne, PA-C  ? ? ?Current Facility-Administered Medications  ?Medication Dose Route Frequency Provider Last Rate Last Admin  ? acetaminophen (TYLENOL) tablet 650 mg  650 mg Oral Q6H PRN Barton Dubois, MD   650 mg at 04/26/22 7253  ? Or  ? acetaminophen (TYLENOL) suppository 650 mg  650 mg Rectal Q6H PRN Barton Dubois, MD      ? azithromycin (ZITHROMAX) 500 mg in sodium chloride 0.9 % 250 mL IVPB  500 mg Intravenous Q24H Milton Ferguson, MD   Stopped at 04/26/22 [phone]  ? budesonide (PULMICORT) nebulizer solution 0.5 mg  0.5 mg Nebulization BID Barton Dubois, MD   0.5 mg at 04/26/22  3094  ? buPROPion (WELLBUTRIN XL) 24 hr tablet 150 mg  150 mg Oral Daily Barton Dubois, MD   150 mg at 04/26/22 0935  ? cefTRIAXone (ROCEPHIN) 2 g in sodium chloride 0.9 % 100 mL IVPB  2 g Intravenous Q24H Milton Ferguson, MD 200 mL/hr at 04/26/22 0955 Infusion Verify at 04/26/22 0955  ? chlorhexidine (PERIDEX) 0.12 % solution 15 mL  15 mL Mouth Rinse BID Barton Dubois, MD   15 mL at 04/26/22 0935  ? Chlorhexidine Gluconate Cloth 2 % PADS 6 each  6 each Topical Daily Barton Dubois, MD   6 each at 04/26/22 [phone]  ? cholestyramine light (PREVALITE) packet 4 g  4 g Oral Daily Barton Dubois, MD      ? dextromethorphan-guaiFENesin [facility] DM) 30-600 MG per 12 hr tablet 1 tablet  1 tablet Oral BID Barton Dubois, MD   1 tablet at 04/26/22 0881  ? escitalopram (LEXAPRO) tablet 10 mg  10 mg Oral Daily Barton Dubois, MD   10 mg at 04/26/22 0936  ? heparin injection 5,000 Units  5,000 Units Subcutaneous Q8H Barton Dubois, MD   5,000 Units at 04/26/22 0557  ? insulin aspart (novoLOG) injection 0-9 Units  0-9 Units Subcutaneous TID WC Tat, Shanon Brow, MD       ? Derrill Memo ON 04/27/2022] insulin detemir (LEVEMIR) injection 10 Units  10 Units Subcutaneous Daily Tat, David, MD      ? ipratropium (ATROVENT) nebulizer solution 0.5 mg  0.5 mg Nebulization Q6H Barton Dubois,

## 2022-04-27 ENCOUNTER — Inpatient Hospital Stay (HOSPITAL_COMMUNITY): Payer: PRIVATE HEALTH INSURANCE

## 2022-04-27 DIAGNOSIS — I5021 Acute systolic (congestive) heart failure: Secondary | ICD-10-CM | POA: Diagnosis not present

## 2022-04-27 DIAGNOSIS — J181 Lobar pneumonia, unspecified organism: Secondary | ICD-10-CM | POA: Diagnosis not present

## 2022-04-27 DIAGNOSIS — I5031 Acute diastolic (congestive) heart failure: Secondary | ICD-10-CM

## 2022-04-27 DIAGNOSIS — R1319 Other dysphagia: Secondary | ICD-10-CM

## 2022-04-27 DIAGNOSIS — J9601 Acute respiratory failure with hypoxia: Secondary | ICD-10-CM | POA: Diagnosis not present

## 2022-04-27 LAB — ECHOCARDIOGRAM COMPLETE
AR max vel: 2.27 cm2
AV Area VTI: 2.16 cm2
AV Area mean vel: 1.99 cm2
AV Mean grad: 4 mmHg
AV Peak grad: 6.9 mmHg
Ao pk vel: 1.31 m/s
Area-P 1/2: 4.17 cm2
Calc EF: 47.9 %
Height: 64 in
MV VTI: 2.46 cm2
S' Lateral: 3.65 cm
Single Plane A2C EF: 48.5 %
Single Plane A4C EF: 47.2 %
Weight: 3714.31 oz

## 2022-04-27 LAB — BRAIN NATRIURETIC PEPTIDE: B Natriuretic Peptide: 1360 pg/mL — ABNORMAL HIGH (ref 0.0–100.0)

## 2022-04-27 LAB — CBC
HCT: 38.3 % (ref 36.0–46.0)
Hemoglobin: 12.5 g/dL (ref 12.0–15.0)
MCH: 33.8 pg (ref 26.0–34.0)
MCHC: 32.6 g/dL (ref 30.0–36.0)
MCV: 103.5 fL — ABNORMAL HIGH (ref 80.0–100.0)
Platelets: 107 10*3/uL — ABNORMAL LOW (ref 150–400)
RBC: 3.7 MIL/uL — ABNORMAL LOW (ref 3.87–5.11)
RDW: 13.5 % (ref 11.5–15.5)
WBC: 3.4 10*3/uL — ABNORMAL LOW (ref 4.0–10.5)
nRBC: 0 % (ref 0.0–0.2)

## 2022-04-27 LAB — GLUCOSE, CAPILLARY
Glucose-Capillary: 82 mg/dL (ref 70–99)
Glucose-Capillary: 147 mg/dL — ABNORMAL HIGH (ref 70–99)
Glucose-Capillary: 172 mg/dL — ABNORMAL HIGH (ref 70–99)
Glucose-Capillary: 199 mg/dL — ABNORMAL HIGH (ref 70–99)

## 2022-04-27 LAB — BASIC METABOLIC PANEL
Anion gap: 9 (ref 5–15)
BUN: 18 mg/dL (ref 6–20)
CO2: 27 mmol/L (ref 22–32)
Calcium: 8.3 mg/dL — ABNORMAL LOW (ref 8.9–10.3)
Chloride: 104 mmol/L (ref 98–111)
Creatinine, Ser: 0.42 mg/dL — ABNORMAL LOW (ref 0.44–1.00)
GFR, Estimated: >60 mL/min (ref >60)
Glucose, Bld: 84 mg/dL (ref 70–99)
Potassium: 3.5 mmol/L (ref 3.5–5.1)
Sodium: 140 mmol/L (ref 135–145)

## 2022-04-27 LAB — PROCALCITONIN: Procalcitonin: 4.24 ng/mL

## 2022-04-27 MED ORDER — FUROSEMIDE 10 MG/ML IJ SOLN
40.0000 mg | Freq: Two times a day (BID) | INTRAMUSCULAR | Status: DC
  Administered 2022-04-27 – 2022-05-02 (×11): 40 mg via INTRAVENOUS
  Filled 2022-04-27 (×11): qty 4

## 2022-04-27 MED ORDER — POTASSIUM CHLORIDE CRYS ER 20 MEQ PO TBCR
20.0000 meq | EXTENDED_RELEASE_TABLET | Freq: Once | ORAL | Status: AC
Start: 2022-04-27 — End: 2022-04-27
  Administered 2022-04-27: 20 meq via ORAL
  Filled 2022-04-27: qty 1

## 2022-04-27 NOTE — Progress Notes (Signed)
PROGRESS NOTE  Kelli [name] HWE:[id number] DOB: 06/16/1971 DOA: 04/24/2022 PCP: [place] Nation, MD  Brief History:  Kelli [name] is a 51 y.o. female with medical history significant of prior history of asthma (per patient not a big issue lately and has never been using her home inhaler), type 2 diabetes mellitus, depression, gastroesophageal reflux disease, hyperlipidemia class II obesity; who presented to the emergency department secondary to productive coughing spells, shortness of breath, general malaise and dysuria.  Patient reports symptom has been present for the last 3-4 days and worsening.  She has noticed that in the last 24 hours she having very winded with minimal activity and just feeling completely drained.  Reports productive coughing spells (yellowish/greenish mucus, no hemoptysis), expressed being exposed to her grandkids (with URI symptoms) reported experiencing chills. Patient denies chest pain, no nausea or vomiting, pain, no hematuria, no hematochezia, no melena, no focal weakness, headache or any other complaints.   In the ED chest x-ray demonstrated multilobar pneumonia affecting her left lung, she was leukopenic, tachypneic, tachycardic and hypoxic.  Lactic acid 4.2.  Cultures were taken, fluid resuscitation per sepsis protocol initiated and IV antibiotics started.  TRH contacted to place patient in the hospital for further evaluation and management.   Of note, COVID/influenza PCR negative.      Assessment and Plan: * Acute respiratory failure with hypoxia (HCC) -In the setting of multilobar pneumonia (left Lung) -Patient oxygen saturation in the mid 80s on room air; -need of 8 L high flow nasal cannula to maintain saturation above 92%. -Continue to wean oxygen supplementation as tolerated.   -Continue current antibiotics. -Patient reporting difficulty swallowing for over 2 weeks and with high concern for aspiration.   Speech therapy has seen patient  and at this moment recommendation for GI involvement and very likely endoscopy has been pursuit. -Continue current antibiotics -Continue treatment with bronchodilators and mucolytic management  Chronic diarrhea -In the setting of IBS -Continue the use of Questran and Florastor. -Appears to be stable overall.  Acute diastolic CHF (congestive heart failure) ([place]) 5/18--personally reviewed CXR--increase vascular congestion and R-opacities Echo Start lasix 40 IV bid Accurate I/Os  Hypokalemia Replete Check mag  Lobar pneumonia ([place]) Personally reviewed CXR--left lung opacities 5/18 CXR--personally reviewed--increase vascular congestion, increase R-opacities Check PCT--4.24 Continue ceftriaxone/azithro  Dysphagia - Per patient ongoing for over 2 weeks -Affecting liquids and solids in different locations. 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 -EGD once respiratory status more stable -Dysphagia 1 diet with thin liquids and close supervision has been recommended by speech therapy pending GI evaluation.  Severe sepsis ([place]) -Meeting severe sepsis criteria at time of admission with leukopenia, fever, tachycardia, presence of lactic acidosis (lactic acid 4.2), increased respiratory rate and the presence of hypoxia as part of organ dysfunction. -Lactic acid peaked 4.2 -Patient ended requiring up to 8 L high flow nasal cannula supplementation -Continue current antibiotics.   Depression -No suicidal ideation or hallucination -Continue the use of Lexapro and Wellbutrin.  Hyperlipidemia -Continue statins.  Uncontrolled type 2 diabetes mellitus with hyperglycemia, without long-term current use of insulin ([place]) -Patient with hyperglycemia -04/24/22 A1c 11.5 -Continue sliding scale insulin and Levemir; follow CBGs and adjust hypoglycemia regimen as needed. -Holding oral hypoglycemic agents while inpatient. -Modified carbohydrate  diet ordered.          Family Communication:  no Family at bedside  Consultants:  GI  Code Status:  FULL  DVT Prophylaxis:  [place] Heparin    Procedures: As Listed in Progress Note Above  Antibiotics: Ceftriaxone 5/15>> Azithro 5/15>>    Subjective: She is breathing a little better.  Denies f/c, cp, n/v/d, abd pain.  She has a dry cough.  Objective: Vitals:   04/27/22 0200 04/27/22 0400 04/27/22 0600 04/27/22 0758  BP: (!) 141/79 (!) 144/73 133/65   Pulse: 90 87 87   Resp: (!) 30 (!) 28 (!) 28   Temp:  98.1 F (36.7 C)  97.8 F (36.6 C)  TempSrc:  Oral  Oral  SpO2: 97% 95% 96%   Weight:      Height:        Intake/Output Summary (Last 24 hours) at 04/27/2022 0805 Last data filed at 04/27/2022 0600 Gross per 24 hour  Intake 900.34 ml  Output 1150 ml  Net -249.66 ml   Weight change:  Exam:  General:  Pt is alert, follows commands appropriately, not in acute distress HEENT: No icterus, No thrush, No neck mass, [place]/AT Cardiovascular: RRR, S1/S2, no rubs, no gallops Respiratory: bilateral crackles. L>R Abdomen: Soft/+BS, non tender, non distended, no guarding Extremities: 1 +LE edema, No lymphangitis, No petechiae, No rashes, no synovitis   Data Reviewed: I have personally reviewed following labs and imaging studies Basic Metabolic Panel: Recent Labs  Lab 04/24/22 0735 04/24/22 0804 04/24/22 0921 04/25/22 0451 04/26/22 0424 04/27/22 0458  NA 134* 133*  --  136 139 140  K 4.0 3.5  --  3.2* 3.3* 3.5  CL 100 98  --  107 107 104  CO2 24  --   --  '25 28 27  '$ GLUCOSE 384* 360*  --  91 72 84  BUN 14 12  --  '17 20 18  '$ CREATININE 0.64 0.50  --  0.48 0.49 0.42*  CALCIUM 8.4*  --   --  8.0* 8.2* 8.3*  MG  --   --  1.2*  --  1.9  --   PHOS  --   --  2.2*  --   --   --    Liver Function Tests: Recent Labs  Lab 04/24/22 0735  AST 81*  ALT 45*  ALKPHOS 131*  BILITOT 1.0  PROT 6.1*  ALBUMIN 3.0*   No results for input(s): LIPASE, AMYLASE in the last  168 hours. No results for input(s): AMMONIA in the last 168 hours. Coagulation Profile: Recent Labs  Lab 04/24/22 0757  INR 1.3*   CBC: Recent Labs  Lab 04/24/22 0735 04/24/22 0804 04/25/22 0451 04/26/22 0424 04/27/22 0458  WBC 1.8*  --  1.2* 2.9* 3.4*  NEUTROABS 1.1*  --   --   --   --   HGB 14.0 14.3 12.9 12.2 12.5  HCT 42.4 42.0 40.5 38.5 38.3  MCV 102.7*  --  104.9* 104.6* 103.5*  PLT 143*  --  121* 124* 107*   Cardiac Enzymes: No results for input(s): CKTOTAL, CKMB, CKMBINDEX, TROPONINI in the last 168 hours. BNP: Invalid input(s): POCBNP CBG: Recent Labs  Lab 04/26/22 1145 04/26/22 1631 04/26/22 1804 04/26/22 2046 04/27/22 0755  GLUCAP 172* 83 132* 100* 82   HbA1C: No results for input(s): HGBA1C in the last 72 hours. Urine analysis:    Component Value Date/Time   COLORURINE AMBER (A) 04/24/2022 0715   APPEARANCEUR CLEAR 04/24/2022 0715   LABSPEC 1.024 04/24/2022 0715   PHURINE 6.0 04/24/2022 0715   GLUCOSEU >=500 (A) 04/24/2022 0715   HGBUR NEGATIVE 04/24/2022 0715   BILIRUBINUR  NEGATIVE 04/24/2022 0715   BILIRUBINUR n 09/11/2016 1605   KETONESUR 5 (A) 04/24/2022 0715   PROTEINUR NEGATIVE 04/24/2022 0715   UROBILINOGEN negative 09/11/2016 1605   UROBILINOGEN 0.2 12/24/2007 2234   NITRITE POSITIVE (A) 04/24/2022 0715   LEUKOCYTESUR NEGATIVE 04/24/2022 0715   Sepsis Labs: '@LABRCNTIP'$ (procalcitonin:4,lacticidven:4) ) Recent Results (from the past 240 hour(s))  Resp Panel by RT-PCR (Flu A&B, Covid) Nasopharyngeal Swab     Status: None   Collection Time: 04/24/22  7:25 AM   Specimen: Nasopharyngeal Swab; Nasopharyngeal(NP) swabs in vial transport medium  Result Value Ref Range Status   SARS Coronavirus 2 by RT PCR NEGATIVE NEGATIVE Final    Comment: (NOTE) SARS-CoV-2 target nucleic acids are NOT DETECTED.  The SARS-CoV-2 RNA is generally detectable in upper respiratory specimens during the acute phase of infection. The lowest concentration of  SARS-CoV-2 viral copies this assay can detect is 138 copies/mL. A negative result does not preclude SARS-Cov-2 infection and should not be used as the sole basis for treatment or other patient management decisions. A negative result may occur with  improper specimen collection/handling, submission of specimen other than nasopharyngeal swab, presence of viral mutation(s) within the areas targeted by this assay, and inadequate number of viral copies(<138 copies/mL). A negative result must be combined with clinical observations, patient history, and epidemiological information. The expected result is Negative.  Fact Sheet for Patients:  EntrepreneurPulse.com.au  Fact Sheet for Healthcare Providers:  IncredibleEmployment.be  This test is no t yet approved or cleared by the [place] FDA and  has been authorized for detection and/or diagnosis of SARS-CoV-2 by FDA under an Emergency Use Authorization (EUA). This EUA will remain  in effect (meaning this test can be used) for the duration of the COVID-19 declaration under Section 564(b)(1) of the Act, 21 U.S.C.section 360bbb-3(b)(1), unless the authorization is terminated  or revoked sooner.       Influenza A by PCR NEGATIVE NEGATIVE Final   Influenza B by PCR NEGATIVE NEGATIVE Final    Comment: (NOTE) The Xpert Xpress SARS-CoV-2/FLU/RSV plus assay is intended as an aid in the diagnosis of influenza from Nasopharyngeal swab specimens and should not be used as a sole basis for treatment. Nasal washings and aspirates are unacceptable for Xpert Xpress SARS-CoV-2/FLU/RSV testing.  Fact Sheet for Patients: EntrepreneurPulse.com.au  Fact Sheet for Healthcare Providers: IncredibleEmployment.be  This test is not yet approved or cleared by the [place] FDA and has been authorized for detection and/or diagnosis of SARS-CoV-2 by FDA under an Emergency Use  Authorization (EUA). This EUA will remain in effect (meaning this test can be used) for the duration of the COVID-19 declaration under Section 564(b)(1) of the Act, 21 U.S.C. section 360bbb-3(b)(1), unless the authorization is terminated or revoked.  Performed at [facility], [address], [place], [place] [address]   Urine Culture     Status: Abnormal   Collection Time: 04/24/22  7:33 AM   Specimen: Urine, Catheterized  Result Value Ref Range Status   Specimen Description   Final    URINE, CATHETERIZED Performed at [facility], [address], [place], [place] [address]    Special Requests   Final    NONE Performed at [facility], [address], [place], [place] [address]    Culture >=100,000 COLONIES/mL ESCHERICHIA COLI (A)  Final   Report Status 04/26/2022 FINAL  Final   Organism ID, Bacteria ESCHERICHIA COLI (A)  Final      Susceptibility   Escherichia coli - MIC*  AMPICILLIN >=32 RESISTANT Resistant     CEFAZOLIN <=4 SENSITIVE Sensitive     CEFEPIME <=0.12 SENSITIVE Sensitive     CEFTRIAXONE <=0.25 SENSITIVE Sensitive     CIPROFLOXACIN >=4 RESISTANT Resistant     GENTAMICIN <=1 SENSITIVE Sensitive     IMIPENEM <=0.25 SENSITIVE Sensitive     NITROFURANTOIN <=16 SENSITIVE Sensitive     TRIMETH/SULFA <=20 SENSITIVE Sensitive     AMPICILLIN/SULBACTAM 16 INTERMEDIATE Intermediate     PIP/TAZO <=4 SENSITIVE Sensitive     * >=100,000 COLONIES/mL ESCHERICHIA COLI  Blood Culture (routine x 2)     Status: None (Preliminary result)   Collection Time: 04/24/22  7:57 AM   Specimen: BLOOD LEFT HAND  Result Value Ref Range Status   Specimen Description BLOOD LEFT HAND  Final   Special Requests   Final    BOTTLES DRAWN AEROBIC AND ANAEROBIC Blood Culture results may not be optimal due to an inadequate volume of blood received in culture bottles   Culture   Final    NO GROWTH 2 DAYS Performed at [facility], [address] [place], [place] [address]    Report Status PENDING   Incomplete  Blood Culture (routine x 2)     Status: None (Preliminary result)   Collection Time: 04/24/22  7:57 AM   Specimen: BLOOD RIGHT WRIST  Result Value Ref Range Status   Specimen Description BLOOD RIGHT WRIST  Final   Special Requests   Final    BOTTLES DRAWN AEROBIC AND ANAEROBIC Blood Culture adequate volume   Culture   Final    NO GROWTH 2 DAYS Performed at [facility], [address] [place], [place] [address]    Report Status PENDING  Incomplete  MRSA Next Gen by PCR, Nasal     Status: None   Collection Time: 04/24/22 10:54 AM   Specimen: Urine, Clean Catch; Nasal Swab  Result Value Ref Range Status   MRSA by PCR Next Gen NOT DETECTED NOT DETECTED Final    Comment: (NOTE) The GeneXpert MRSA Assay (FDA approved for NASAL specimens only), is one component of a comprehensive MRSA colonization surveillance program. It is not intended to diagnose MRSA infection nor to guide or monitor treatment for MRSA infections. Test performance is not FDA approved in patients less than 55 years old. Performed at [facility], [address], [place], [place] [address]      Scheduled Meds:  budesonide (PULMICORT) nebulizer solution  0.5 mg Nebulization BID   buPROPion  150 mg Oral Daily   chlorhexidine  15 mL Mouth Rinse BID   Chlorhexidine Gluconate Cloth  6 each Topical Daily   cholestyramine light  4 g Oral Daily   dextromethorphan-guaiFENesin  1 tablet Oral BID   escitalopram  10 mg Oral Daily   furosemide  40 mg Intravenous BID   heparin injection (subcutaneous)  5,000 Units Subcutaneous Q8H   insulin aspart  0-9 Units Subcutaneous TID WC   insulin detemir  10 Units Subcutaneous Daily   ipratropium  0.5 mg Nebulization Q6H   levalbuterol  0.63 mg Nebulization Q6H   mouth rinse  15 mL Mouth Rinse q12n4p   pantoprazole  40 mg Oral Daily   potassium chloride  20 mEq Oral Once   rosuvastatin  5 mg Oral Daily   saccharomyces boulardii  250 mg Oral BID   Continuous  Infusions:  azithromycin Stopped (04/26/22 0858)   cefTRIAXone (ROCEPHIN)  IV Stopped (04/26/22 1004)    Procedures/Studies: DG Chest Port  1 View  Result Date: 04/24/2022 CLINICAL DATA:  Short of breath EXAM: PORTABLE CHEST 1 VIEW COMPARISON:  12/14/2020 FINDINGS: New consolidative opacities in the left lung. No pleural effusion. No pneumothorax. Heart size is normal. IMPRESSION: Multifocal left-sided pneumonia. Electronically Signed   By: Macy Mis M.D.   On: 04/24/2022 07:46   DG ESOPHAGUS W DOUBLE CM (HD)  Result Date: 04/25/2022 CLINICAL DATA:  Dysphagia, feels like foods are sticking in her chest and lower cervical region, pneumonia EXAM: ESOPHOGRAM / BARIUM SWALLOW / BARIUM TABLET STUDY TECHNIQUE: Combined double contrast and single contrast examination performed using effervescent crystals, thick barium liquid, and thin barium liquid. The patient was observed with fluoroscopy swallowing a 13 mm barium sulphate tablet. FLUOROSCOPY: Radiation Exposure Index (as provided by the fluoroscopic device): 57.0 mGy Kerma COMPARISON:  None Available. FINDINGS: Esophageal distention: Smooth narrowing at/just above gastroesophageal junction. Remainder of esophagus appears to distend normally. Filling defects:  No filling defects 12.5 mm barium tablet: Obstructed at the GE junction and did not pass into stomach despite multiple swallows of water and thick barium. 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 No residuals. 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 Electronically Signed   By: Lavonia Dana M.D.   On: 04/25/2022 10:59    Orson Eva, DO  [facility]  If 7PM-7AM, please contact night-coverage [email or web address] Password [email or web address] 04/27/2022,  8:05 AM   LOS: 3 days

## 2022-04-27 NOTE — Progress Notes (Signed)
   04/27/22 1500  ReDS Vest / Clip  Station Marker B  Ruler Value 34  ReDS Value Range (!) > 40  ReDS Actual Value 51  Anatomical Comments patient sitting in bed

## 2022-04-27 NOTE — Progress Notes (Signed)
Pt on 5lpm salter HFNC spo2 96-97% o2 decreased to 4lpm

## 2022-04-27 NOTE — Assessment & Plan Note (Signed)
5/18--personally reviewed CXR--increase vascular congestion and R-opacities Echo Start lasix 40 IV bid Accurate I/Os

## 2022-04-27 NOTE — Progress Notes (Signed)
*  PRELIMINARY RESULTS* Echocardiogram 2D Echocardiogram has been performed.  Kelli [name] 04/27/2022, 11:58 AM

## 2022-04-27 NOTE — Progress Notes (Signed)
Gastroenterology Progress Note   Referring Provider: No ref. provider found Primary Care Physician:  Towson Nation, MD Primary Gastroenterologist:  Dr. Ardis Hughs   Patient ID: Kelli [name]; [id number]; 1971-05-13    Subjective   Patient reports she has been doing okay with her diet.  Denies any abdominal pain, nausea or vomiting.  She reports that she did not have a good night for a breathing standpoint.  She has had increased shortness of breath and coughing.  She states that she was placed on more oxygen overnight.  Denies any overt diarrhea.   Objective   Vital signs in last 24 hours Temp:  [97.3 F (36.3 C)-98.1 F (36.7 C)] 97.8 F (36.6 C) (05/18 0758) Pulse Rate:  [79-106] 87 (05/18 0600) Resp:  [17-31] 28 (05/18 0600) BP: (120-175)/(61-90) 133/65 (05/18 0600) SpO2:  [94 %-99 %] 96 % (05/18 0600) Last BM Date : 04/25/22  Physical Exam General:   Alert and oriented, pleasant Head:  Normocephalic and atraumatic. Eyes:  No icterus, sclera clear. Conjuctiva pink.  Mouth:  Without lesions, mucosa pink and moist.  Neck:  Supple, without thyromegaly or masses.  Heart:  S1, S2 present, no murmurs noted.  Lungs: Clear to auscultation bilaterally, without wheezing, rales, or rhonchi.  Abdomen:  Bowel sounds present, soft, non-tender, non-distended. No HSM or hernias noted. No rebound or guarding. No masses appreciated  Msk:  Symmetrical without gross deformities. Normal posture. Pulses:  Normal pulses noted. Extremities:  Without clubbing or edema. Neurologic:  Alert and  oriented x4;  grossly normal neurologically. Skin:  Warm and dry, intact without significant lesions.  Cervical Nodes:  No significant cervical adenopathy. Psych:  Alert and cooperative. Normal mood and affect.  Intake/Output from previous day: 05/17 0701 - 05/18 0700 In: 900.3 [P.O.:520; IV Piggyback:380.3] Out: 1150 [Urine:1150] Intake/Output this shift: No intake/output data recorded.  Lab  Results  Recent Labs    04/25/22 0451 04/26/22 0424 04/27/22 0458  WBC 1.2* 2.9* 3.4*  HGB 12.9 12.2 12.5  HCT 40.5 38.5 38.3  PLT 121* 124* 107*   BMET Recent Labs    04/25/22 0451 04/26/22 0424 04/27/22 0458  NA 136 139 140  K 3.2* 3.3* 3.5  CL 107 107 104  CO2 '25 28 27  '$ GLUCOSE 91 72 84  BUN '17 20 18  '$ CREATININE 0.48 0.49 0.42*  CALCIUM 8.0* 8.2* 8.3*   LFT No results for input(s): PROT, ALBUMIN, AST, ALT, ALKPHOS, BILITOT, BILIDIR, IBILI in the last 72 hours. PT/INR No results for input(s): LABPROT, INR in the last 72 hours. Hepatitis Panel No results for input(s): HEPBSAG, HCVAB, HEPAIGM, HEPBIGM in the last 72 hours.   Studies/Results DG CHEST PORT 1 VIEW  Result Date: 04/27/2022 CLINICAL DATA:  Follow-up lobar pneumonia. EXAM: PORTABLE CHEST 1 VIEW COMPARISON:  Chest x-ray May 15, 23. FINDINGS: Increasing left greater than right airspace opacities. Airspace opacities are confluent in the left lung and patchy in the right lung. No visible pleural effusions or pneumothorax. Cardiac silhouette is largely obscured. No displaced fracture. IMPRESSION: Increasing left greater than right airspace opacities, concerning for worsening pneumonia. These results will be called to the ordering clinician or representative by the Radiologist Assistant, and communication documented in the PACS or [employer]. Electronically Signed   By: Margaretha Sheffield M.D.   On: 04/27/2022 08:07   DG Chest Port 1 View  Result Date: 04/24/2022 CLINICAL DATA:  Short of breath EXAM: PORTABLE CHEST 1 VIEW COMPARISON:  12/14/2020 FINDINGS: New consolidative  opacities in the left lung. No pleural effusion. No pneumothorax. Heart size is normal. IMPRESSION: Multifocal left-sided pneumonia. Electronically Signed   By: Macy Mis M.D.   On: 04/24/2022 07:46   DG ESOPHAGUS W DOUBLE CM (HD)  Result Date: 04/25/2022 CLINICAL DATA:  Dysphagia, feels like foods are sticking in her chest and lower  cervical region, pneumonia EXAM: ESOPHOGRAM / BARIUM SWALLOW / BARIUM TABLET STUDY TECHNIQUE: Combined double contrast and single contrast examination performed using effervescent crystals, thick barium liquid, and thin barium liquid. The patient was observed with fluoroscopy swallowing a 13 mm barium sulphate tablet. FLUOROSCOPY: Radiation Exposure Index (as provided by the fluoroscopic device): 57.0 mGy Kerma COMPARISON:  None Available. FINDINGS: Esophageal distention: Smooth narrowing at/just above gastroesophageal junction. Remainder of esophagus appears to distend normally. Filling defects:  No filling defects 12.5 mm barium tablet: Obstructed at the GE junction and did not pass into stomach despite multiple swallows of water and thick barium. 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 No residuals. 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 Electronically Signed   By: Lavonia Dana M.D.   On: 04/25/2022 10:59    Assessment  51 y.o. female with a history of anemia, depression, diabetes, diverticulosis, anxiety, GERD, HLD who presented to the ED 5/15 with productive cough, shortness of breath, malaise, and dysuria worsening over the prior 3 to 4 days.  She was admitted with chest x-ray concerning for pneumonia and started on IV antibiotics.  GI consulted for dysphagia and GE stricture noted on BPE.   Dysphagia: Patient reports issues swallowing solids and liquids over the last couple of months.  Denies pill dysphagia.  She initially thought her symptoms were related to reflux.  No other alarm symptoms present.  She been evaluated by speech with bedside swallow exam without evidence of aspiration.  Within the 3 to 4 days prior to admission she reported  regurgitation of food and frequent coughing episodes.  BP performed on 5/16 revealed esophageal distention with smooth narrowing just above the GE junction, 12.5 mm barium tablet was obstructed at the GE junction despite multiple swallows of water thick barium with no laryngeal penetration or aspiration.  She was started on pured diet (dysphagia 1) with thin liquids which she has been tolerating without issue.  She is appropriately following precautions when eating including sitting up in bed and alternating with sips of liquids.  When she is clinically stable from respiratory standpoint she will need EGD with dilation.  Patient is aware and in agreement with the plan.  She should continue PPI.  Pneumonia: Chest x-ray on admission with multifocal left-sided pneumonia.  She has leukopenia with WBC 1.8 on admission.  She was placed on supplemental oxygen with scheduled nebulizers and IV antibiotics.  Despite IV antibiotics her respiratory status worsened today compared to yesterday, she is currently on 8 L high flow nasal cannula with chest x-ray evidence of worsening left greater than right opacities.  She continues to be tachypneic and short of breath with any exertion.  She is being treated with Lasix IV and receiving Doppler studies and echocardiogram for further evaluation.  She was also placed on fluid restriction.  Chronic diarrhea/IBS-D: Patient reports he chronically has diarrhea at home with about 6 bowel movements a day that are usually loose in nature.  Bowel movements are usually small amounts and abdominal pain improves with defecation.  She currently denies any overt abdominal pain or worsening in her diarrhea.  No stool occurrences documented however patient reports she has had at least 1-2 bowel movements a day since admission.  She reports that she was prescribed cholestyramine by her outpatient GI but has not been taking it.  We will continue her Florastor and discontinue  cholestyramine.   Plan / Recommendations  He is EGD with dilation once clinically stable from respiratory standpoint, likely not until later in the weekend or Monday. Continue dysphagia 1 diet with thin liquids, maintaining upright position when consuming meals Continue PPI daily Continue supportive measures Continue home Florastor D/C cholestyramine as patient not been taking at home    LOS: 3 days    04/27/2022, 8:59 AM   Venetia Night, MSN, FNP-BC, AGACNP-BC [facility]

## 2022-04-27 NOTE — H&P (View-Only) (Signed)
Gastroenterology Progress Note   Referring Provider: No ref. provider found Primary Care Physician:  Shafer Nation, MD Primary Gastroenterologist:  Dr. Ardis Hughs   Patient ID: Kelli [name]; [id number]; March 05, 1971    Subjective   Patient reports she has been doing okay with her diet.  Denies any abdominal pain, nausea or vomiting.  She reports that she did not have a good night for a breathing standpoint.  She has had increased shortness of breath and coughing.  She states that she was placed on more oxygen overnight.  Denies any overt diarrhea.   Objective   Vital signs in last 24 hours Temp:  [97.3 F (36.3 C)-98.1 F (36.7 C)] 97.8 F (36.6 C) (05/18 0758) Pulse Rate:  [79-106] 87 (05/18 0600) Resp:  [17-31] 28 (05/18 0600) BP: (120-175)/(61-90) 133/65 (05/18 0600) SpO2:  [94 %-99 %] 96 % (05/18 0600) Last BM Date : 04/25/22  Physical Exam General:   Alert and oriented, pleasant Head:  Normocephalic and atraumatic. Eyes:  No icterus, sclera clear. Conjuctiva pink.  Mouth:  Without lesions, mucosa pink and moist.  Neck:  Supple, without thyromegaly or masses.  Heart:  S1, S2 present, no murmurs noted.  Lungs: Clear to auscultation bilaterally, without wheezing, rales, or rhonchi.  Abdomen:  Bowel sounds present, soft, non-tender, non-distended. No HSM or hernias noted. No rebound or guarding. No masses appreciated  Msk:  Symmetrical without gross deformities. Normal posture. Pulses:  Normal pulses noted. Extremities:  Without clubbing or edema. Neurologic:  Alert and  oriented x4;  grossly normal neurologically. Skin:  Warm and dry, intact without significant lesions.  Cervical Nodes:  No significant cervical adenopathy. Psych:  Alert and cooperative. Normal mood and affect.  Intake/Output from previous day: 05/17 0701 - 05/18 0700 In: 900.3 [P.O.:520; IV Piggyback:380.3] Out: 1150 [Urine:1150] Intake/Output this shift: No intake/output data recorded.  Lab  Results  Recent Labs    04/25/22 0451 04/26/22 0424 04/27/22 0458  WBC 1.2* 2.9* 3.4*  HGB 12.9 12.2 12.5  HCT 40.5 38.5 38.3  PLT 121* 124* 107*   BMET Recent Labs    04/25/22 0451 04/26/22 0424 04/27/22 0458  NA 136 139 140  K 3.2* 3.3* 3.5  CL 107 107 104  CO2 '25 28 27  '$ GLUCOSE 91 72 84  BUN '17 20 18  '$ CREATININE 0.48 0.49 0.42*  CALCIUM 8.0* 8.2* 8.3*   LFT No results for input(s): PROT, ALBUMIN, AST, ALT, ALKPHOS, BILITOT, BILIDIR, IBILI in the last 72 hours. PT/INR No results for input(s): LABPROT, INR in the last 72 hours. Hepatitis Panel No results for input(s): HEPBSAG, HCVAB, HEPAIGM, HEPBIGM in the last 72 hours.   Studies/Results DG CHEST PORT 1 VIEW  Result Date: 04/27/2022 CLINICAL DATA:  Follow-up lobar pneumonia. EXAM: PORTABLE CHEST 1 VIEW COMPARISON:  Chest x-ray May 15, 23. FINDINGS: Increasing left greater than right airspace opacities. Airspace opacities are confluent in the left lung and patchy in the right lung. No visible pleural effusions or pneumothorax. Cardiac silhouette is largely obscured. No displaced fracture. IMPRESSION: Increasing left greater than right airspace opacities, concerning for worsening pneumonia. These results will be called to the ordering clinician or representative by the Radiologist Assistant, and communication documented in the PACS or [employer]. Electronically Signed   By: Margaretha Sheffield M.D.   On: 04/27/2022 08:07   DG Chest Port 1 View  Result Date: 04/24/2022 CLINICAL DATA:  Short of breath EXAM: PORTABLE CHEST 1 VIEW COMPARISON:  12/14/2020 FINDINGS: New consolidative  opacities in the left lung. No pleural effusion. No pneumothorax. Heart size is normal. IMPRESSION: Multifocal left-sided pneumonia. Electronically Signed   By: Macy Mis M.D.   On: 04/24/2022 07:46   DG ESOPHAGUS W DOUBLE CM (HD)  Result Date: 04/25/2022 CLINICAL DATA:  Dysphagia, feels like foods are sticking in her chest and lower  cervical region, pneumonia EXAM: ESOPHOGRAM / BARIUM SWALLOW / BARIUM TABLET STUDY TECHNIQUE: Combined double contrast and single contrast examination performed using effervescent crystals, thick barium liquid, and thin barium liquid. The patient was observed with fluoroscopy swallowing a 13 mm barium sulphate tablet. FLUOROSCOPY: Radiation Exposure Index (as provided by the fluoroscopic device): 57.0 mGy Kerma COMPARISON:  None Available. FINDINGS: Esophageal distention: Smooth narrowing at/just above gastroesophageal junction. Remainder of esophagus appears to distend normally. Filling defects:  No filling defects 12.5 mm barium tablet: Obstructed at the GE junction and did not pass into stomach despite multiple swallows of water and thick barium. 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 No residuals. 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 Electronically Signed   By: Lavonia Dana M.D.   On: 04/25/2022 10:59    Assessment  51 y.o. female with a history of anemia, depression, diabetes, diverticulosis, anxiety, GERD, HLD who presented to the ED 5/15 with productive cough, shortness of breath, malaise, and dysuria worsening over the prior 3 to 4 days.  She was admitted with chest x-ray concerning for pneumonia and started on IV antibiotics.  GI consulted for dysphagia and GE stricture noted on BPE.   Dysphagia: Patient reports issues swallowing solids and liquids over the last couple of months.  Denies pill dysphagia.  She initially thought her symptoms were related to reflux.  No other alarm symptoms present.  She been evaluated by speech with bedside swallow exam without evidence of aspiration.  Within the 3 to 4 days prior to admission she reported  regurgitation of food and frequent coughing episodes.  BP performed on 5/16 revealed esophageal distention with smooth narrowing just above the GE junction, 12.5 mm barium tablet was obstructed at the GE junction despite multiple swallows of water thick barium with no laryngeal penetration or aspiration.  She was started on pured diet (dysphagia 1) with thin liquids which she has been tolerating without issue.  She is appropriately following precautions when eating including sitting up in bed and alternating with sips of liquids.  When she is clinically stable from respiratory standpoint she will need EGD with dilation.  Patient is aware and in agreement with the plan.  She should continue PPI.  Pneumonia: Chest x-ray on admission with multifocal left-sided pneumonia.  She has leukopenia with WBC 1.8 on admission.  She was placed on supplemental oxygen with scheduled nebulizers and IV antibiotics.  Despite IV antibiotics her respiratory status worsened today compared to yesterday, she is currently on 8 L high flow nasal cannula with chest x-ray evidence of worsening left greater than right opacities.  She continues to be tachypneic and short of breath with any exertion.  She is being treated with Lasix IV and receiving Doppler studies and echocardiogram for further evaluation.  She was also placed on fluid restriction.  Chronic diarrhea/IBS-D: Patient reports he chronically has diarrhea at home with about 6 bowel movements a day that are usually loose in nature.  Bowel movements are usually small amounts and abdominal pain improves with defecation.  She currently denies any overt abdominal pain or worsening in her diarrhea.  No stool occurrences documented however patient reports she has had at least 1-2 bowel movements a day since admission.  She reports that she was prescribed cholestyramine by her outpatient GI but has not been taking it.  We will continue her Florastor and discontinue  cholestyramine.   Plan / Recommendations  He is EGD with dilation once clinically stable from respiratory standpoint, likely not until later in the weekend or Monday. Continue dysphagia 1 diet with thin liquids, maintaining upright position when consuming meals Continue PPI daily Continue supportive measures Continue home Florastor D/C cholestyramine as patient not been taking at home    LOS: 3 days    04/27/2022, 8:59 AM   Venetia Night, MSN, FNP-BC, AGACNP-BC [facility]

## 2022-04-28 ENCOUNTER — Other Ambulatory Visit (HOSPITAL_COMMUNITY): Payer: Self-pay

## 2022-04-28 DIAGNOSIS — I509 Heart failure, unspecified: Secondary | ICD-10-CM | POA: Diagnosis not present

## 2022-04-28 DIAGNOSIS — R1319 Other dysphagia: Secondary | ICD-10-CM

## 2022-04-28 DIAGNOSIS — I5021 Acute systolic (congestive) heart failure: Secondary | ICD-10-CM

## 2022-04-28 DIAGNOSIS — E876 Hypokalemia: Secondary | ICD-10-CM | POA: Diagnosis not present

## 2022-04-28 DIAGNOSIS — J9601 Acute respiratory failure with hypoxia: Secondary | ICD-10-CM

## 2022-04-28 LAB — BASIC METABOLIC PANEL
Anion gap: 12 (ref 5–15)
BUN: 16 mg/dL (ref 6–20)
CO2: 29 mmol/L (ref 22–32)
Calcium: 8.1 mg/dL — ABNORMAL LOW (ref 8.9–10.3)
Chloride: 100 mmol/L (ref 98–111)
Creatinine, Ser: 0.4 mg/dL — ABNORMAL LOW (ref 0.44–1.00)
GFR, Estimated: >60 mL/min (ref >60)
Glucose, Bld: 164 mg/dL — ABNORMAL HIGH (ref 70–99)
Potassium: 3.4 mmol/L — ABNORMAL LOW (ref 3.5–5.1)
Sodium: 141 mmol/L (ref 135–145)

## 2022-04-28 LAB — PROCALCITONIN: Procalcitonin: 2.07 ng/mL

## 2022-04-28 LAB — GLUCOSE, CAPILLARY
Glucose-Capillary: 199 mg/dL — ABNORMAL HIGH (ref 70–99)
Glucose-Capillary: 238 mg/dL — ABNORMAL HIGH (ref 70–99)
Glucose-Capillary: 141 mg/dL — ABNORMAL HIGH (ref 70–99)
Glucose-Capillary: 187 mg/dL — ABNORMAL HIGH (ref 70–99)

## 2022-04-28 LAB — MAGNESIUM: Magnesium: 1.6 mg/dL — ABNORMAL LOW (ref 1.7–2.4)

## 2022-04-28 MED ORDER — POTASSIUM CHLORIDE CRYS ER 20 MEQ PO TBCR
20.0000 meq | EXTENDED_RELEASE_TABLET | Freq: Every day | ORAL | Status: DC
  Administered 2022-04-28 – 2022-04-29 (×2): 20 meq via ORAL
  Filled 2022-04-28 (×2): qty 1

## 2022-04-28 MED ORDER — MAGNESIUM SULFATE 2 GM/50ML IV SOLN
2.0000 g | Freq: Once | INTRAVENOUS | Status: AC
  Administered 2022-04-28: 2 g via INTRAVENOUS
  Filled 2022-04-28: qty 50

## 2022-04-28 MED ORDER — LIVING WELL WITH DIABETES BOOK
Freq: Once | Status: AC
Start: 2022-04-28 — End: 2022-04-28

## 2022-04-28 NOTE — Progress Notes (Signed)
PROGRESS NOTE  Kelli [name] KXF:[id number] DOB: January 07, 1971 DOA: 04/24/2022 PCP: [place], MD  Brief History:  Kelli [name] is a 51 y.o. female with medical history significant of prior history of asthma (per patient not a big issue lately and has never been using her home inhaler), type 2 diabetes mellitus, depression, gastroesophageal reflux disease, hyperlipidemia class II obesity; who presented to the emergency department secondary to productive coughing spells, shortness of breath, general malaise and dysuria.  Patient reports symptom has been present for the last 3-4 days and worsening.  She has noticed that in the last 24 hours she having very winded with minimal activity and just feeling completely drained.  Reports productive coughing spells (yellowish/greenish mucus, no hemoptysis), expressed being exposed to her grandkids (with URI symptoms) reported experiencing chills. Patient denies chest pain, no nausea or vomiting, pain, no hematuria, no hematochezia, no melena, no focal weakness, headache or any other complaints.   In the ED chest x-ray demonstrated multilobar pneumonia affecting her left lung, she was leukopenic, tachypneic, tachycardic and hypoxic.  Lactic acid 4.2.  Cultures were taken, fluid resuscitation per sepsis protocol initiated and IV antibiotics started.  TR[facility] contacted to place patient in the hospital for further evaluation and management.   Of note, COVID/influenza PCR negative.       Assessment and Plan: * Acute respiratory failure with hypoxia (HCC) -In the setting of multilobar pneumonia (left Lung) -Patient oxygen saturation in the mid 80s on room air; -need of 8 L high flow nasal cannula to maintain saturation above 92%. -now down to 5L  -Continue current antibiotics. -Patient reporting difficulty swallowing for over 2 weeks and with high concern for aspiration.   Speech therapy has seen patient and at this moment recommendation for  GI involvement -Continue current antibiotics -Continue treatment with bronchodilators and mucolytic management  Chronic diarrhea -In the setting of IBS -Continue the use of Questran and Florastor. -Appears to be stable overall.  Acute systolic CHF (congestive heart failure) (HCC) 5/18--personally reviewed CXR--increase vascular congestion and R-opacities 5/18 Echo--EF 40-45%, low normal RV; mild MR/TR Continue lasix 40 IV bid Accurate I/Os Hold SGLT due to sepsis  Hypokalemia Replete Check mag  Lobar pneumonia ([facility]) Personally reviewed CXR--left lung opacities 5/18 CXR--personally reviewed--increase vascular congestion, increase R-opacities Check PCT--4.24 Continue ceftriaxone/azithro BNP--1360  Dysphagia - Per patient ongoing for over 2 weeks -Affecting liquids and solids in different locations. 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 -EGD once respiratory status more stable -Dysphagia 1 diet with thin liquids and close supervision has been recommended by speech therapy pending GI evaluation.  Severe sepsis ([place]) -Meeting severe sepsis criteria at time of admission with leukopenia, fever, tachycardia, presence of lactic acidosis (lactic acid 4.2), increased respiratory rate and the presence of hypoxia as part of organ dysfunction. -Lactic acid peaked 4.2 -Patient ended requiring up to 8 L high flow>>5L -Continue current antibiotics.   Depression -No suicidal ideation or hallucination -Continue the use of Lexapro and Wellbutrin.  Hyperlipidemia -Continue statins.  Uncontrolled type 2 diabetes mellitus with hyperglycemia, without long-term current use of insulin ([place]) -Patient with hyperglycemia -04/24/22 A1c 11.5 -Continue sliding scale insulin and Levemir; follow CBGs and adjust hypoglycemia regimen as needed. -Holding oral hypoglycemic agents while inpatient. CBGs largely controlled currently  Family  Communication:  no Family at bedside   Consultants:  GI   Code Status:  FULL    DVT Prophylaxis:  [place] Heparin      Procedures: As Listed in Progress Note Above   Antibiotics: Ceftriaxone 5/15>> Azithro 5/15>>      Subjective: Pt is breathing better.  Denies f/c, cp, n/v/d, abd pain  Objective: Vitals:   04/28/22 0800 04/28/22 0803 04/28/22 1332 04/28/22 1532  BP: (!) 141/77     Pulse: 85  86   Resp: (!) 30  (!) 29   Temp:    98.3 F (36.8 C)  TempSrc:    Oral  SpO2: 98% 94% 92%   Weight:      Height:        Intake/Output Summary (Last 24 hours) at 04/28/2022 1723 Last data filed at 04/28/2022 1637 Gross per 24 hour  Intake 450 ml  Output 5100 ml  Net -4650 ml   Weight change:  Exam:  General:  Pt is alert, follows commands appropriately, not in acute distress HEENT: No icterus, No thrush, No neck mass, [place]/AT Cardiovascular: RRR, S1/S2, no rubs, no gallops Respiratory: bilateral crackles Abdomen: Soft/+BS, non tender, non distended, no guarding Extremities: 1 +LE edema, No lymphangitis, No petechiae, No rashes, no synovitis   Data Reviewed: I have personally reviewed following labs and imaging studies Basic Metabolic Panel: Recent Labs  Lab 04/24/22 0735 04/24/22 0804 04/24/22 0921 04/25/22 0451 04/26/22 0424 04/27/22 0458 04/28/22 0342  NA 134* 133*  --  136 139 140 141  K 4.0 3.5  --  3.2* 3.3* 3.5 3.4*  CL 100 98  --  107 107 104 100  CO2 24  --   --  '25 28 27 29  '$ GLUCOSE 384* 360*  --  91 72 84 164*  BUN 14 12  --  '17 20 18 16  '$ CREATININE 0.64 0.50  --  0.48 0.49 0.42* 0.40*  CALCIUM 8.4*  --   --  8.0* 8.2* 8.3* 8.1*  MG  --   --  1.2*  --  1.9  --  1.6*  PHOS  --   --  2.2*  --   --   --   --    Liver Function Tests: Recent Labs  Lab 04/24/22 0735  AST 81*  ALT 45*  ALKPHOS 131*  BILITOT 1.0  PROT 6.1*  ALBUMIN 3.0*   No results for input(s): LIPASE, AMYLASE in the last 168 hours. No results for input(s): AMMONIA in the last  168 hours. Coagulation Profile: Recent Labs  Lab 04/24/22 0757  INR 1.3*   CBC: Recent Labs  Lab 04/24/22 0735 04/24/22 0804 04/25/22 0451 04/26/22 0424 04/27/22 0458  WBC 1.8*  --  1.2* 2.9* 3.4*  NEUTROABS 1.1*  --   --   --   --   HGB 14.0 14.3 12.9 12.2 12.5  HCT 42.4 42.0 40.5 38.5 38.3  MCV 102.7*  --  104.9* 104.6* 103.5*  PLT 143*  --  121* 124* 107*   Cardiac Enzymes: No results for input(s): CKTOTAL, CKMB, CKMBINDEX, TROPONINI in the last 168 hours. BNP: Invalid input(s): POCBNP CBG: Recent Labs  Lab 04/27/22 1535 04/27/22 2042 04/28/22 0750 04/28/22 1159 04/28/22 1524  GLUCAP 172* 199* 199* 238* 141*   HbA1C: No results for input(s): HGBA1C in the last 72 hours. Urine analysis:    Component Value Date/Time   COLORURINE AMBER (A) 04/24/2022 0715   APPEARANCEUR CLEAR 04/24/2022 0715   LABSPEC 1.024 04/24/2022 0715   PHURINE 6.0 04/24/2022 0715   GLUCOSEU >=500 (A) 04/24/2022 0715   HGBUR NEGATIVE 04/24/2022 0715  BILIRUBINUR NEGATIVE 04/24/2022 0715   BILIRUBINUR n 09/11/2016 1605   KETONESUR 5 (A) 04/24/2022 0715   PROTEINUR NEGATIVE 04/24/2022 0715   UROBILINOGEN negative 09/11/2016 1605   UROBILINOGEN 0.2 12/24/2007 2234   NITRITE POSITIVE (A) 04/24/2022 0715   LEUKOCYTESUR NEGATIVE 04/24/2022 0715   Sepsis Labs: '@LABRCNTIP'$ (procalcitonin:4,lacticidven:4) ) Recent Results (from the past 240 hour(s))  Resp Panel by RT-PCR (Flu A&B, Covid) Nasopharyngeal Swab     Status: None   Collection Time: 04/24/22  7:25 AM   Specimen: Nasopharyngeal Swab; Nasopharyngeal(NP) swabs in vial transport medium  Result Value Ref Range Status   SARS Coronavirus 2 by RT PCR NEGATIVE NEGATIVE Final    Comment: (NOTE) SARS-CoV-2 target nucleic acids are NOT DETECTED.  The SARS-CoV-2 RNA is generally detectable in upper respiratory specimens during the acute phase of infection. The lowest concentration of SARS-CoV-2 viral copies this assay can detect is 138  copies/mL. A negative result does not preclude SARS-Cov-2 infection and should not be used as the sole basis for treatment or other patient management decisions. A negative result may occur with  improper specimen collection/handling, submission of specimen other than nasopharyngeal swab, presence of viral mutation(s) within the areas targeted by this assay, and inadequate number of viral copies(<138 copies/mL). A negative result must be combined with clinical observations, patient history, and epidemiological information. The expected result is Negative.  Fact Sheet for Patients:  EntrepreneurPulse.com.au  Fact Sheet for Healthcare Providers:  IncredibleEmployment.be  This test is no t yet approved or cleared by the [place] FDA and  has been authorized for detection and/or diagnosis of SARS-CoV-2 by FDA under an Emergency Use Authorization (EUA). This EUA will remain  in effect (meaning this test can be used) for the duration of the COVID-19 declaration under Section 564(b)(1) of the Act, 21 U.S.C.section 360bbb-3(b)(1), unless the authorization is terminated  or revoked sooner.       Influenza A by PCR NEGATIVE NEGATIVE Final   Influenza B by PCR NEGATIVE NEGATIVE Final    Comment: (NOTE) The Xpert Xpress SARS-CoV-2/FLU/RSV plus assay is intended as an aid in the diagnosis of influenza from Nasopharyngeal swab specimens and should not be used as a sole basis for treatment. Nasal washings and aspirates are unacceptable for Xpert Xpress SARS-CoV-2/FLU/RSV testing.  Fact Sheet for Patients: EntrepreneurPulse.com.au  Fact Sheet for Healthcare Providers: IncredibleEmployment.be  This test is not yet approved or cleared by the [place] FDA and has been authorized for detection and/or diagnosis of SARS-CoV-2 by FDA under an Emergency Use Authorization (EUA). This EUA will remain in effect (meaning  this test can be used) for the duration of the COVID-19 declaration under Section 564(b)(1) of the Act, 21 U.S.C. section 360bbb-3(b)(1), unless the authorization is terminated or revoked.  Performed at [facility], [address] [place], [place] [address]   Urine Culture     Status: Abnormal   Collection Time: 04/24/22  7:33 AM   Specimen: Urine, Catheterized  Result Value Ref Range Status   Specimen Description   Final    URINE, CATHETERIZED Performed at [facility], [address], [place], [place] [address]    Special Requests   Final    NONE Performed at [facility], [address] [place], [place] [address]    Culture >=100,000 COLONIES/mL ESCHERICHIA COLI (A)  Final   Report Status 04/26/2022 FINAL  Final   Organism ID, Bacteria ESCHERICHIA COLI (A)  Final      Susceptibility   Escherichia coli -  MIC*    AMPICILLIN >=32 RESISTANT Resistant     CEFAZOLIN <=4 SENSITIVE Sensitive     CEFEPIME <=0.12 SENSITIVE Sensitive     CEFTRIAXONE <=0.25 SENSITIVE Sensitive     CIPROFLOXACIN >=4 RESISTANT Resistant     GENTAMICIN <=1 SENSITIVE Sensitive     IMIPENEM <=0.25 SENSITIVE Sensitive     NITROFURANTOIN <=16 SENSITIVE Sensitive     TRIMETH/SULFA <=20 SENSITIVE Sensitive     AMPICILLIN/SULBACTAM 16 INTERMEDIATE Intermediate     PIP/TAZO <=4 SENSITIVE Sensitive     * >=100,000 COLONIES/mL ESCHERICHIA COLI  Blood Culture (routine x 2)     Status: None (Preliminary result)   Collection Time: 04/24/22  7:57 AM   Specimen: BLOOD LEFT HAND  Result Value Ref Range Status   Specimen Description BLOOD LEFT HAND  Final   Special Requests   Final    BOTTLES DRAWN AEROBIC AND ANAEROBIC Blood Culture results may not be optimal due to an inadequate volume of blood received in culture bottles   Culture   Final    NO GROWTH 4 DAYS Performed at [facility], [address], [place], [place] [address]    Report Status PENDING  Incomplete  Blood Culture (routine x 2)     Status: None  (Preliminary result)   Collection Time: 04/24/22  7:57 AM   Specimen: BLOOD RIGHT WRIST  Result Value Ref Range Status   Specimen Description BLOOD RIGHT WRIST  Final   Special Requests   Final    BOTTLES DRAWN AEROBIC AND ANAEROBIC Blood Culture adequate volume   Culture   Final    NO GROWTH 4 DAYS Performed at [facility], [address], [place], [place] [address]    Report Status PENDING  Incomplete  MRSA Next Gen by PCR, Nasal     Status: None   Collection Time: 04/24/22 10:54 AM   Specimen: Urine, Clean Catch; Nasal Swab  Result Value Ref Range Status   MRSA by PCR Next Gen NOT DETECTED NOT DETECTED Final    Comment: (NOTE) The GeneXpert MRSA Assay (FDA approved for NASAL specimens only), is one component of a comprehensive MRSA colonization surveillance program. It is not intended to diagnose MRSA infection nor to guide or monitor treatment for MRSA infections. Test performance is not FDA approved in patients less than 51 years old. Performed at [facility], [address], [place], [place] [address]      Scheduled Meds:  budesonide (PULMICORT) nebulizer solution  0.5 mg Nebulization BID   buPROPion  150 mg Oral Daily   chlorhexidine  15 mL Mouth Rinse BID   Chlorhexidine Gluconate Cloth  6 each Topical Daily   cholestyramine light  4 g Oral Daily   dextromethorphan-guaiFENesin  1 tablet Oral BID   escitalopram  10 mg Oral Daily   furosemide  40 mg Intravenous BID   heparin injection (subcutaneous)  5,000 Units Subcutaneous Q8H   insulin aspart  0-9 Units Subcutaneous TID WC   insulin detemir  10 Units Subcutaneous Daily   ipratropium  0.5 mg Nebulization Q6H   levalbuterol  0.63 mg Nebulization Q6H   mouth rinse  15 mL Mouth Rinse q12n4p   pantoprazole  40 mg Oral Daily   potassium chloride  20 mEq Oral Daily   rosuvastatin  5 mg Oral Daily   saccharomyces boulardii  250 mg Oral BID   Continuous Infusions:  magnesium sulfate bolus IVPB 2 g (04/28/22 1722)     Procedures/Studies: US Venous Img Lower  Bilateral (DVT)  Result Date: 04/27/2022 CLINICAL DATA:  Bilateral lower extremity pain and swelling EXAM: BILATERAL LOWER EXTREMITY VENOUS DOPPLER ULTRASOUND TECHNIQUE: Gray-scale sonography with graded compression, as well as color Doppler and duplex ultrasound were performed to evaluate the lower extremity deep venous systems from the level of the common femoral vein and including the common femoral, femoral, profunda femoral, popliteal and calf veins including the posterior tibial, peroneal and gastrocnemius veins when visible. The superficial great saphenous vein was also interrogated. Spectral Doppler was utilized to evaluate flow at rest and with distal augmentation maneuvers in the common femoral, femoral and popliteal veins. COMPARISON:  None Available. FINDINGS: RIGHT LOWER EXTREMITY Common Femoral Vein: No evidence of thrombus. Normal compressibility, respiratory phasicity and response to augmentation. Saphenofemoral Junction: No evidence of thrombus. Normal compressibility and flow on color Doppler imaging. Profunda Femoral Vein: No evidence of thrombus. Normal compressibility and flow on color Doppler imaging. Femoral Vein: No evidence of thrombus. Normal compressibility, respiratory phasicity and response to augmentation. Popliteal Vein: No evidence of thrombus. Normal compressibility, respiratory phasicity and response to augmentation. Calf Veins: No evidence of thrombus. Normal compressibility and flow on color Doppler imaging. Superficial Great Saphenous Vein: No evidence of thrombus. Normal compressibility. Venous Reflux:  None. Other Findings:  None. LEFT LOWER EXTREMITY Common Femoral Vein: No evidence of thrombus. Normal compressibility, respiratory phasicity and response to augmentation. Saphenofemoral Junction: No evidence of thrombus. Normal compressibility and flow on color Doppler imaging. Profunda Femoral Vein: No evidence of thrombus.  Normal compressibility and flow on color Doppler imaging. Femoral Vein: No evidence of thrombus. Normal compressibility, respiratory phasicity and response to augmentation. Popliteal Vein: No evidence of thrombus. Normal compressibility, respiratory phasicity and response to augmentation. Calf Veins: No evidence of thrombus. Normal compressibility and flow on color Doppler imaging. Superficial Great Saphenous Vein: No evidence of thrombus. Normal compressibility. Venous Reflux:  None. Other Findings:  None. IMPRESSION: No evidence of deep venous thrombosis in either lower extremity. Electronically Signed   By: Jacqulynn Cadet M.D.   On: 04/27/2022 10:33   DG CHEST PORT 1 VIEW  Result Date: 04/27/2022 CLINICAL DATA:  Follow-up lobar pneumonia. EXAM: PORTABLE CHEST 1 VIEW COMPARISON:  Chest x-ray May 15, 23. FINDINGS: Increasing left greater than right airspace opacities. Airspace opacities are confluent in the left lung and patchy in the right lung. No visible pleural effusions or pneumothorax. Cardiac silhouette is largely obscured. No displaced fracture. IMPRESSION: Increasing left greater than right airspace opacities, concerning for worsening pneumonia. These results will be called to the ordering clinician or representative by the Radiologist Assistant, and communication documented in the PACS or [employer]. Electronically Signed   By: Margaretha Sheffield M.D.   On: 04/27/2022 08:07   DG Chest Port 1 View  Result Date: 04/24/2022 CLINICAL DATA:  Short of breath EXAM: PORTABLE CHEST 1 VIEW COMPARISON:  12/14/2020 FINDINGS: New consolidative opacities in the left lung. No pleural effusion. No pneumothorax. Heart size is normal. IMPRESSION: Multifocal left-sided pneumonia. Electronically Signed   By: Macy Mis M.D.   On: 04/24/2022 07:46   ECHOCARDIOGRAM COMPLETE  Result Date: 04/27/2022    ECHOCARDIOGRAM REPORT   Patient Name:   Kelli [name] Date of Exam: 04/27/2022 Medical Rec #:   [id number]        Height:       64.0 in Accession #:    [id number]       Weight:       232.1 lb Date of Birth:  1970/12/16  BSA:          2.084 m Patient Age:    32 years         BP:           133/65 mmHg Patient Gender: F                HR:           86 bpm. Exam Location:  [facility] Procedure: 2D Echo, Cardiac Doppler and Color Doppler Indications:    CHF  History:        Patient has no prior history of Echocardiogram examinations.                 CHF; Risk Factors:Hypertension, Diabetes and Dyslipidemia.  Sonographer:    Wenda Low Referring Phys: [phone] Jobe Mutch IMPRESSIONS  1. Left ventricular ejection fraction, by estimation, is 40 to 45%. The left ventricle has mildly decreased function. The left ventricle demonstrates global hypokinesis. The left ventricular internal cavity size was mildly dilated.  2. Right ventricular systolic function is low normal. The right ventricular size is normal. There is mildly elevated pulmonary artery systolic pressure.  3. The mitral valve is normal in structure. Mild mitral valve regurgitation.  4. The aortic valve is tricuspid. Aortic valve regurgitation is not visualized. Aortic valve sclerosis is present, with no evidence of aortic valve stenosis.  5. The inferior vena cava is dilated in size with <50% respiratory variability, suggesting right atrial pressure of 15 mmHg. FINDINGS  Left Ventricle: Left ventricular ejection fraction, by estimation, is 40 to 45%. The left ventricle has mildly decreased function. The left ventricle demonstrates global hypokinesis. The left ventricular internal cavity size was mildly dilated. There is  no left ventricular hypertrophy. Right Ventricle: The right ventricular size is normal. Right vetricular wall thickness was not assessed. Right ventricular systolic function is low normal. There is mildly elevated pulmonary artery systolic pressure. The tricuspid regurgitant velocity is  2.42 m/s, and with an assumed right atrial  pressure of 15 mmHg, the estimated right ventricular systolic pressure is 25.3 mmHg. Left Atrium: Left atrial size was normal in size. Right Atrium: Right atrial size was normal in size. Pericardium: There is no evidence of pericardial effusion. Mitral Valve: The mitral valve is normal in structure. Mild mitral valve regurgitation. MV peak gradient, 3.8 mmHg. The mean mitral valve gradient is 2.0 mmHg. Tricuspid Valve: The tricuspid valve is normal in structure. Tricuspid valve regurgitation is mild. Aortic Valve: The aortic valve is tricuspid. Aortic valve regurgitation is not visualized. Aortic valve sclerosis is present, with no evidence of aortic valve stenosis. Aortic valve mean gradient measures 4.0 mmHg. Aortic valve peak gradient measures 6.9  mmHg. Aortic valve area, by VTI measures 2.16 cm. Pulmonic Valve: The pulmonic valve was grossly normal. Pulmonic valve regurgitation is not visualized. Aorta: The aortic root is normal in size and structure. Venous: The inferior vena cava is dilated in size with less than 50% respiratory variability, suggesting right atrial pressure of 15 mmHg. IAS/Shunts: No atrial level shunt detected by color flow Doppler.  LEFT VENTRICLE PLAX 2D LVIDd:         5.20 cm     Diastology LVIDs:         3.65 cm     LV e' medial:    7.94 cm/s LV PW:         0.80 cm     LV E/e' medial:  8.7 LV IVS:  1.00 cm     LV e' lateral:   14.90 cm/s LVOT diam:     2.00 cm     LV E/e' lateral: 4.6 LV SV:         57 LV SV Index:   27 LVOT Area:     3.14 cm  LV Volumes (MOD) LV vol d, MOD A2C: 94.9 ml LV vol d, MOD A4C: 81.3 ml LV vol s, MOD A2C: 48.9 ml LV vol s, MOD A4C: 42.9 ml LV SV MOD A2C:     46.0 ml LV SV MOD A4C:     81.3 ml LV SV MOD BP:      42.0 ml RIGHT VENTRICLE RV Basal diam:  3.55 cm RV Mid diam:    3.70 cm RV S prime:     12.90 cm/s TAPSE (M-mode): 2.4 cm LEFT ATRIUM             Index        RIGHT ATRIUM           Index LA diam:        4.30 cm 2.06 cm/m   RA Area:     15.70  cm LA Vol (A2C):   53.4 ml 25.62 ml/m  RA Volume:   44.80 ml  21.49 ml/m LA Vol (A4C):   55.1 ml 26.44 ml/m LA Biplane Vol: 58.7 ml 28.16 ml/m  AORTIC VALVE                    PULMONIC VALVE AV Area (Vmax):    2.27 cm     PV Vmax:       0.66 m/s AV Area (Vmean):   1.99 cm     PV Peak grad:  1.7 mmHg AV Area (VTI):     2.16 cm AV Vmax:           131.00 cm/s AV Vmean:          90.500 cm/s AV VTI:            0.262 m AV Peak Grad:      6.9 mmHg AV Mean Grad:      4.0 mmHg LVOT Vmax:         94.60 cm/s LVOT Vmean:        57.300 cm/s LVOT VTI:          0.180 m LVOT/AV VTI ratio: 0.69  AORTA Ao Root diam: 2.70 cm MITRAL VALVE               TRICUSPID VALVE MV Area (PHT): 4.17 cm    TR Peak grad:   23.4 mmHg MV Area VTI:   2.46 cm    TR Vmax:        242.00 cm/s MV Peak grad:  3.8 mmHg MV Mean grad:  2.0 mmHg    SHUNTS MV Vmax:       0.98 m/s    Systemic VTI:  0.18 m MV Vmean:      61.6 cm/s   Systemic Diam: 2.00 cm MV Decel Time: 182 msec MV E velocity: 69.00 cm/s MV A velocity: 77.60 cm/s MV E/A ratio:  0.89 Dorris Carnes MD Electronically signed by Dorris Carnes MD Signature Date/Time: 04/27/2022/10:03:09 PM    Final    DG ESOPHAGUS W DOUBLE CM (HD)  Result Date: 04/25/2022 CLINICAL DATA:  Dysphagia, feels like foods are sticking in her chest and lower cervical region, pneumonia EXAM: ESOPHOGRAM / BARIUM SWALLOW / BARIUM TABLET STUDY  TECHNIQUE: Combined double contrast and single contrast examination performed using effervescent crystals, thick barium liquid, and thin barium liquid. The patient was observed with fluoroscopy swallowing a 13 mm barium sulphate tablet. FLUOROSCOPY: Radiation Exposure Index (as provided by the fluoroscopic device): 57.0 mGy Kerma COMPARISON:  None Available. FINDINGS: Esophageal distention: Smooth narrowing at/just above gastroesophageal junction. Remainder of esophagus appears to distend normally. Filling defects:  No filling defects 12.5 mm barium tablet: Obstructed at the GE junction  and did not pass into stomach despite multiple swallows of water and thick barium. 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 No residuals. 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 Electronically Signed   By: Lavonia Dana M.D.   On: 04/25/2022 10:59    Orson Eva, DO  [facility]  If 7PM-7AM, please contact night-coverage [email or web address] Password [email or web address] 04/28/2022, 5:23 PM   LOS: 4 days

## 2022-04-28 NOTE — Progress Notes (Addendum)
Inpatient Diabetes Program Recommendations  AACE/ADA: New Consensus Statement on Inpatient Glycemic Control (2015)  Target Ranges:  Prepandial:   less than 140 mg/dL      Peak postprandial:   less than 180 mg/dL (1-2 hours)      Critically ill patients:  140 - 180 mg/dL   Lab Results  Component Value Date   GLUCAP 199 (H) 04/28/2022   HGBA1C 11.5 (H) 04/24/2022    Review of Glycemic Control  Latest Reference Range & Units 04/27/22 07:55 04/27/22 11:20 04/27/22 15:35 04/27/22 20:42 04/28/22 07:50  Glucose-Capillary 70 - 99 mg/dL 82 147 (H) 172 (H) 199 (H) 199 (H)  (H): Data is abnormally high  Diabetes history: DM2 Outpatient Diabetes medications: Metformin 500 mg BID Current orders for Inpatient glycemic control: Levemir 10 units QD, Novolog 0-9 units TID  Spoke with patient at bedside.  Reviewed patient's current A1c of 11.5% (average blood sugar of 283 mg/dL). Explained what a A1c is and what it measures. Also reviewed goal A1c with patient, importance of good glucose control @ home, and blood sugar goals.  Ordered LWWD booklet.  She states she has recently gotten back on track with her diet and exercise.  She says her last A1C was around 13%.  She does not drink beverages with sugar and has been watching her CHO intake.    She confirms above home Metformin 500 mg BID.  She has used basaglar via insulin pen in the past and is aware how to use.  May consider discharging her on low dose basal insulin and follow up with pcp.    She has a functioning glucometer.  She is aware if she discharges on insulin she will need to check CBGs more often.  She is aware of hypoglycemia, signs, symptoms and treatments.  She treats with juice.    Asked for benefit check on basal insulin.  Will update note with preferred insulin and coo-pay.  Rezvoglar is preferred with a $25 co-pay.  This is a new glargine which was recently FDA approved in March 2023.  Code for ordering is [id number]  Will continue  to follow while inpatient.  Thank you, Kelli Dixon, MSN, [facility] Diabetes Coordinator Inpatient Diabetes Program [phone] (team pager from 8a-5p)

## 2022-04-28 NOTE — TOC Benefit Eligibility Note (Signed)
Patient Research scientist (life sciences) completed.     The patient is currently admitted and upon discharge could be taking REZVOGLAR KWIKPEN 100 UNIT/ML .   The current 30 day co-pay is, $25.   The patient is insured through [employer].

## 2022-04-28 NOTE — Progress Notes (Signed)
Subjective:  Patient says she is breathing better.  According to nursing staff she has been diuresing well. Patient says she has had dysphagia for about 6 months.  She has had difficulty with solids as well as liquids.  She has had intermittent heartburn that she has been able to control with famotidine on as-needed basis.  Current Medications:  Current Facility-Administered Medications:    acetaminophen (TYLENOL) tablet 650 mg, 650 mg, Oral, Q6H PRN, 650 mg at 04/28/22 1205 **OR** acetaminophen (TYLENOL) suppository 650 mg, 650 mg, Rectal, Q6H PRN, Barton Dubois, MD   budesonide (PULMICORT) nebulizer solution 0.5 mg, 0.5 mg, Nebulization, BID, Barton Dubois, MD, 0.5 mg at 04/28/22 0802   buPROPion (WELLBUTRIN XL) 24 hr tablet 150 mg, 150 mg, Oral, Daily, Barton Dubois, MD, 150 mg at 04/28/22 0939   chlorhexidine (PERIDEX) 0.12 % solution 15 mL, 15 mL, Mouth Rinse, BID, Barton Dubois, MD, 15 mL at 04/28/22 7035   Chlorhexidine Gluconate Cloth 2 % PADS 6 each, 6 each, Topical, Daily, Barton Dubois, MD, 6 each at 04/28/22 0940   cholestyramine light (PREVALITE) packet 4 g, 4 g, Oral, Daily, Barton Dubois, MD   dextromethorphan-guaiFENesin ([place] DM) 30-600 MG per 12 hr tablet 1 tablet, 1 tablet, Oral, BID, Barton Dubois, MD, 1 tablet at 04/28/22 0939   escitalopram (LEXAPRO) tablet 10 mg, 10 mg, Oral, Daily, Barton Dubois, MD, 10 mg at 04/28/22 0939   furosemide (LASIX) injection 40 mg, 40 mg, Intravenous, BID, Tat, David, MD, 40 mg at 04/28/22 0937   heparin injection 5,000 Units, 5,000 Units, Subcutaneous, Q8H, Barton Dubois, MD, 5,000 Units at 04/28/22 1206   insulin aspart (novoLOG) injection 0-9 Units, 0-9 Units, Subcutaneous, TID WC, Tat, Shanon Brow, MD, 3 Units at 04/28/22 1206   insulin detemir (LEVEMIR) injection 10 Units, 10 Units, Subcutaneous, Daily, Tat, David, MD, 10 Units at 04/28/22 0938   ipratropium (ATROVENT) nebulizer solution 0.5 mg, 0.5 mg, Nebulization, Q6H, Barton Dubois, MD, 0.5 mg at 04/28/22 1332   levalbuterol (XOPENEX) nebulizer solution 0.63 mg, 0.63 mg, Nebulization, Q6H, Barton Dubois, MD, 0.63 mg at 04/28/22 1332   MEDLINE mouth rinse, 15 mL, Mouth Rinse, q12n4p, Barton Dubois, MD, 15 mL at 04/28/22 1207   ondansetron (ZOFRAN) tablet 4 mg, 4 mg, Oral, Q6H PRN **OR** ondansetron (ZOFRAN) injection 4 mg, 4 mg, Intravenous, Q6H PRN, Barton Dubois, MD, 4 mg at 04/26/22 1811   pantoprazole (PROTONIX) EC tablet 40 mg, 40 mg, Oral, Daily, Barton Dubois, MD, 40 mg at 04/28/22 0093   rosuvastatin (CRESTOR) tablet 5 mg, 5 mg, Oral, Daily, Barton Dubois, MD, 5 mg at 04/28/22 8182   saccharomyces boulardii (FLORASTOR) capsule 250 mg, 250 mg, Oral, BID, Barton Dubois, MD, 250 mg at 04/28/22 0939   sodium chloride (OCEAN) 0.65 % nasal spray 1 spray, 1 spray, Each Nare, PRN, Tat, Shanon Brow, MD, 1 spray at 04/26/22 1806   SUMAtriptan (IMITREX) tablet 50 mg, 50 mg, Oral, Q2H PRN, Barton Dubois, MD, 50 mg at 04/28/22 1205   Objective: Blood pressure (!) 141/77, pulse 85, temperature 98.6 F (37 C), temperature source Axillary, resp. rate (!) 30, height $RemoveBe'5\' 4"'yeMKHqxFO$  (1.626 m), weight 105.3 kg, last menstrual period 11/25/2019, SpO2 94 %. Patient is alert and in no acute distress. She is getting ready to eat her lunch.   Labs/studies Results:      Latest Ref Rng & Units 04/27/2022    4:58 AM 04/26/2022    4:24 AM 04/25/2022    4:51 AM  CBC  WBC  4.0 - 10.5 K/uL 3.4   2.9   1.2    Hemoglobin 12.0 - 15.0 g/dL 12.5   12.2   12.9    Hematocrit 36.0 - 46.0 % 38.3   38.5   40.5    Platelets 150 - 400 K/uL 107   124   121         Latest Ref Rng & Units 04/28/2022    3:42 AM 04/27/2022    4:58 AM 04/26/2022    4:24 AM  CMP  Glucose 70 - 99 mg/dL 164   84   72    BUN 6 - 20 mg/dL $Remove'16   18   20    'MujCRVr$ Creatinine 0.44 - 1.00 mg/dL 0.40   0.42   0.49    Sodium 135 - 145 mmol/L 141   140   139    Potassium 3.5 - 5.1 mmol/L 3.4   3.5   3.3    Chloride 98 - 111 mmol/L 100    104   107    CO2 22 - 32 mmol/L $RemoveB'29   27   28    'afzjKiwo$ Calcium 8.9 - 10.3 mg/dL 8.1   8.3   8.2         Latest Ref Rng & Units 04/24/2022    7:35 AM 10/20/2021   10:24 AM 02/17/2019    2:36 PM  Hepatic Function  Total Protein 6.5 - 8.1 g/dL 6.1   6.8   6.4    Albumin 3.5 - 5.0 g/dL 3.0   4.2   3.6    AST 15 - 41 U/L 81   16   17    ALT 0 - 44 U/L 45   17   14    Alk Phosphatase 38 - 126 U/L 131   114   87    Total Bilirubin 0.3 - 1.2 mg/dL 1.0   0.6   1.0      Barium study images reviewed. Smooth stricture at GE junction obstructing passage of barium pill. Also appeared to be mild tertiary contractions concerning for esophageal dysmotility.   Assessment:  #1.  Esophageal dysphagia.  Esophagogram suggest distal esophageal stricture.  No significant history of heartburn.  She has taken doxycycline in the past but does not remember if she had any difficulty swallowing this medication.  Patient will be evaluated with esophagogastroduodenoscopy and dilation when deemed to be stable from medical standpoint.  #2.  CHF.  She has been evaluated by cardiology earlier today.  Echo revealed mildly dilated LV with reduced ejection fraction of 40 to 45% and global hypokinesis.  #3.  Respiratory failure secondary to multilobar pneumonia.  Patient improving with antibiotic therapy.  Plan:  We will plan esophagogastroduodenoscopy with esophageal dilation and deemed to be stable from medical standpoint.

## 2022-04-28 NOTE — Plan of Care (Signed)

## 2022-04-28 NOTE — Assessment & Plan Note (Addendum)
repleted ?

## 2022-04-28 NOTE — Assessment & Plan Note (Addendum)
5/18--personally reviewed CXR--increase vascular congestion and R-opacities 5/18 Echo--EF 40-45%, low normal RV; mild MR/TR She was treated with lasix 40 IV bid>>po lasix on 5/24 Accurate I/Os--incomplete Hold SGLT for outpatient follow up consideration per cardiology service Spironolactone added 05/02/22

## 2022-04-28 NOTE — Consult Note (Signed)
Cardiology Consult:   Patient ID: Kelli [name] MRN: [id number]; DOB: 01-03-1971   Admission date: 04/24/2022  PCP:  Walnut Nation, MD   [facility] Providers Cardiologist:  New to [facility]  Chief Complaint:  New HF  Patient Profile:   Kelli [name] is a 51 y.o. female with HLD with DM, Anemia and diverticulosis, presenting with PNA.  Found to have low EF as part of her evaluation who is being seen 04/28/2022 for the evaluation of HF.  History of Present Illness:   Kelli [name] has not bee feeling well for some time.  In 2023 she had struggled with depression.  Because of this, she cannot tell whether some of her pains, palpitations, or fatigue is related to systemic illness.  At time of Easter 2023, she had no CP, SOB, weight gain or palpitations.  Patient notes that she came to the ED because of UTI.  Had a telemedicine visit because of abdominal pain back pain and urinary discomfort; she is prone to UTIs.  Her telemedicine provider wanted her to get ED evaluation.  Found to have productive cough and DOE with yellow sputum;   Had some dysphagia.  Found to heave Lactate of 4.2, placed on 8 L; GI called for aspiration PNA. BNP was 205.    Since she has been here she has had no chest pain, chest pressure, chest tightness, chest stinging.  Still have  No shortness of breath, DOE .  No PND or orthopnea. ,No syncope or near syncope. Notes no palpitations or funny heart beats.     Does not have a primary cardiologist.  Received ~ 4 L of resuscitation.  Able to wean O2 down to 5 L.  Stil having SOB.   Echo EF 40-45% IVC Dilation RA pressure 15 mm HG BNP increased from 5/15 to 5/18  Responding well to IV lasix.  Cardiology called.   Past Medical History:  Diagnosis Date   Allergy    Anemia    Arthritis    knee   Chronic headache    Colon polyps 03/28/11   Depression    Diabetes mellitus    Diverticulosis    GAD (generalized anxiety disorder)     Gallstones    GERD (gastroesophageal reflux disease)    Heart murmur    Hyperlipidemia    Migraines    Obesity     Past Surgical History:  Procedure Laterality Date   Marquand Bilateral 1995     Medications Prior to Admission: Prior to Admission medications   Medication Sig Start Date End Date Taking? Authorizing Provider  acetaminophen (TYLENOL) 325 MG tablet Take 650 mg by mouth as needed.   Yes [provider]  albuterol (VENTOLIN HFA) 108 (90 Base) MCG/ACT inhaler Inhale 1 puff into the lungs as needed. 01/30/20  Yes [provider]  buPROPion (WELLBUTRIN XL) 150 MG 24 hr tablet Take 150 mg by mouth daily. 04/06/22  Yes [provider]  escitalopram (LEXAPRO) 20 MG tablet Take 20 mg by mouth daily. 03/23/22  Yes [provider]  hydrOXYzine (ATARAX) 10 MG tablet Take 10-20 mg by mouth at bedtime as needed for anxiety (slleping). 04/06/22  Yes [provider]  metFORMIN (GLUCOPHAGE-XR) 500 MG 24 hr tablet Take 500 mg by mouth 2 (two) times daily. 09/29/21  Yes [provider]  cholestyramine (QUESTRAN) 4 g packet Take 1 packet (4 g total) by mouth daily. Patient not taking: Reported on 04/25/2022 10/20/21   Zehr, Laban Emperor, PA-C  hyoscyamine (LEVSIN) 0.125 MG tablet Take 1 tablet (0.125 mg total) by mouth every 6 (six) hours as needed. Patient not taking: Reported on 04/25/2022 10/20/21   Loralie Champagne, PA-C     Allergies:    Allergies  Allergen Reactions   Aspirin     Vomiting and nausea   Neosporin [Neomycin-Bacitracin Zn-Polymyx] Rash    "blisters"    Social History:   Social History   Socioeconomic History   Marital status: Married    Spouse name: Not on file   Number of children: 2   Years of education: Not on file   Highest education level: Not on file  Occupational History   Occupation: customer  service rep  Tobacco Use   Smoking status: Every Day    Packs/day: 0.75    Years: 1.00    Pack years: 0.75    Types: Cigarettes    Last attempt to quit: 10/04/2015    Years since quitting: 6.5   Smokeless tobacco: Never  Vaping Use   Vaping Use: Never used  Substance and Sexual Activity   Alcohol use: Not Currently    Comment: occasionally   Drug use: No   Sexual activity: Yes    Partners: Male    Birth control/protection: Surgical, Condom    Comment: BTL  Other Topics Concern   Not on file  Social History Narrative   Not on file   Social Determinants of Health   Financial Resource Strain: Not on file  Food Insecurity: Not on file  Transportation Needs: Not on file  Physical Activity: Not on file  Stress: Not on file  Social Connections: Not on file  Intimate Partner Violence: Not on file    Family History:   The patient's family history includes Colon polyps in her mother; Diabetes in her maternal grandmother; Heart disease in her mother; Kidney failure in her daughter. There is no history of Colon cancer, Rectal cancer, Stomach cancer, or Esophageal cancer.    ROS:  Please see the history of present illness.  All other ROS reviewed and negative.     Physical Exam/Data:   Vitals:   04/28/22 0600 04/28/22 0700 04/28/22 0800 04/28/22 0803  BP: 123/69  (!) 141/77   Pulse: 87  85   Resp: (!) 26  (!) 30   Temp:  98.6 F (37 C)    TempSrc:  Axillary    SpO2: 94%  98% 94%  Weight:      Height:        Intake/Output Summary (Last 24 hours) at 04/28/2022 1019 Last data filed at 04/28/2022 0732 Gross per 24 hour  Intake 250.07 ml  Output 3200 ml  Net -2949.93 ml      04/25/2022    4:29 AM 04/24/2022   11:00 AM 11/14/2021   10:51 AM  Last 3 Weights  Weight (lbs) 232 lb 2.3 oz 226 lb 3.1 oz 225 lb  Weight (kg) 105.3 kg 102.6 kg 102.059 kg     Body mass index is 39.85 kg/m.   Gen: no distress, morbid obesity   Cardiac: No Rubs or Gallops, no Murmur, RRR  +2  radial pulses Respiratory: bilateral rhonchi, increased  effort, increased  respiratory rate, seen on 5 L GI: Soft, nontender, non-distended  MS: No  edema;  moves all extremities Integument: Skin feels  warm Neuro:  At time of evaluation, alert and oriented to person/place/time/situation  Psych: Normal affect, patient feels better  Tele: Sinus bradycardia and sinus rhythm   Laboratory Data:  High Sensitivity Troponin:   Recent Labs  Lab 04/24/22 0735 04/24/22 0921  TROPONINIHS 5 4      Chemistry Recent Labs  Lab 04/26/22 0424 04/27/22 0458 04/28/22 0342  NA 139 140 141  K 3.3* 3.5 3.4*  CL 107 104 100  CO2 '28 27 29  '$ GLUCOSE 72 84 164*  BUN '20 18 16  '$ CREATININE 0.49 0.42* 0.40*  CALCIUM 8.2* 8.3* 8.1*  MG 1.9  --  1.6*  GFRNONAA >60 >60 >60  ANIONGAP 4* 9 12    Recent Labs  Lab 04/24/22 0735  PROT 6.1*  ALBUMIN 3.0*  AST 81*  ALT 45*  ALKPHOS 131*  BILITOT 1.0   Lipids No results for input(s): CHOL, TRIG, HDL, LABVLDL, LDLCALC, CHOLHDL in the last 168 hours. Hematology Recent Labs  Lab 04/26/22 0424 04/27/22 0458  WBC 2.9* 3.4*  RBC 3.68* 3.70*  HGB 12.2 12.5  HCT 38.5 38.3  MCV 104.6* 103.5*  MCH 33.2 33.8  MCHC 31.7 32.6  RDW 13.4 13.5  PLT 124* 107*   Thyroid  Recent Labs  Lab 04/24/22 0757  TSH 1.486   BNP Recent Labs  Lab 04/24/22 0735 04/27/22 0458  BNP 209.0* 1,360.0*    DDimer No results for input(s): DDIMER in the last 168 hours.   Radiology/Studies:  US Venous Img Lower Bilateral (DVT)  Result Date: 04/27/2022 CLINICAL DATA:  Bilateral lower extremity pain and swelling EXAM: BILATERAL LOWER EXTREMITY VENOUS DOPPLER ULTRASOUND TECHNIQUE: Gray-scale sonography with graded compression, as well as color Doppler and duplex ultrasound were performed to evaluate the lower extremity deep venous systems from the level of the common femoral vein and including the common femoral, femoral, profunda femoral, popliteal and calf veins  including the posterior tibial, peroneal and gastrocnemius veins when visible. The superficial great saphenous vein was also interrogated. Spectral Doppler was utilized to evaluate flow at rest and with distal augmentation maneuvers in the common femoral, femoral and popliteal veins. COMPARISON:  None Available. FINDINGS: RIGHT LOWER EXTREMITY Common Femoral Vein: No evidence of thrombus. Normal compressibility, respiratory phasicity and response to augmentation. Saphenofemoral Junction: No evidence of thrombus. Normal compressibility and flow on color Doppler imaging. Profunda Femoral Vein: No evidence of thrombus. Normal compressibility and flow on color Doppler imaging. Femoral Vein: No evidence of thrombus. Normal compressibility, respiratory phasicity and response to augmentation. Popliteal Vein: No evidence of thrombus. Normal compressibility, respiratory phasicity and response to augmentation. Calf Veins: No evidence of thrombus. Normal compressibility and flow on color Doppler imaging. Superficial Great Saphenous Vein: No evidence of thrombus. Normal compressibility. Venous Reflux:  None. Other Findings:  None. LEFT LOWER EXTREMITY Common Femoral Vein: No evidence of thrombus. Normal compressibility, respiratory phasicity and response to augmentation. Saphenofemoral Junction: No evidence of thrombus. Normal compressibility and flow on color Doppler imaging. Profunda Femoral Vein: No evidence of thrombus. Normal compressibility and flow on color Doppler imaging. Femoral Vein: No evidence of thrombus. Normal compressibility, respiratory phasicity and response to augmentation. Popliteal Vein: No evidence of thrombus. Normal compressibility, respiratory phasicity and response to augmentation. Calf Veins: No evidence of thrombus. Normal compressibility and flow on color Doppler imaging. Superficial Great Saphenous Vein: No evidence of thrombus. Normal compressibility. Venous Reflux:  None. Other Findings:  None.  IMPRESSION: No evidence of deep venous thrombosis in either lower extremity. Electronically  Signed   By: Jacqulynn Cadet M.D.   On: 04/27/2022 10:33   ECHOCARDIOGRAM COMPLETE  Result Date: 04/27/2022    ECHOCARDIOGRAM REPORT   Patient Name:   FIANNA SNOWBALL Date of Exam: 04/27/2022 Medical Rec #:  [id number]        Height:       64.0 in Accession #:    [id number]       Weight:       232.1 lb Date of Birth:  02/24/1971         BSA:          2.084 m Patient Age:    68 years         BP:           133/65 mmHg Patient Gender: F                HR:           86 bpm. Exam Location:  [facility] Procedure: 2D Echo, Cardiac Doppler and Color Doppler Indications:    CHF  History:        Patient has no prior history of Echocardiogram examinations.                 CHF; Risk Factors:Hypertension, Diabetes and Dyslipidemia.  Sonographer:    Wenda Low Referring Phys: [phone] DAVID TAT IMPRESSIONS  1. Left ventricular ejection fraction, by estimation, is 40 to 45%. The left ventricle has mildly decreased function. The left ventricle demonstrates global hypokinesis. The left ventricular internal cavity size was mildly dilated.  2. Right ventricular systolic function is low normal. The right ventricular size is normal. There is mildly elevated pulmonary artery systolic pressure.  3. The mitral valve is normal in structure. Mild mitral valve regurgitation.  4. The aortic valve is tricuspid. Aortic valve regurgitation is not visualized. Aortic valve sclerosis is present, with no evidence of aortic valve stenosis.  5. The inferior vena cava is dilated in size with <50% respiratory variability, suggesting right atrial pressure of 15 mmHg. FINDINGS  Left Ventricle: Left ventricular ejection fraction, by estimation, is 40 to 45%. The left ventricle has mildly decreased function. The left ventricle demonstrates global hypokinesis. The left ventricular internal cavity size was mildly dilated. There is  no left ventricular hypertrophy.  Right Ventricle: The right ventricular size is normal. Right vetricular wall thickness was not assessed. Right ventricular systolic function is low normal. There is mildly elevated pulmonary artery systolic pressure. The tricuspid regurgitant velocity is  2.42 m/s, and with an assumed right atrial pressure of 15 mmHg, the estimated right ventricular systolic pressure is 25.3 mmHg. Left Atrium: Left atrial size was normal in size. Right Atrium: Right atrial size was normal in size. Pericardium: There is no evidence of pericardial effusion. Mitral Valve: The mitral valve is normal in structure. Mild mitral valve regurgitation. MV peak gradient, 3.8 mmHg. The mean mitral valve gradient is 2.0 mmHg. Tricuspid Valve: The tricuspid valve is normal in structure. Tricuspid valve regurgitation is mild. Aortic Valve: The aortic valve is tricuspid. Aortic valve regurgitation is not visualized. Aortic valve sclerosis is present, with no evidence of aortic valve stenosis. Aortic valve mean gradient measures 4.0 mmHg. Aortic valve peak gradient measures 6.9  mmHg. Aortic valve area, by VTI measures 2.16 cm. Pulmonic Valve: The pulmonic valve was grossly normal. Pulmonic valve regurgitation is not visualized. Aorta: The aortic root is normal in size and structure. Venous: The inferior vena cava is dilated in size with less  than 50% respiratory variability, suggesting right atrial pressure of 15 mmHg. IAS/Shunts: No atrial level shunt detected by color flow Doppler.  LEFT VENTRICLE PLAX 2D LVIDd:         5.20 cm     Diastology LVIDs:         3.65 cm     LV e' medial:    7.94 cm/s LV PW:         0.80 cm     LV E/e' medial:  8.7 LV IVS:        1.00 cm     LV e' lateral:   14.90 cm/s LVOT diam:     2.00 cm     LV E/e' lateral: 4.6 LV SV:         57 LV SV Index:   27 LVOT Area:     3.14 cm  LV Volumes (MOD) LV vol d, MOD A2C: 94.9 ml LV vol d, MOD A4C: 81.3 ml LV vol s, MOD A2C: 48.9 ml LV vol s, MOD A4C: 42.9 ml LV SV MOD A2C:      46.0 ml LV SV MOD A4C:     81.3 ml LV SV MOD BP:      42.0 ml RIGHT VENTRICLE RV Basal diam:  3.55 cm RV Mid diam:    3.70 cm RV S prime:     12.90 cm/s TAPSE (M-mode): 2.4 cm LEFT ATRIUM             Index        RIGHT ATRIUM           Index LA diam:        4.30 cm 2.06 cm/m   RA Area:     15.70 cm LA Vol (A2C):   53.4 ml 25.62 ml/m  RA Volume:   44.80 ml  21.49 ml/m LA Vol (A4C):   55.1 ml 26.44 ml/m LA Biplane Vol: 58.7 ml 28.16 ml/m  AORTIC VALVE                    PULMONIC VALVE AV Area (Vmax):    2.27 cm     PV Vmax:       0.66 m/s AV Area (Vmean):   1.99 cm     PV Peak grad:  1.7 mmHg AV Area (VTI):     2.16 cm AV Vmax:           131.00 cm/s AV Vmean:          90.500 cm/s AV VTI:            0.262 m AV Peak Grad:      6.9 mmHg AV Mean Grad:      4.0 mmHg LVOT Vmax:         94.60 cm/s LVOT Vmean:        57.300 cm/s LVOT VTI:          0.180 m LVOT/AV VTI ratio: 0.69  AORTA Ao Root diam: 2.70 cm MITRAL VALVE               TRICUSPID VALVE MV Area (PHT): 4.17 cm    TR Peak grad:   23.4 mmHg MV Area VTI:   2.46 cm    TR Vmax:        242.00 cm/s MV Peak grad:  3.8 mmHg MV Mean grad:  2.0 mmHg    SHUNTS MV Vmax:       0.98 m/s    Systemic  VTI:  0.18 m MV Vmean:      61.6 cm/s   Systemic Diam: 2.00 cm MV Decel Time: 182 msec MV E velocity: 69.00 cm/s MV A velocity: 77.60 cm/s MV E/A ratio:  0.89 Dorris Carnes MD Electronically signed by Dorris Carnes MD Signature Date/Time: 04/27/2022/10:03:09 PM    Final      Assessment and Plan:   Heart Failure Reduced Ejection Fraction  HLD with DM Morbid Obesity Ecoli UTI PNA and Severe Sepsis - Will eventually need ischemic work up given risk factors - ( her vomiting with asa may make this challenging) - NYHA class IV, Stage C, hypervolemic, etiology may be from sepsis  - Diuretic regimen: Diuresing well on lasix 40 IV BID - Discussed the importance of fluid restriction of < 2 L, salt restriction, and checking daily weights  - goal would be to discharge on at  least losartan 25 mg PO daily and metoprolol succinate 25 mg PO daily - NIBP low today; asymptomatic, but would not add GDMT today due to BP and need to diuresis - SGLT2i as outpatient (due to E coli in urine, DM, and sepsis)     Risk Assessment/Risk Scores:      New York Heart Association (NYHA) Functional Class NYHA Class IV      For questions or updates, please contact [facility] Please consult [email or web address] for contact info under     Signed, Werner Lean, MD  04/28/2022 10:19 AM

## 2022-04-29 ENCOUNTER — Inpatient Hospital Stay (HOSPITAL_COMMUNITY): Payer: PRIVATE HEALTH INSURANCE

## 2022-04-29 DIAGNOSIS — R1319 Other dysphagia: Secondary | ICD-10-CM | POA: Diagnosis not present

## 2022-04-29 DIAGNOSIS — K529 Noninfective gastroenteritis and colitis, unspecified: Secondary | ICD-10-CM | POA: Diagnosis not present

## 2022-04-29 DIAGNOSIS — I5021 Acute systolic (congestive) heart failure: Secondary | ICD-10-CM | POA: Diagnosis not present

## 2022-04-29 DIAGNOSIS — J9601 Acute respiratory failure with hypoxia: Secondary | ICD-10-CM | POA: Diagnosis not present

## 2022-04-29 LAB — BASIC METABOLIC PANEL
Anion gap: 10 (ref 5–15)
BUN: 15 mg/dL (ref 6–20)
CO2: 36 mmol/L — ABNORMAL HIGH (ref 22–32)
Calcium: 7.9 mg/dL — ABNORMAL LOW (ref 8.9–10.3)
Chloride: 95 mmol/L — ABNORMAL LOW (ref 98–111)
Creatinine, Ser: 0.38 mg/dL — ABNORMAL LOW (ref 0.44–1.00)
GFR, Estimated: >60 mL/min (ref >60)
Glucose, Bld: 184 mg/dL — ABNORMAL HIGH (ref 70–99)
Potassium: 2.9 mmol/L — ABNORMAL LOW (ref 3.5–5.1)
Sodium: 141 mmol/L (ref 135–145)

## 2022-04-29 LAB — CBC
HCT: 37.9 % (ref 36.0–46.0)
Hemoglobin: 12.8 g/dL (ref 12.0–15.0)
MCH: 33.6 pg (ref 26.0–34.0)
MCHC: 33.8 g/dL (ref 30.0–36.0)
MCV: 99.5 fL (ref 80.0–100.0)
Platelets: 117 10*3/uL — ABNORMAL LOW (ref 150–400)
RBC: 3.81 MIL/uL — ABNORMAL LOW (ref 3.87–5.11)
RDW: 13.4 % (ref 11.5–15.5)
WBC: 9.5 10*3/uL (ref 4.0–10.5)
nRBC: 0 % (ref 0.0–0.2)

## 2022-04-29 LAB — GLUCOSE, CAPILLARY
Glucose-Capillary: 204 mg/dL — ABNORMAL HIGH (ref 70–99)
Glucose-Capillary: 268 mg/dL — ABNORMAL HIGH (ref 70–99)
Glucose-Capillary: 211 mg/dL — ABNORMAL HIGH (ref 70–99)
Glucose-Capillary: 107 mg/dL — ABNORMAL HIGH (ref 70–99)

## 2022-04-29 LAB — CULTURE, BLOOD (ROUTINE X 2)
Culture: NO GROWTH
Culture: NO GROWTH
Special Requests: ADEQUATE

## 2022-04-29 LAB — HEPATIC FUNCTION PANEL
ALT: 72 U/L — ABNORMAL HIGH (ref 0–44)
AST: 42 U/L — ABNORMAL HIGH (ref 15–41)
Albumin: 2 g/dL — ABNORMAL LOW (ref 3.5–5.0)
Alkaline Phosphatase: 340 U/L — ABNORMAL HIGH (ref 38–126)
Bilirubin, Direct: 0.2 mg/dL (ref 0.0–0.2)
Indirect Bilirubin: 0.4 mg/dL (ref 0.3–0.9)
Total Bilirubin: 0.6 mg/dL (ref 0.3–1.2)
Total Protein: 5 g/dL — ABNORMAL LOW (ref 6.5–8.1)

## 2022-04-29 LAB — D-DIMER, QUANTITATIVE: D-Dimer, Quant: 2.94 ug/mL-FEU — ABNORMAL HIGH (ref 0.00–0.50)

## 2022-04-29 LAB — PROCALCITONIN: Procalcitonin: 1.31 ng/mL

## 2022-04-29 LAB — MAGNESIUM: Magnesium: 1.9 mg/dL (ref 1.7–2.4)

## 2022-04-29 LAB — BRAIN NATRIURETIC PEPTIDE: B Natriuretic Peptide: 113 pg/mL — ABNORMAL HIGH (ref 0.0–100.0)

## 2022-04-29 MED ORDER — INSULIN ASPART 100 UNIT/ML IJ SOLN
0.0000 [IU] | Freq: Three times a day (TID) | INTRAMUSCULAR | Status: DC
  Administered 2022-04-29: 5 [IU] via SUBCUTANEOUS
  Administered 2022-04-30 (×3): 3 [IU] via SUBCUTANEOUS
  Administered 2022-05-01: 2 [IU] via SUBCUTANEOUS
  Administered 2022-05-01: 3 [IU] via SUBCUTANEOUS
  Administered 2022-05-01 – 2022-05-02 (×2): 8 [IU] via SUBCUTANEOUS
  Administered 2022-05-02: 11 [IU] via SUBCUTANEOUS
  Administered 2022-05-02: 3 [IU] via SUBCUTANEOUS
  Administered 2022-05-03: 8 [IU] via SUBCUTANEOUS
  Administered 2022-05-03 – 2022-05-04 (×3): 3 [IU] via SUBCUTANEOUS

## 2022-04-29 MED ORDER — POTASSIUM CHLORIDE CRYS ER 20 MEQ PO TBCR
40.0000 meq | EXTENDED_RELEASE_TABLET | Freq: Every day | ORAL | Status: DC
  Administered 2022-04-30 – 2022-05-04 (×5): 40 meq via ORAL
  Filled 2022-04-29 (×5): qty 2

## 2022-04-29 MED ORDER — POTASSIUM CHLORIDE CRYS ER 20 MEQ PO TBCR
40.0000 meq | EXTENDED_RELEASE_TABLET | Freq: Once | ORAL | Status: AC
Start: 2022-04-29 — End: 2022-04-29
  Administered 2022-04-29: 40 meq via ORAL
  Filled 2022-04-29: qty 2

## 2022-04-29 MED ORDER — INSULIN ASPART 100 UNIT/ML IJ SOLN
0.0000 [IU] | Freq: Every day | INTRAMUSCULAR | Status: DC
  Administered 2022-04-29 – 2022-05-02 (×3): 2 [IU] via SUBCUTANEOUS

## 2022-04-29 MED ORDER — INSULIN DETEMIR 100 UNIT/ML ~~LOC~~ SOLN
15.0000 [IU] | Freq: Every day | SUBCUTANEOUS | Status: DC
  Administered 2022-04-30 – 2022-05-04 (×5): 15 [IU] via SUBCUTANEOUS
  Filled 2022-04-29 (×7): qty 0.15

## 2022-04-29 MED ORDER — INSULIN ASPART 100 UNIT/ML IJ SOLN
3.0000 [IU] | Freq: Three times a day (TID) | INTRAMUSCULAR | Status: DC
  Administered 2022-04-29 – 2022-05-02 (×9): 3 [IU] via SUBCUTANEOUS

## 2022-04-29 MED ORDER — PIPERACILLIN-TAZOBACTAM 3.375 G IVPB
3.3750 g | Freq: Three times a day (TID) | INTRAVENOUS | Status: AC
  Administered 2022-04-29 – 2022-05-01 (×8): 3.375 g via INTRAVENOUS
  Filled 2022-04-29 (×8): qty 50

## 2022-04-29 NOTE — Progress Notes (Signed)
Subjective:  Patient states she is still coughing up phlegm.  She gets short of breath with minimal activities such as turning in bed.  She is not having any difficulty with pured diet.  She does not like this diet.  She is also drinking Glucerna.  Current Medications:  Current Facility-Administered Medications:    acetaminophen (TYLENOL) tablet 650 mg, 650 mg, Oral, Q6H PRN, 650 mg at 04/28/22 1916 **OR** acetaminophen (TYLENOL) suppository 650 mg, 650 mg, Rectal, Q6H PRN, Barton Dubois, MD   budesonide (PULMICORT) nebulizer solution 0.5 mg, 0.5 mg, Nebulization, BID, Barton Dubois, MD, 0.5 mg at 04/29/22 5625   buPROPion (WELLBUTRIN XL) 24 hr tablet 150 mg, 150 mg, Oral, Daily, Barton Dubois, MD, 150 mg at 04/29/22 1027   chlorhexidine (PERIDEX) 0.12 % solution 15 mL, 15 mL, Mouth Rinse, BID, Barton Dubois, MD, 15 mL at 04/29/22 1028   Chlorhexidine Gluconate Cloth 2 % PADS 6 each, 6 each, Topical, Daily, Barton Dubois, MD, 6 each at 04/29/22 1205   cholestyramine light (PREVALITE) packet 4 g, 4 g, Oral, Daily, Barton Dubois, MD   dextromethorphan-guaiFENesin ([place]den DM) 30-600 MG per 12 hr tablet 1 tablet, 1 tablet, Oral, BID, Barton Dubois, MD, 1 tablet at 04/29/22 1029   escitalopram (LEXAPRO) tablet 10 mg, 10 mg, Oral, Daily, Barton Dubois, MD, 10 mg at 04/29/22 1028   furosemide (LASIX) injection 40 mg, 40 mg, Intravenous, BID, Tat, David, MD, 40 mg at 04/29/22 0755   heparin injection 5,000 Units, 5,000 Units, Subcutaneous, Q8H, Barton Dubois, MD, 5,000 Units at 04/29/22 6389   insulin aspart (novoLOG) injection 0-9 Units, 0-9 Units, Subcutaneous, TID WC, Tat, Shanon Brow, MD, 5 Units at 04/29/22 1205   insulin detemir (LEVEMIR) injection 10 Units, 10 Units, Subcutaneous, Daily, Tat, David, MD, 10 Units at 04/28/22 0938   ipratropium (ATROVENT) nebulizer solution 0.5 mg, 0.5 mg, Nebulization, Q6H, Barton Dubois, MD, 0.5 mg at 04/29/22 0848   levalbuterol (XOPENEX) nebulizer  solution 0.63 mg, 0.63 mg, Nebulization, Q6H, Barton Dubois, MD, 0.63 mg at 04/29/22 0848   MEDLINE mouth rinse, 15 mL, Mouth Rinse, q12n4p, Barton Dubois, MD, 15 mL at 04/29/22 1203   ondansetron (ZOFRAN) tablet 4 mg, 4 mg, Oral, Q6H PRN **OR** ondansetron (ZOFRAN) injection 4 mg, 4 mg, Intravenous, Q6H PRN, Barton Dubois, MD, 4 mg at 04/26/22 1811   pantoprazole (PROTONIX) EC tablet 40 mg, 40 mg, Oral, Daily, Barton Dubois, MD, 40 mg at 04/29/22 1028   potassium chloride SA (KLOR-CON M) CR tablet 20 mEq, 20 mEq, Oral, Daily, Tat, David, MD, 20 mEq at 04/29/22 1027   rosuvastatin (CRESTOR) tablet 5 mg, 5 mg, Oral, Daily, Barton Dubois, MD, 5 mg at 04/29/22 1028   saccharomyces boulardii (FLORASTOR) capsule 250 mg, 250 mg, Oral, BID, Barton Dubois, MD, 250 mg at 04/29/22 1027   sodium chloride (OCEAN) 0.65 % nasal spray 1 spray, 1 spray, Each Nare, PRN, Tat, Shanon Brow, MD, 1 spray at 04/26/22 1806   SUMAtriptan (IMITREX) tablet 50 mg, 50 mg, Oral, Q2H PRN, Barton Dubois, MD, 50 mg at 04/28/22 1738   Objective: Blood pressure (!) 120/48, pulse 84, temperature (!) 97.5 F (36.4 C), temperature source Oral, resp. rate (!) 27, height _0  (1.626 m), weight 98.3 kg, last menstrual period 11/25/2019, SpO2 92 %. Patient is alert and in no acute distress. She is on nasal O2. Cardiac exam with regular rhythm normal S1 and S2.  No murmur or gallop noted. Auscultation lungs reveal rales over both lungs. Abdomen is soft  and nontender. No peripheral edema.  Labs/studies Results:      Latest Ref Rng & Units 04/27/2022    4:58 AM 04/26/2022    4:24 AM 04/25/2022    4:51 AM  CBC  WBC 4.0 - 10.5 K/uL 3.4   2.9   1.2    Hemoglobin 12.0 - 15.0 g/dL 12.5   12.2   12.9    Hematocrit 36.0 - 46.0 % 38.3   38.5   40.5    Platelets 150 - 400 K/uL 107   124   121         Latest Ref Rng & Units 04/29/2022    1:09 AM 04/28/2022    3:42 AM 04/27/2022    4:58 AM  CMP  Glucose 70 - 99 mg/dL 184   164   84     BUN 6 - 20 mg/dL _0 Creatinine 0.44 - 1.00 mg/dL 0.38   0.40   0.42    Sodium 135 - 145 mmol/L 141   141   140    Potassium 3.5 - 5.1 mmol/L 2.9   3.4   3.5    Chloride 98 - 111 mmol/L 95   100   104    CO2 22 - 32 mmol/L 36   29   27    Calcium 8.9 - 10.3 mg/dL 7.9   8.1   8.3         Latest Ref Rng & Units 04/24/2022    7:35 AM 10/20/2021   10:24 AM 02/17/2019    2:36 PM  Hepatic Function  Total Protein 6.5 - 8.1 g/dL 6.1   6.8   6.4    Albumin 3.5 - 5.0 g/dL 3.0   4.2   3.6    AST 15 - 41 U/L 81   16   17    ALT 0 - 44 U/L 45   17   14    Alk Phosphatase 38 - 126 U/L 131   114   87    Total Bilirubin 0.3 - 1.2 mg/dL 1.0   0.6   1.0      Lab Results  Component Value Date   CRP <1.0 10/20/2021      Assessment:  #1.  Esophageal stricture.  Patient has over 5-monthhistory of dysphagia and barium study confirmed distal esophageal stricture preventing passage of barium pill in the stomach.  She is on pured diet and appears to be doing well.  EGD is on hold given underlying cardio pulmonary issues.  #2.  CHF.  Patient is improving with diuretic therapy.  #3.  Lobar pneumonia.  Patient is requiring nasal O2 and becomes dyspneic with minimal physical activity.  #4.  Mildly elevated transaminases on admission.  This abnormality could be due to hepatic congestion or nonspecific in the setting of pneumonia.  She could also have a fatty liver 2.  #5.  Leukopenia and thrombocytopenia.  Need to rule out chronic liver disease.  Plan:  CBC and LFTs in AM. Continue pured diet. Esophagogastroduodenoscopy with esophageal dilation when she is deemed to be stable from medical standpoint.

## 2022-04-29 NOTE — Plan of Care (Signed)

## 2022-04-29 NOTE — Progress Notes (Signed)
PROGRESS NOTE  Kelli [name] BOF:[id number] DOB: Nov 05, 1971 DOA: 04/24/2022 PCP: Lancaster Nation, MD  Brief History:  Kelli [name] is a 51 y.o. female with medical history significant of prior history of asthma (per Kelli [name] not a big issue lately and has never been using her home inhaler), type 2 diabetes mellitus, depression, gastroesophageal reflux disease, hyperlipidemia class II obesity; who presented to the emergency department secondary to productive coughing spells, shortness of breath, general malaise and dysuria.  Kelli [name] reports symptom has been present for the last 3-4 days and worsening.  She has noticed that in the last 24 hours she having very winded with minimal activity and just feeling completely drained.  Reports productive coughing spells (yellowish/greenish mucus, no hemoptysis), expressed being exposed to her grandkids (with URI symptoms) reported experiencing chills. Kelli [name] denies chest pain, no nausea or vomiting, pain, no hematuria, no hematochezia, no melena, no focal weakness, headache or any other complaints.   In the ED chest x-ray demonstrated multilobar pneumonia affecting her left lung, she was leukopenic, tachypneic, tachycardic and hypoxic.  Lactic acid 4.2.  Cultures were taken, fluid resuscitation per sepsis protocol initiated and IV antibiotics started.  [facility] contacted to place Kelli [name] in the hospital for further evaluation and management.   Of note, COVID/influenza PCR negative.      Assessment and Plan: * Acute respiratory failure with hypoxia (HCC) -In the setting of multilobar pneumonia (left Lung) -Kelli [name] oxygen saturation in the mid 80s on room air; -need of 8 L high flow nasal cannula to maintain saturation above 92%. -now up to 12 L -finished 5 days ceftriaxone/zithromas -Kelli [name] reporting difficulty swallowing for over 2 weeks and with high concern for aspiration.   Speech therapy has seen Kelli [name] and at this moment  recommendation for GI involvement -repeat BNP -repeat PCT -IS and flutter valve 4/20--repeat CXR--personally reviewed>>increase RLL opacity  Chronic diarrhea -In the setting of IBS -Continue the use of Questran and Florastor. -Appears to be stable overall.  Hypomagnesemia replete  Acute systolic CHF (congestive heart failure) ([place]) 5/18--personally reviewed CXR--increase vascular congestion and R-opacities 5/18 Echo--EF 40-45%, low normal RV; mild MR/TR Continue lasix 40 IV bid Accurate I/Os Hold SGLT due to sepsis  Hypokalemia Replete Check mag  Lobar pneumonia ([place]) Personally reviewed CXR--left lung opacities 5/18 CXR--personally reviewed--increase vascular congestion, increase R-opacities Check PCT--4.24>>2.07 Finished 5 days ceftriaxone/azithro BNP--1360  Dysphagia - Per Kelli [name] ongoing for over 2 weeks -Affecting liquids and solids in different locations. 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 -EGD once respiratory status more stable -Dysphagia 1 diet with thin liquids and close supervision has been recommended by speech therapy pending GI evaluation.  Severe sepsis ([place]) -Meeting severe sepsis criteria at time of admission with leukopenia, fever, tachycardia, presence of lactic acidosis (lactic acid 4.2), increased respiratory rate and the presence of hypoxia as part of organ dysfunction. -Lactic acid peaked 4.2 -Kelli [name] ended requiring up to 8 L high flow>>12L -finished 5 days ceftriaxone and azithro   Depression -No suicidal ideation or hallucination -Continue the use of Lexapro and Wellbutrin.  Hyperlipidemia -Continue statins.  Uncontrolled type 2 diabetes mellitus with hyperglycemia, without long-term current use of insulin ([place]) -Kelli [name] with hyperglycemia -04/24/22 A1c 11.5 -Continue sliding scale insulin and Levemir; follow CBGs and adjust hypoglycemia regimen as needed. -Holding oral  hypoglycemic agents while inpatient. CBGs largely controlled currently      Family Communication:   mother updated at bedside 5/20  Consultants:  GI  Code Status:  FULL   DVT Prophylaxis:  [place] Heparin    Procedures: As Listed in Progress Note Above  Antibiotics: Ceftriaxone 5/15>> Azithro 5/15>>      Subjective:  Kelli [name] had some oxygen desaturations.  Denies f/c, cp, n//v/d.  Denies abd pain, f/c Objective: Vitals:   04/29/22 0850 04/29/22 0853 04/29/22 1100 04/29/22 1430  BP:      Pulse:      Resp:      Temp:   (!) 97.5 F (36.4 C)   TempSrc:   Oral   SpO2: 91% 92%  90%  Weight:      Height:        Intake/Output Summary (Last 24 hours) at 04/29/2022 1447 Last data filed at 04/29/2022 1002 Gross per 24 hour  Intake 420 ml  Output 3150 ml  Net -2730 ml   Weight change:  Exam:  General:  Kelli [name] is alert, follows commands appropriately, not in acute distress HEENT: No icterus, No thrush, No neck mass, [place]/AT Cardiovascular: RRR, S1/S2, no rubs, no gallops Respiratory: bilateral crackles. No wheeze Abdomen: Soft/+BS, non tender, non distended, no guarding Extremities: 1 + LE edema, No lymphangitis, No petechiae, No rashes, no synovitis   Data Reviewed: I have personally reviewed following labs and imaging studies Basic Metabolic Panel: Recent Labs  Lab 04/24/22 0921 04/25/22 0451 04/26/22 0424 04/27/22 0458 04/28/22 0342 04/29/22 0109  NA  --  136 139 140 141 141  K  --  3.2* 3.3* 3.5 3.4* 2.9*  CL  --  107 107 104 100 95*  CO2  --  '25 28 27 29 '$ 36*  GLUCOSE  --  91 72 84 164* 184*  BUN  --  '17 20 18 16 15  '$ CREATININE  --  0.48 0.49 0.42* 0.40* 0.38*  CALCIUM  --  8.0* 8.2* 8.3* 8.1* 7.9*  MG 1.2*  --  1.9  --  1.6* 1.9  PHOS 2.2*  --   --   --   --   --    Liver Function Tests: Recent Labs  Lab 04/24/22 0735  AST 81*  ALT 45*  ALKPHOS 131*  BILITOT 1.0  PROT 6.1*  ALBUMIN 3.0*   No results for input(s): LIPASE, AMYLASE in the last 168  hours. No results for input(s): AMMONIA in the last 168 hours. Coagulation Profile: Recent Labs  Lab 04/24/22 0757  INR 1.3*   CBC: Recent Labs  Lab 04/24/22 0735 04/24/22 0804 04/25/22 0451 04/26/22 0424 04/27/22 0458  WBC 1.8*  --  1.2* 2.9* 3.4*  NEUTROABS 1.1*  --   --   --   --   HGB 14.0 14.3 12.9 12.2 12.5  HCT 42.4 42.0 40.5 38.5 38.3  MCV 102.7*  --  104.9* 104.6* 103.5*  PLT 143*  --  121* 124* 107*   Cardiac Enzymes: No results for input(s): CKTOTAL, CKMB, CKMBINDEX, TROPONINI in the last 168 hours. BNP: Invalid input(s): POCBNP CBG: Recent Labs  Lab 04/28/22 1159 04/28/22 1524 04/28/22 2052 04/29/22 0749 04/29/22 1159  GLUCAP 238* 141* 187* 204* 268*   HbA1C: No results for input(s): HGBA1C in the last 72 hours. Urine analysis:    Component Value Date/Time   COLORURINE AMBER (A) 04/24/2022 0715   APPEARANCEUR CLEAR 04/24/2022 0715   LABSPEC 1.024 04/24/2022 0715   PHURINE 6.0 04/24/2022 0715   GLUCOSEU >=500 (A) 04/24/2022 0715   HGBUR NEGATIVE 04/24/2022 0715   BILIRUBINUR NEGATIVE 04/24/2022 0715  BILIRUBINUR n 09/11/2016 1605   KETONESUR 5 (A) 04/24/2022 0715   PROTEINUR NEGATIVE 04/24/2022 0715   UROBILINOGEN negative 09/11/2016 1605   UROBILINOGEN 0.2 12/24/2007 2234   NITRITE POSITIVE (A) 04/24/2022 0715   LEUKOCYTESUR NEGATIVE 04/24/2022 0715   Sepsis Labs: '@LABRCNTIP'$ (procalcitonin:4,lacticidven:4) ) Recent Results (from the past 240 hour(s))  Resp Panel by RT-PCR (Flu A&B, Covid) Nasopharyngeal Swab     Status: None   Collection Time: 04/24/22  7:25 AM   Specimen: Nasopharyngeal Swab; Nasopharyngeal(NP) swabs in vial transport medium  Result Value Ref Range Status   SARS Coronavirus 2 by RT PCR NEGATIVE NEGATIVE Final    Comment: (NOTE) SARS-CoV-2 target nucleic acids are NOT DETECTED.  The SARS-CoV-2 RNA is generally detectable in upper respiratory specimens during the acute phase of infection. The lowest concentration of  SARS-CoV-2 viral copies this assay can detect is 138 copies/mL. A negative result does not preclude SARS-Cov-2 infection and should not be used as the sole basis for treatment or other Kelli [name] management decisions. A negative result may occur with  improper specimen collection/handling, submission of specimen other than nasopharyngeal swab, presence of viral mutation(s) within the areas targeted by this assay, and inadequate number of viral copies(<138 copies/mL). A negative result must be combined with clinical observations, Kelli [name] history, and epidemiological information. The expected result is Negative.  Fact Sheet for Patients:  EntrepreneurPulse.com.au  Fact Sheet for Healthcare Providers:  IncredibleEmployment.be  This test is no t yet approved or cleared by the [place] FDA and  has been authorized for detection and/or diagnosis of SARS-CoV-2 by FDA under an Emergency Use Authorization (EUA). This EUA will remain  in effect (meaning this test can be used) for the duration of the COVID-19 declaration under Section 564(b)(1) of the Act, 21 U.S.C.section 360bbb-3(b)(1), unless the authorization is terminated  or revoked sooner.       Influenza A by PCR NEGATIVE NEGATIVE Final   Influenza B by PCR NEGATIVE NEGATIVE Final    Comment: (NOTE) The Xpert Xpress SARS-CoV-2/FLU/RSV plus assay is intended as an aid in the diagnosis of influenza from Nasopharyngeal swab specimens and should not be used as a sole basis for treatment. Nasal washings and aspirates are unacceptable for Xpert Xpress SARS-CoV-2/FLU/RSV testing.  Fact Sheet for Patients: EntrepreneurPulse.com.au  Fact Sheet for Healthcare Providers: IncredibleEmployment.be  This test is not yet approved or cleared by the [place] FDA and has been authorized for detection and/or diagnosis of SARS-CoV-2 by FDA under an Emergency Use  Authorization (EUA). This EUA will remain in effect (meaning this test can be used) for the duration of the COVID-19 declaration under Section 564(b)(1) of the Act, 21 U.S.C. section 360bbb-3(b)(1), unless the authorization is terminated or revoked.  Performed at [facility], [address], [place], [place] [address]   Urine Culture     Status: Abnormal   Collection Time: 04/24/22  7:33 AM   Specimen: Urine, Catheterized  Result Value Ref Range Status   Specimen Description   Final    URINE, CATHETERIZED Performed at [facility], [address] [place], [place] [address]    Special Requests   Final    NONE Performed at [facility], [address] [place], [place] [address]    Culture >=100,000 COLONIES/mL ESCHERICHIA COLI (A)  Final   Report Status 04/26/2022 FINAL  Final   Organism ID, Bacteria ESCHERICHIA COLI (A)  Final      Susceptibility   Escherichia coli - MIC*    AMPICILLIN >=32  RESISTANT Resistant     CEFAZOLIN <=4 SENSITIVE Sensitive     CEFEPIME <=0.12 SENSITIVE Sensitive     CEFTRIAXONE <=0.25 SENSITIVE Sensitive     CIPROFLOXACIN >=4 RESISTANT Resistant     GENTAMICIN <=1 SENSITIVE Sensitive     IMIPENEM <=0.25 SENSITIVE Sensitive     NITROFURANTOIN <=16 SENSITIVE Sensitive     TRIMETH/SULFA <=20 SENSITIVE Sensitive     AMPICILLIN/SULBACTAM 16 INTERMEDIATE Intermediate     PIP/TAZO <=4 SENSITIVE Sensitive     * >=100,000 COLONIES/mL ESCHERICHIA COLI  Blood Culture (routine x 2)     Status: None   Collection Time: 04/24/22  7:57 AM   Specimen: BLOOD LEFT HAND  Result Value Ref Range Status   Specimen Description BLOOD LEFT HAND  Final   Special Requests   Final    BOTTLES DRAWN AEROBIC AND ANAEROBIC Blood Culture results may not be optimal due to an inadequate volume of blood received in culture bottles   Culture   Final    NO GROWTH 5 DAYS Performed at [facility], [address], [place], [place] [address]    Report Status 04/29/2022 FINAL  Final   Blood Culture (routine x 2)     Status: None   Collection Time: 04/24/22  7:57 AM   Specimen: BLOOD RIGHT WRIST  Result Value Ref Range Status   Specimen Description BLOOD RIGHT WRIST  Final   Special Requests   Final    BOTTLES DRAWN AEROBIC AND ANAEROBIC Blood Culture adequate volume   Culture   Final    NO GROWTH 5 DAYS Performed at [facility], [address], [place], [place] [address]    Report Status 04/29/2022 FINAL  Final  MRSA Next Gen by PCR, Nasal     Status: None   Collection Time: 04/24/22 10:54 AM   Specimen: Urine, Clean Catch; Nasal Swab  Result Value Ref Range Status   MRSA by PCR Next Gen NOT DETECTED NOT DETECTED Final    Comment: (NOTE) The GeneXpert MRSA Assay (FDA approved for NASAL specimens only), is one component of a comprehensive MRSA colonization surveillance program. It is not intended to diagnose MRSA infection nor to guide or monitor treatment for MRSA infections. Test performance is not FDA approved in patients less than 40 years old. Performed at [facility], [address] [place],  [address]      Scheduled Meds:  budesonide (PULMICORT) nebulizer solution  0.5 mg Nebulization BID   buPROPion  150 mg Oral Daily   chlorhexidine  15 mL Mouth Rinse BID   Chlorhexidine Gluconate Cloth  6 each Topical Daily   cholestyramine light  4 g Oral Daily   dextromethorphan-guaiFENesin  1 tablet Oral BID   escitalopram  10 mg Oral Daily   furosemide  40 mg Intravenous BID   heparin injection (subcutaneous)  5,000 Units Subcutaneous Q8H   insulin aspart  0-15 Units Subcutaneous TID WC   insulin aspart  0-5 Units Subcutaneous QHS   insulin aspart  3 Units Subcutaneous TID WC   insulin detemir  10 Units Subcutaneous Daily   ipratropium  0.5 mg Nebulization Q6H   levalbuterol  0.63 mg Nebulization Q6H   mouth rinse  15 mL Mouth Rinse q12n4p   pantoprazole  40 mg Oral Daily   [START ON 04/30/2022] potassium chloride  40 mEq Oral Daily    rosuvastatin  5 mg Oral Daily   saccharomyces boulardii  250 mg Oral BID   Continuous Infusions:  Procedures/Studies: US  Venous Img Lower Bilateral (DVT)  Result Date: 04/27/2022 CLINICAL DATA:  Bilateral lower extremity pain and swelling EXAM: BILATERAL LOWER EXTREMITY VENOUS DOPPLER ULTRASOUND TECHNIQUE: Gray-scale sonography with graded compression, as well as color Doppler and duplex ultrasound were performed to evaluate the lower extremity deep venous systems from the level of the common femoral vein and including the common femoral, femoral, profunda femoral, popliteal and calf veins including the posterior tibial, peroneal and gastrocnemius veins when visible. The superficial great saphenous vein was also interrogated. Spectral Doppler was utilized to evaluate flow at rest and with distal augmentation maneuvers in the common femoral, femoral and popliteal veins. COMPARISON:  None Available. FINDINGS: RIGHT LOWER EXTREMITY Common Femoral Vein: No evidence of thrombus. Normal compressibility, respiratory phasicity and response to augmentation. Saphenofemoral Junction: No evidence of thrombus. Normal compressibility and flow on color Doppler imaging. Profunda Femoral Vein: No evidence of thrombus. Normal compressibility and flow on color Doppler imaging. Femoral Vein: No evidence of thrombus. Normal compressibility, respiratory phasicity and response to augmentation. Popliteal Vein: No evidence of thrombus. Normal compressibility, respiratory phasicity and response to augmentation. Calf Veins: No evidence of thrombus. Normal compressibility and flow on color Doppler imaging. Superficial Great Saphenous Vein: No evidence of thrombus. Normal compressibility. Venous Reflux:  None. Other Findings:  None. LEFT LOWER EXTREMITY Common Femoral Vein: No evidence of thrombus. Normal compressibility, respiratory phasicity and response to augmentation. Saphenofemoral Junction: No evidence of thrombus. Normal  compressibility and flow on color Doppler imaging. Profunda Femoral Vein: No evidence of thrombus. Normal compressibility and flow on color Doppler imaging. Femoral Vein: No evidence of thrombus. Normal compressibility, respiratory phasicity and response to augmentation. Popliteal Vein: No evidence of thrombus. Normal compressibility, respiratory phasicity and response to augmentation. Calf Veins: No evidence of thrombus. Normal compressibility and flow on color Doppler imaging. Superficial Great Saphenous Vein: No evidence of thrombus. Normal compressibility. Venous Reflux:  None. Other Findings:  None. IMPRESSION: No evidence of deep venous thrombosis in either lower extremity. Electronically Signed   By: Jacqulynn Cadet M.D.   On: 04/27/2022 10:33   DG CHEST PORT 1 VIEW  Result Date: 04/27/2022 CLINICAL DATA:  Follow-up lobar pneumonia. EXAM: PORTABLE CHEST 1 VIEW COMPARISON:  Chest x-ray May 15, 23. FINDINGS: Increasing left greater than right airspace opacities. Airspace opacities are confluent in the left lung and patchy in the right lung. No visible pleural effusions or pneumothorax. Cardiac silhouette is largely obscured. No displaced fracture. IMPRESSION: Increasing left greater than right airspace opacities, concerning for worsening pneumonia. These results will be called to the ordering clinician or representative by the Radiologist Assistant, and communication documented in the PACS or [employer]. Electronically Signed   By: Margaretha Sheffield M.D.   On: 04/27/2022 08:07   DG Chest Port 1 View  Result Date: 04/24/2022 CLINICAL DATA:  Short of breath EXAM: PORTABLE CHEST 1 VIEW COMPARISON:  12/14/2020 FINDINGS: New consolidative opacities in the left lung. No pleural effusion. No pneumothorax. Heart size is normal. IMPRESSION: Multifocal left-sided pneumonia. Electronically Signed   By: Macy Mis M.D.   On: 04/24/2022 07:46   ECHOCARDIOGRAM COMPLETE  Result Date: 04/27/2022     ECHOCARDIOGRAM REPORT   Kelli [name] Name:   Kelli [name] Date of Exam: 04/27/2022 Medical Rec #:  [id number]        Height:       64.0 in Accession #:    [id number]       Weight:       232.1 lb Date of Birth:  1971/01/09         BSA:          2.084 m Kelli [name] Age:    53 years         BP:           133/65 mmHg Kelli [name] Gender: F                HR:           86 bpm. Exam Location:  [facility] Procedure: 2D Echo, Cardiac Doppler and Color Doppler Indications:    CHF  History:        Kelli [name] has no prior history of Echocardiogram examinations.                 CHF; Risk Factors:Hypertension, Diabetes and Dyslipidemia.  Sonographer:    Wenda Low Referring Phys: [phone] Drew Lips IMPRESSIONS  1. Left ventricular ejection fraction, by estimation, is 40 to 45%. The left ventricle has mildly decreased function. The left ventricle demonstrates global hypokinesis. The left ventricular internal cavity size was mildly dilated.  2. Right ventricular systolic function is low normal. The right ventricular size is normal. There is mildly elevated pulmonary artery systolic pressure.  3. The mitral valve is normal in structure. Mild mitral valve regurgitation.  4. The aortic valve is tricuspid. Aortic valve regurgitation is not visualized. Aortic valve sclerosis is present, with no evidence of aortic valve stenosis.  5. The inferior vena cava is dilated in size with <50% respiratory variability, suggesting right atrial pressure of 15 mmHg. FINDINGS  Left Ventricle: Left ventricular ejection fraction, by estimation, is 40 to 45%. The left ventricle has mildly decreased function. The left ventricle demonstrates global hypokinesis. The left ventricular internal cavity size was mildly dilated. There is  no left ventricular hypertrophy. Right Ventricle: The right ventricular size is normal. Right vetricular wall thickness was not assessed. Right ventricular systolic function is low normal. There is mildly elevated pulmonary artery systolic  pressure. The tricuspid regurgitant velocity is  2.42 m/s, and with an assumed right atrial pressure of 15 mmHg, the estimated right ventricular systolic pressure is 02.5 mmHg. Left Atrium: Left atrial size was normal in size. Right Atrium: Right atrial size was normal in size. Pericardium: There is no evidence of pericardial effusion. Mitral Valve: The mitral valve is normal in structure. Mild mitral valve regurgitation. MV peak gradient, 3.8 mmHg. The mean mitral valve gradient is 2.0 mmHg. Tricuspid Valve: The tricuspid valve is normal in structure. Tricuspid valve regurgitation is mild. Aortic Valve: The aortic valve is tricuspid. Aortic valve regurgitation is not visualized. Aortic valve sclerosis is present, with no evidence of aortic valve stenosis. Aortic valve mean gradient measures 4.0 mmHg. Aortic valve peak gradient measures 6.9  mmHg. Aortic valve area, by VTI measures 2.16 cm. Pulmonic Valve: The pulmonic valve was grossly normal. Pulmonic valve regurgitation is not visualized. Aorta: The aortic root is normal in size and structure. Venous: The inferior vena cava is dilated in size with less than 50% respiratory variability, suggesting right atrial pressure of 15 mmHg. IAS/Shunts: No atrial level shunt detected by color flow Doppler.  LEFT VENTRICLE PLAX 2D LVIDd:         5.20 cm     Diastology LVIDs:         3.65 cm     LV e' medial:    7.94 cm/s LV PW:         0.80 cm     LV E/e' medial:  8.7 LV IVS:        1.00 cm     LV e' lateral:   14.90 cm/s LVOT diam:     2.00 cm     LV E/e' lateral: 4.6 LV SV:         57 LV SV Index:   27 LVOT Area:     3.14 cm  LV Volumes (MOD) LV vol d, MOD A2C: 94.9 ml LV vol d, MOD A4C: 81.3 ml LV vol s, MOD A2C: 48.9 ml LV vol s, MOD A4C: 42.9 ml LV SV MOD A2C:     46.0 ml LV SV MOD A4C:     81.3 ml LV SV MOD BP:      42.0 ml RIGHT VENTRICLE RV Basal diam:  3.55 cm RV Mid diam:    3.70 cm RV S prime:     12.90 cm/s TAPSE (M-mode): 2.4 cm LEFT ATRIUM             Index         RIGHT ATRIUM           Index LA diam:        4.30 cm 2.06 cm/m   RA Area:     15.70 cm LA Vol (A2C):   53.4 ml 25.62 ml/m  RA Volume:   44.80 ml  21.49 ml/m LA Vol (A4C):   55.1 ml 26.44 ml/m LA Biplane Vol: 58.7 ml 28.16 ml/m  AORTIC VALVE                    PULMONIC VALVE AV Area (Vmax):    2.27 cm     PV Vmax:       0.66 m/s AV Area (Vmean):   1.99 cm     PV Peak grad:  1.7 mmHg AV Area (VTI):     2.16 cm AV Vmax:           131.00 cm/s AV Vmean:          90.500 cm/s AV VTI:            0.262 m AV Peak Grad:      6.9 mmHg AV Mean Grad:      4.0 mmHg LVOT Vmax:         94.60 cm/s LVOT Vmean:        57.300 cm/s LVOT VTI:          0.180 m LVOT/AV VTI ratio: 0.69  AORTA Ao Root diam: 2.70 cm MITRAL VALVE               TRICUSPID VALVE MV Area (PHT): 4.17 cm    TR Peak grad:   23.4 mmHg MV Area VTI:   2.46 cm    TR Vmax:        242.00 cm/s MV Peak grad:  3.8 mmHg MV Mean grad:  2.0 mmHg    SHUNTS MV Vmax:       0.98 m/s    Systemic VTI:  0.18 m MV Vmean:      61.6 cm/s   Systemic Diam: 2.00 cm MV Decel Time: 182 msec MV E velocity: 69.00 cm/s MV A velocity: 77.60 cm/s MV E/A ratio:  0.89 Dorris Carnes MD Electronically signed by Dorris Carnes MD Signature Date/Time: 04/27/2022/10:03:09 PM    Final    DG ESOPHAGUS W DOUBLE CM (HD)  Result Date: 04/25/2022 CLINICAL DATA:  Dysphagia, feels like foods are sticking in her chest and lower cervical region,  pneumonia EXAM: ESOPHOGRAM / BARIUM SWALLOW / BARIUM TABLET STUDY TECHNIQUE: Combined double contrast and single contrast examination performed using effervescent crystals, thick barium liquid, and thin barium liquid. The Kelli [name] was observed with fluoroscopy swallowing a 13 mm barium sulphate tablet. FLUOROSCOPY: Radiation Exposure Index (as provided by the fluoroscopic device): 57.0 mGy K[place] COMPARISON:  None Available. FINDINGS: Esophageal distention: Smooth narrowing at/just above gastroesophageal junction. Remainder of esophagus appears to distend normally.  Filling defects:  No filling defects 12.5 mm barium tablet: Obstructed at the GE junction and did not pass into stomach despite multiple swallows of water and thick barium. 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 No residuals. 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 Electronically Signed   By: Lavonia Dana M.D.   On: 04/25/2022 10:59    Orson Eva, DO  [facility]  If 7PM-7AM, please contact night-coverage [email or web address] Password [email or web address] 04/29/2022, 2:47 PM   LOS: 5 days

## 2022-04-30 ENCOUNTER — Inpatient Hospital Stay (HOSPITAL_COMMUNITY): Payer: PRIVATE HEALTH INSURANCE

## 2022-04-30 DIAGNOSIS — A419 Sepsis, unspecified organism: Secondary | ICD-10-CM | POA: Diagnosis not present

## 2022-04-30 DIAGNOSIS — I5021 Acute systolic (congestive) heart failure: Secondary | ICD-10-CM | POA: Diagnosis not present

## 2022-04-30 DIAGNOSIS — J9601 Acute respiratory failure with hypoxia: Secondary | ICD-10-CM | POA: Diagnosis not present

## 2022-04-30 DIAGNOSIS — J181 Lobar pneumonia, unspecified organism: Secondary | ICD-10-CM | POA: Diagnosis not present

## 2022-04-30 LAB — CBC
HCT: 36.9 % (ref 36.0–46.0)
Hemoglobin: 12.5 g/dL (ref 12.0–15.0)
MCH: 33.9 pg (ref 26.0–34.0)
MCHC: 33.9 g/dL (ref 30.0–36.0)
MCV: 100 fL (ref 80.0–100.0)
Platelets: 127 10*3/uL — ABNORMAL LOW (ref 150–400)
RBC: 3.69 MIL/uL — ABNORMAL LOW (ref 3.87–5.11)
RDW: 13.4 % (ref 11.5–15.5)
WBC: 10 10*3/uL (ref 4.0–10.5)
nRBC: 0 % (ref 0.0–0.2)

## 2022-04-30 LAB — GLUCOSE, CAPILLARY
Glucose-Capillary: 151 mg/dL — ABNORMAL HIGH (ref 70–99)
Glucose-Capillary: 195 mg/dL — ABNORMAL HIGH (ref 70–99)
Glucose-Capillary: 157 mg/dL — ABNORMAL HIGH (ref 70–99)
Glucose-Capillary: 90 mg/dL (ref 70–99)

## 2022-04-30 MED ORDER — IOHEXOL 350 MG/ML SOLN
75.0000 mL | Freq: Once | INTRAVENOUS | Status: AC | PRN
  Administered 2022-04-30: 75 mL via INTRAVENOUS

## 2022-04-30 NOTE — Plan of Care (Signed)

## 2022-04-30 NOTE — Progress Notes (Signed)
Nurse decreased oxygen from 12 to 9, Saturation holding at 93 , patient states she feels better at 0200 tx. With neb.

## 2022-04-30 NOTE — Progress Notes (Addendum)
PROGRESS NOTE  Kelli [name] KVQ:[id number] DOB: [birth year] DOA: 04/24/2022 PCP: [place], MD  Brief History:  Kelli [name] is a 51 y.o. female with medical history significant of prior history of asthma (per patient not a big issue lately and has never been using her home inhaler), type 2 diabetes mellitus, depression, gastroesophageal reflux disease, hyperlipidemia class II obesity; who presented to the emergency department secondary to productive coughing spells, shortness of breath, general malaise and dysuria.  Patient reports symptom has been present for the last 3-4 days and worsening.  She has noticed that in the last 24 hours she having very winded with minimal activity and just feeling completely drained.  Reports productive coughing spells (yellowish/greenish mucus, no hemoptysis), expressed being exposed to her grandkids (with URI symptoms) reported experiencing chills. Patient denies chest pain, no nausea or vomiting, pain, no hematuria, no hematochezia, no melena, no focal weakness, headache or any other complaints.   In the ED chest x-ray demonstrated multilobar pneumonia affecting her left lung, she was leukopenic, tachypneic, tachycardic and hypoxic.  Lactic acid 4.2.  Cultures were taken, fluid resuscitation per sepsis protocol initiated and IV antibiotics started.  TR[facility] contacted to place patient in the hospital for further evaluation and management.   Of note, COVID/influenza PCR negative.       Assessment and Plan: * Acute respiratory failure with hypoxia (HCC) -In the setting of multilobar pneumonia (left Lung) -Patient oxygen saturation in the mid 80s on room air; -need of 8 L high flow nasal cannula to maintain saturation above 92%. -now up to 12 L>>9L -finished 5 days ceftriaxone/zithromas -Patient reporting difficulty swallowing for over 2 weeks and with high concern for aspiration.   Speech therapy has seen patient and at this moment  recommendation for GI involvement -repeat BNP--113 -repeat PCT--1.31 -IS and flutter valve 4/20--repeat CXR--personally reviewed>>increase RLL opacity Obtain CTA chest  Chronic diarrhea -In the setting of IBS -Continue the use of Questran and Florastor. -Appears to be stable overall.  Hypomagnesemia replete  Acute systolic CHF (congestive heart failure) ([place]) 5/18--personally reviewed CXR--increase vascular congestion and R-opacities 5/18 Echo--EF 40-45%, low normal RV; mild MR/TR Continue lasix 40 IV bid Accurate I/Os Hold SGLT due to sepsis  Hypokalemia Replete Check mag--1.9  Lobar pneumonia (HCC) Personally reviewed CXR--left lung opacities 5/18 CXR--personally reviewed--increase vascular congestion, increase R-opacities Check PCT--4.24>>2.07>>1.31 Finished 5 days ceftriaxone/azithro BNP--1360>>113  Dysphagia - Per patient ongoing for over 2 weeks -Affecting liquids and solids in different locations. 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 -EGD once respiratory status more stable -Dysphagia 1 diet with thin liquids and close supervision has been recommended by speech therapy   Severe sepsis ([place]) -Meeting severe sepsis criteria at time of admission with leukopenia, fever, tachycardia, presence of lactic acidosis (lactic acid 4.2), increased respiratory rate and the presence of hypoxia as part of organ dysfunction. -Lactic acid peaked 4.2 -Patient ended requiring up to 8 L high flow>>12L -finished 5 days ceftriaxone and azithro   Depression -No suicidal ideation or hallucination -Continue the use of Lexapro and Wellbutrin.  Hyperlipidemia -Continue statin  Uncontrolled type 2 diabetes mellitus with hyperglycemia, without long-term current use of insulin ([place]) -Patient with hyperglycemia -04/24/22 A1c 11.5 -Continue sliding scale insulin and Levemir; follow CBGs and adjust hypoglycemia regimen as  needed. -Holding oral hypoglycemic agents while inpatient. CBGs largely controlled currently  Family Communication:   mother updated at bedside 5/20  Consultants:  GI   Code Status:  FULL    DVT Prophylaxis:  [place] Heparin      Procedures: As Listed in Progress Note Above   Antibiotics: Ceftriaxone 5/15>>5/19 Azithro 5/15>>5/19 Zosyn 5/20>>           Subjective: Patient states breathing is slowly improving but has sob with minimal exertion.  Denies cp, n/v/d, abd pain.  Has some pleuritic cp with inhalation.  No hemoptysis.  Objective: Vitals:   04/30/22 0911 04/30/22 1000 04/30/22 1116 04/30/22 1305  BP:  118/62    Pulse:  81    Resp:  (!) 26    Temp:   97.8 F (36.6 C)   TempSrc:   Oral   SpO2: 97% 93%  94%  Weight:      Height:        Intake/Output Summary (Last 24 hours) at 04/30/2022 1324 Last data filed at 04/30/2022 1230 Gross per 24 hour  Intake 1350 ml  Output 3325 ml  Net -1975 ml   Weight change: -2.772 kg Exam:  General:  Pt is alert, follows commands appropriately, not in acute distress HEENT: No icterus, No thrush, No neck mass, /AT Cardiovascular: RRR, S1/S2, no rubs, no gallops Respiratory: bilateral crackles. No wheeze Abdomen: Soft/+BS, non tender, non distended, no guarding Extremities: 1 + LE edema, No lymphangitis, No petechiae, No rashes, no synovitis   Data Reviewed: I have personally reviewed following labs and imaging studies Basic Metabolic Panel: Recent Labs  Lab 04/24/22 0921 04/25/22 0451 04/26/22 0424 04/27/22 0458 04/28/22 0342 04/29/22 0109  NA  --  136 139 140 141 141  K  --  3.2* 3.3* 3.5 3.4* 2.9*  CL  --  107 107 104 100 95*  CO2  --  '25 28 27 29 '$ 36*  GLUCOSE  --  91 72 84 164* 184*  BUN  --  '17 20 18 16 15  '$ CREATININE  --  0.48 0.49 0.42* 0.40* 0.38*  CALCIUM  --  8.0* 8.2* 8.3* 8.1* 7.9*  MG 1.2*  --  1.9  --  1.6* 1.9  PHOS 2.2*  --   --   --   --   --    Liver Function Tests: Recent Labs  Lab  04/24/22 0735 04/29/22 1616  AST 81* 42*  ALT 45* 72*  ALKPHOS 131* 340*  BILITOT 1.0 0.6  PROT 6.1* 5.0*  ALBUMIN 3.0* 2.0*   No results for input(s): LIPASE, AMYLASE in the last 168 hours. No results for input(s): AMMONIA in the last 168 hours. Coagulation Profile: Recent Labs  Lab 04/24/22 0757  INR 1.3*   CBC: Recent Labs  Lab 04/24/22 0735 04/24/22 0804 04/25/22 0451 04/26/22 0424 04/27/22 0458 04/29/22 1616 04/30/22 0418  WBC 1.8*  --  1.2* 2.9* 3.4* 9.5 10.0  NEUTROABS 1.1*  --   --   --   --   --   --   HGB 14.0   < > 12.9 12.2 12.5 12.8 12.5  HCT 42.4   < > 40.5 38.5 38.3 37.9 36.9  MCV 102.7*  --  104.9* 104.6* 103.5* 99.5 100.0  PLT 143*  --  121* 124* 107* 117* 127*   < > = values in this interval not displayed.   Cardiac Enzymes: No results for input(s): CKTOTAL, CKMB, CKMBINDEX, TROPONINI in the last 168 hours. BNP: Invalid input(s): POCBNP CBG: Recent Labs  Lab 04/29/22 1159 04/29/22 1647 04/29/22 2212 04/30/22 0751 04/30/22 1114  GLUCAP 268*  211* 107* 151* 195*   HbA1C: No results for input(s): HGBA1C in the last 72 hours. Urine analysis:    Component Value Date/Time   COLORURINE AMBER (A) 04/24/2022 0715   APPEARANCEUR CLEAR 04/24/2022 0715   LABSPEC 1.024 04/24/2022 0715   PHURINE 6.0 04/24/2022 0715   GLUCOSEU >=500 (A) 04/24/2022 0715   HGBUR NEGATIVE 04/24/2022 0715   BILIRUBINUR NEGATIVE 04/24/2022 0715   BILIRUBINUR n 09/11/2016 1605   KETONESUR 5 (A) 04/24/2022 0715   PROTEINUR NEGATIVE 04/24/2022 0715   UROBILINOGEN negative 09/11/2016 1605   UROBILINOGEN 0.2 12/24/2007 2234   NITRITE POSITIVE (A) 04/24/2022 0715   LEUKOCYTESUR NEGATIVE 04/24/2022 0715   Sepsis Labs: '@LABRCNTIP'$ (procalcitonin:4,lacticidven:4) ) Recent Results (from the past 240 hour(s))  Resp Panel by RT-PCR (Flu A&B, Covid) Nasopharyngeal Swab     Status: None   Collection Time: 04/24/22  7:25 AM   Specimen: Nasopharyngeal Swab; Nasopharyngeal(NP)  swabs in vial transport medium  Result Value Ref Range Status   SARS Coronavirus 2 by RT PCR NEGATIVE NEGATIVE Final    Comment: (NOTE) SARS-CoV-2 target nucleic acids are NOT DETECTED.  The SARS-CoV-2 RNA is generally detectable in upper respiratory specimens during the acute phase of infection. The lowest concentration of SARS-CoV-2 viral copies this assay can detect is 138 copies/mL. A negative result does not preclude SARS-Cov-2 infection and should not be used as the sole basis for treatment or other patient management decisions. A negative result may occur with  improper specimen collection/handling, submission of specimen other than nasopharyngeal swab, presence of viral mutation(s) within the areas targeted by this assay, and inadequate number of viral copies(<138 copies/mL). A negative result must be combined with clinical observations, patient history, and epidemiological information. The expected result is Negative.  Fact Sheet for Patients:  EntrepreneurPulse.com.au  Fact Sheet for Healthcare Providers:  IncredibleEmployment.be  This test is no t yet approved or cleared by the [place] FDA and  has been authorized for detection and/or diagnosis of SARS-CoV-2 by FDA under an Emergency Use Authorization (EUA). This EUA will remain  in effect (meaning this test can be used) for the duration of the COVID-19 declaration under Section 564(b)(1) of the Act, 21 U.S.C.section 360bbb-3(b)(1), unless the authorization is terminated  or revoked sooner.       Influenza A by PCR NEGATIVE NEGATIVE Final   Influenza B by PCR NEGATIVE NEGATIVE Final    Comment: (NOTE) The Xpert Xpress SARS-CoV-2/FLU/RSV plus assay is intended as an aid in the diagnosis of influenza from Nasopharyngeal swab specimens and should not be used as a sole basis for treatment. Nasal washings and aspirates are unacceptable for Xpert Xpress  SARS-CoV-2/FLU/RSV testing.  Fact Sheet for Patients: EntrepreneurPulse.com.au  Fact Sheet for Healthcare Providers: IncredibleEmployment.be  This test is not yet approved or cleared by the [place] FDA and has been authorized for detection and/or diagnosis of SARS-CoV-2 by FDA under an Emergency Use Authorization (EUA). This EUA will remain in effect (meaning this test can be used) for the duration of the COVID-19 declaration under Section 564(b)(1) of the Act, 21 U.S.C. section 360bbb-3(b)(1), unless the authorization is terminated or revoked.  Performed at [facility], [address] [place], [place] [address]   Urine Culture     Status: Abnormal   Collection Time: 04/24/22  7:33 AM   Specimen: Urine, Catheterized  Result Value Ref Range Status   Specimen Description   Final    URINE, CATHETERIZED Performed at [facility], [address], [place],  [place] [address]    Special Requests   Final    NONE Performed at [facility], [address], [place], [place] [address]    Culture >=100,000 COLONIES/mL ESCHERICHIA COLI (A)  Final   Report Status 04/26/2022 FINAL  Final   Organism ID, Bacteria ESCHERICHIA COLI (A)  Final      Susceptibility   Escherichia coli - MIC*    AMPICILLIN >=32 RESISTANT Resistant     CEFAZOLIN <=4 SENSITIVE Sensitive     CEFEPIME <=0.12 SENSITIVE Sensitive     CEFTRIAXONE <=0.25 SENSITIVE Sensitive     CIPROFLOXACIN >=4 RESISTANT Resistant     GENTAMICIN <=1 SENSITIVE Sensitive     IMIPENEM <=0.25 SENSITIVE Sensitive     NITROFURANTOIN <=16 SENSITIVE Sensitive     TRIMETH/SULFA <=20 SENSITIVE Sensitive     AMPICILLIN/SULBACTAM 16 INTERMEDIATE Intermediate     PIP/TAZO <=4 SENSITIVE Sensitive     * >=100,000 COLONIES/mL ESCHERICHIA COLI  Blood Culture (routine x 2)     Status: None   Collection Time: 04/24/22  7:57 AM   Specimen: BLOOD LEFT HAND  Result Value Ref Range Status   Specimen Description  BLOOD LEFT HAND  Final   Special Requests   Final    BOTTLES DRAWN AEROBIC AND ANAEROBIC Blood Culture results may not be optimal due to an inadequate volume of blood received in culture bottles   Culture   Final    NO GROWTH 5 DAYS Performed at [facility], [address] [place], [place] [address]    Report Status 04/29/2022 FINAL  Final  Blood Culture (routine x 2)     Status: None   Collection Time: 04/24/22  7:57 AM   Specimen: BLOOD RIGHT WRIST  Result Value Ref Range Status   Specimen Description BLOOD RIGHT WRIST  Final   Special Requests   Final    BOTTLES DRAWN AEROBIC AND ANAEROBIC Blood Culture adequate volume   Culture   Final    NO GROWTH 5 DAYS Performed at [facility], [address] [place], [place] [address]    Report Status 04/29/2022 FINAL  Final  MRSA Next Gen by PCR, Nasal     Status: None   Collection Time: 04/24/22 10:54 AM   Specimen: Urine, Clean Catch; Nasal Swab  Result Value Ref Range Status   MRSA by PCR Next Gen NOT DETECTED NOT DETECTED Final    Comment: (NOTE) The GeneXpert MRSA Assay (FDA approved for NASAL specimens only), is one component of a comprehensive MRSA colonization surveillance program. It is not intended to diagnose MRSA infection nor to guide or monitor treatment for MRSA infections. Test performance is not FDA approved in patients less than 14 years old. Performed at [facility], [address] [place],  [address]      Scheduled Meds:  budesonide (PULMICORT) nebulizer solution  0.5 mg Nebulization BID   buPROPion  150 mg Oral Daily   chlorhexidine  15 mL Mouth Rinse BID   Chlorhexidine Gluconate Cloth  6 each Topical Daily   cholestyramine light  4 g Oral Daily   dextromethorphan-guaiFENesin  1 tablet Oral BID   escitalopram  10 mg Oral Daily   furosemide  40 mg Intravenous BID   heparin injection (subcutaneous)  5,000 Units Subcutaneous Q8H   insulin aspart  0-15 Units Subcutaneous TID WC   insulin aspart  0-5  Units Subcutaneous QHS   insulin aspart  3 Units Subcutaneous TID WC   insulin detemir  15 Units Subcutaneous  Daily   ipratropium  0.5 mg Nebulization Q6H   levalbuterol  0.63 mg Nebulization Q6H   mouth rinse  15 mL Mouth Rinse q12n4p   pantoprazole  40 mg Oral Daily   potassium chloride  40 mEq Oral Daily   rosuvastatin  5 mg Oral Daily   saccharomyces boulardii  250 mg Oral BID   Continuous Infusions:  piperacillin-tazobactam (ZOSYN)  IV 3.375 g (04/30/22 1321)    Procedures/Studies: US Abdomen Complete  Result Date: 04/30/2022 CLINICAL DATA:  Abnormal LFTs.  Cholecystectomy. EXAM: ABDOMEN ULTRASOUND COMPLETE COMPARISON:  None Available. FINDINGS: Gallbladder: Surgically absent Common bile duct: Diameter: 8 mm Liver: Increased echogenicity throughout the liver. No focal mass. Portal vein is patent on color Doppler imaging with normal direction of blood flow towards the liver. IVC: No abnormality visualized. Pancreas: Visualized portion unremarkable. Spleen: Size and appearance within normal limits. Right Kidney: Length: 11.3 cm. Echogenicity within normal limits. No mass or hydronephrosis visualized. Left Kidney: Length: 12.8 cm. Echogenicity within normal limits. No mass or hydronephrosis visualized. Abdominal aorta: No aneurysm identified within visualize limits. The distal aorta was obscured by bowel gas. Other findings: None. IMPRESSION: 1. Previous cholecystectomy. 2. The common bile duct measures 8 mm which is within normal limits after cholecystectomy. 3. Probable hepatic steatosis. 4. No other significant abnormalities. Electronically Signed   By: Dorise Bullion III M.D.   On: 04/30/2022 09:28   US Venous Img Lower Bilateral (DVT)  Result Date: 04/27/2022 CLINICAL DATA:  Bilateral lower extremity pain and swelling EXAM: BILATERAL LOWER EXTREMITY VENOUS DOPPLER ULTRASOUND TECHNIQUE: Gray-scale sonography with graded compression, as well as color Doppler and duplex ultrasound were  performed to evaluate the lower extremity deep venous systems from the level of the common femoral vein and including the common femoral, femoral, profunda femoral, popliteal and calf veins including the posterior tibial, peroneal and gastrocnemius veins when visible. The superficial great saphenous vein was also interrogated. Spectral Doppler was utilized to evaluate flow at rest and with distal augmentation maneuvers in the common femoral, femoral and popliteal veins. COMPARISON:  None Available. FINDINGS: RIGHT LOWER EXTREMITY Common Femoral Vein: No evidence of thrombus. Normal compressibility, respiratory phasicity and response to augmentation. Saphenofemoral Junction: No evidence of thrombus. Normal compressibility and flow on color Doppler imaging. Profunda Femoral Vein: No evidence of thrombus. Normal compressibility and flow on color Doppler imaging. Femoral Vein: No evidence of thrombus. Normal compressibility, respiratory phasicity and response to augmentation. Popliteal Vein: No evidence of thrombus. Normal compressibility, respiratory phasicity and response to augmentation. Calf Veins: No evidence of thrombus. Normal compressibility and flow on color Doppler imaging. Superficial Great Saphenous Vein: No evidence of thrombus. Normal compressibility. Venous Reflux:  None. Other Findings:  None. LEFT LOWER EXTREMITY Common Femoral Vein: No evidence of thrombus. Normal compressibility, respiratory phasicity and response to augmentation. Saphenofemoral Junction: No evidence of thrombus. Normal compressibility and flow on color Doppler imaging. Profunda Femoral Vein: No evidence of thrombus. Normal compressibility and flow on color Doppler imaging. Femoral Vein: No evidence of thrombus. Normal compressibility, respiratory phasicity and response to augmentation. Popliteal Vein: No evidence of thrombus. Normal compressibility, respiratory phasicity and response to augmentation. Calf Veins: No evidence of  thrombus. Normal compressibility and flow on color Doppler imaging. Superficial Great Saphenous Vein: No evidence of thrombus. Normal compressibility. Venous Reflux:  None. Other Findings:  None. IMPRESSION: No evidence of deep venous thrombosis in either lower extremity. Electronically Signed   By: Jacqulynn Cadet M.D.   On: 04/27/2022 10:33  DG CHEST PORT 1 VIEW  Result Date: 04/29/2022 CLINICAL DATA:  Respiratory distress EXAM: PORTABLE CHEST 1 VIEW COMPARISON:  04/27/2022 FINDINGS: Stable cardiomediastinal contours. Extensive airspace opacity throughout the left lung with minimally improved aeration from prior. Worsening airspace consolidation in the right mid lung. Possible small left effusion. No pneumothorax. IMPRESSION: 1. Worsening airspace consolidation in the right mid lung. 2. Extensive airspace opacity throughout the left lung with slightly improved aeration compared to prior. Electronically Signed   By: Davina Poke D.O.   On: 04/29/2022 15:01   DG CHEST PORT 1 VIEW  Result Date: 04/27/2022 CLINICAL DATA:  Follow-up lobar pneumonia. EXAM: PORTABLE CHEST 1 VIEW COMPARISON:  Chest x-ray May 15, 23. FINDINGS: Increasing left greater than right airspace opacities. Airspace opacities are confluent in the left lung and patchy in the right lung. No visible pleural effusions or pneumothorax. Cardiac silhouette is largely obscured. No displaced fracture. IMPRESSION: Increasing left greater than right airspace opacities, concerning for worsening pneumonia. These results will be called to the ordering clinician or representative by the Radiologist Assistant, and communication documented in the PACS or [employer]. Electronically Signed   By: Margaretha Sheffield M.D.   On: 04/27/2022 08:07   DG Chest Port 1 View  Result Date: 04/24/2022 CLINICAL DATA:  Short of breath EXAM: PORTABLE CHEST 1 VIEW COMPARISON:  12/14/2020 FINDINGS: New consolidative opacities in the left lung. No pleural  effusion. No pneumothorax. Heart size is normal. IMPRESSION: Multifocal left-sided pneumonia. Electronically Signed   By: Macy Mis M.D.   On: 04/24/2022 07:46   ECHOCARDIOGRAM COMPLETE  Result Date: 04/27/2022    ECHOCARDIOGRAM REPORT   Patient Name:   LYNETTE TOPETE Date of Exam: 04/27/2022 Medical Rec #:  [id number]        Height:       64.0 in Accession #:    [id number]       Weight:       232.1 lb Date of Birth:  December 04, 1971         BSA:          2.084 m Patient Age:    79 years         BP:           133/65 mmHg Patient Gender: F                HR:           86 bpm. Exam Location:  [facility] Procedure: 2D Echo, Cardiac Doppler and Color Doppler Indications:    CHF  History:        Patient has no prior history of Echocardiogram examinations.                 CHF; Risk Factors:Hypertension, Diabetes and Dyslipidemia.  Sonographer:    Wenda Low Referring Phys: [phone] Amonie Wisser IMPRESSIONS  1. Left ventricular ejection fraction, by estimation, is 40 to 45%. The left ventricle has mildly decreased function. The left ventricle demonstrates global hypokinesis. The left ventricular internal cavity size was mildly dilated.  2. Right ventricular systolic function is low normal. The right ventricular size is normal. There is mildly elevated pulmonary artery systolic pressure.  3. The mitral valve is normal in structure. Mild mitral valve regurgitation.  4. The aortic valve is tricuspid. Aortic valve regurgitation is not visualized. Aortic valve sclerosis is present, with no evidence of aortic valve stenosis.  5. The inferior vena cava is dilated in size with <50% respiratory variability, suggesting right  atrial pressure of 15 mmHg. FINDINGS  Left Ventricle: Left ventricular ejection fraction, by estimation, is 40 to 45%. The left ventricle has mildly decreased function. The left ventricle demonstrates global hypokinesis. The left ventricular internal cavity size was mildly dilated. There is  no left  ventricular hypertrophy. Right Ventricle: The right ventricular size is normal. Right vetricular wall thickness was not assessed. Right ventricular systolic function is low normal. There is mildly elevated pulmonary artery systolic pressure. The tricuspid regurgitant velocity is  2.42 m/s, and with an assumed right atrial pressure of 15 mmHg, the estimated right ventricular systolic pressure is 29.9 mmHg. Left Atrium: Left atrial size was normal in size. Right Atrium: Right atrial size was normal in size. Pericardium: There is no evidence of pericardial effusion. Mitral Valve: The mitral valve is normal in structure. Mild mitral valve regurgitation. MV peak gradient, 3.8 mmHg. The mean mitral valve gradient is 2.0 mmHg. Tricuspid Valve: The tricuspid valve is normal in structure. Tricuspid valve regurgitation is mild. Aortic Valve: The aortic valve is tricuspid. Aortic valve regurgitation is not visualized. Aortic valve sclerosis is present, with no evidence of aortic valve stenosis. Aortic valve mean gradient measures 4.0 mmHg. Aortic valve peak gradient measures 6.9  mmHg. Aortic valve area, by VTI measures 2.16 cm. Pulmonic Valve: The pulmonic valve was grossly normal. Pulmonic valve regurgitation is not visualized. Aorta: The aortic root is normal in size and structure. Venous: The inferior vena cava is dilated in size with less than 50% respiratory variability, suggesting right atrial pressure of 15 mmHg. IAS/Shunts: No atrial level shunt detected by color flow Doppler.  LEFT VENTRICLE PLAX 2D LVIDd:         5.20 cm     Diastology LVIDs:         3.65 cm     LV e' medial:    7.94 cm/s LV PW:         0.80 cm     LV E/e' medial:  8.7 LV IVS:        1.00 cm     LV e' lateral:   14.90 cm/s LVOT diam:     2.00 cm     LV E/e' lateral: 4.6 LV SV:         57 LV SV Index:   27 LVOT Area:     3.14 cm  LV Volumes (MOD) LV vol d, MOD A2C: 94.9 ml LV vol d, MOD A4C: 81.3 ml LV vol s, MOD A2C: 48.9 ml LV vol s, MOD A4C:  42.9 ml LV SV MOD A2C:     46.0 ml LV SV MOD A4C:     81.3 ml LV SV MOD BP:      42.0 ml RIGHT VENTRICLE RV Basal diam:  3.55 cm RV Mid diam:    3.70 cm RV S prime:     12.90 cm/s TAPSE (M-mode): 2.4 cm LEFT ATRIUM             Index        RIGHT ATRIUM           Index LA diam:        4.30 cm 2.06 cm/m   RA Area:     15.70 cm LA Vol (A2C):   53.4 ml 25.62 ml/m  RA Volume:   44.80 ml  21.49 ml/m LA Vol (A4C):   55.1 ml 26.44 ml/m LA Biplane Vol: 58.7 ml 28.16 ml/m  AORTIC VALVE  PULMONIC VALVE AV Area (Vmax):    2.27 cm     PV Vmax:       0.66 m/s AV Area (Vmean):   1.99 cm     PV Peak grad:  1.7 mmHg AV Area (VTI):     2.16 cm AV Vmax:           131.00 cm/s AV Vmean:          90.500 cm/s AV VTI:            0.262 m AV Peak Grad:      6.9 mmHg AV Mean Grad:      4.0 mmHg LVOT Vmax:         94.60 cm/s LVOT Vmean:        57.300 cm/s LVOT VTI:          0.180 m LVOT/AV VTI ratio: 0.69  AORTA Ao Root diam: 2.70 cm MITRAL VALVE               TRICUSPID VALVE MV Area (PHT): 4.17 cm    TR Peak grad:   23.4 mmHg MV Area VTI:   2.46 cm    TR Vmax:        242.00 cm/s MV Peak grad:  3.8 mmHg MV Mean grad:  2.0 mmHg    SHUNTS MV Vmax:       0.98 m/s    Systemic VTI:  0.18 m MV Vmean:      61.6 cm/s   Systemic Diam: 2.00 cm MV Decel Time: 182 msec MV E velocity: 69.00 cm/s MV A velocity: 77.60 cm/s MV E/A ratio:  0.89 Dorris Carnes MD Electronically signed by Dorris Carnes MD Signature Date/Time: 04/27/2022/10:03:09 PM    Final    DG ESOPHAGUS W DOUBLE CM (HD)  Result Date: 04/25/2022 CLINICAL DATA:  Dysphagia, feels like foods are sticking in her chest and lower cervical region, pneumonia EXAM: ESOPHOGRAM / BARIUM SWALLOW / BARIUM TABLET STUDY TECHNIQUE: Combined double contrast and single contrast examination performed using effervescent crystals, thick barium liquid, and thin barium liquid. The patient was observed with fluoroscopy swallowing a 13 mm barium sulphate tablet. FLUOROSCOPY: Radiation Exposure  Index (as provided by the fluoroscopic device): 57.0 mGy Kerma COMPARISON:  None Available. FINDINGS: Esophageal distention: Smooth narrowing at/just above gastroesophageal junction. Remainder of esophagus appears to distend normally. Filling defects:  No filling defects 12.5 mm barium tablet: Obstructed at the GE junction and did not pass into stomach despite multiple swallows of water and thick barium. 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 No residuals. 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 Electronically Signed   By: Lavonia Dana M.D.   On: 04/25/2022 10:59    Orson Eva, DO  [facility]  If 7PM-7AM, please contact night-coverage [email or web address] Password [email or web address] 04/30/2022, 1:24 PM   LOS: 6 days

## 2022-04-30 NOTE — Progress Notes (Addendum)
Subjective:  Patient says she is trying to eat as much she can but she does not like the hospital food.  Her family is bringing her some food.  She is not having nausea vomiting or heartburn.  She states she becomes short of breath with minimal activity.  She remains with productive cough.  Current Medications:  Current Facility-Administered Medications:    acetaminophen (TYLENOL) tablet 650 mg, 650 mg, Oral, Q6H PRN, 650 mg at 04/29/22 2033 **OR** acetaminophen (TYLENOL) suppository 650 mg, 650 mg, Rectal, Q6H PRN, Barton Dubois, MD   budesonide (PULMICORT) nebulizer solution 0.5 mg, 0.5 mg, Nebulization, BID, Barton Dubois, MD, 0.5 mg at 04/30/22 0911   buPROPion (WELLBUTRIN XL) 24 hr tablet 150 mg, 150 mg, Oral, Daily, Barton Dubois, MD, 150 mg at 04/30/22 0939   chlorhexidine (PERIDEX) 0.12 % solution 15 mL, 15 mL, Mouth Rinse, BID, Barton Dubois, MD, 15 mL at 04/30/22 0940   Chlorhexidine Gluconate Cloth 2 % PADS 6 each, 6 each, Topical, Daily, Barton Dubois, MD, 6 each at 04/30/22 1000   cholestyramine light (PREVALITE) packet 4 g, 4 g, Oral, Daily, Barton Dubois, MD   dextromethorphan-guaiFENesin ([place] DM) 30-600 MG per 12 hr tablet 1 tablet, 1 tablet, Oral, BID, Barton Dubois, MD, 1 tablet at 04/30/22 0939   escitalopram (LEXAPRO) tablet 10 mg, 10 mg, Oral, Daily, Barton Dubois, MD, 10 mg at 04/30/22 7322   furosemide (LASIX) injection 40 mg, 40 mg, Intravenous, BID, Tat, David, MD, 40 mg at 04/30/22 0745   heparin injection 5,000 Units, 5,000 Units, Subcutaneous, Q8H, Barton Dubois, MD, 5,000 Units at 04/30/22 0619   insulin aspart (novoLOG) injection 0-15 Units, 0-15 Units, Subcutaneous, TID WC, Tat, Shanon Brow, MD, 3 Units at 04/30/22 1146   insulin aspart (novoLOG) injection 0-5 Units, 0-5 Units, Subcutaneous, QHS, Tat, Shanon Brow, MD, 2 Units at 04/29/22 2134   insulin aspart (novoLOG) injection 3 Units, 3 Units, Subcutaneous, TID WC, Tat, Shanon Brow, MD, 3 Units at 04/30/22 1146    insulin detemir (LEVEMIR) injection 15 Units, 15 Units, Subcutaneous, Daily, Tat, David, MD, 15 Units at 04/30/22 1147   ipratropium (ATROVENT) nebulizer solution 0.5 mg, 0.5 mg, Nebulization, Q6H, Barton Dubois, MD, 0.5 mg at 04/30/22 0911   levalbuterol (XOPENEX) nebulizer solution 0.63 mg, 0.63 mg, Nebulization, Q6H, Barton Dubois, MD, 0.63 mg at 04/30/22 0254   MEDLINE mouth rinse, 15 mL, Mouth Rinse, q12n4p, Barton Dubois, MD, 15 mL at 04/30/22 1148   ondansetron (ZOFRAN) tablet 4 mg, 4 mg, Oral, Q6H PRN **OR** ondansetron (ZOFRAN) injection 4 mg, 4 mg, Intravenous, Q6H PRN, Barton Dubois, MD, 4 mg at 04/26/22 1811   pantoprazole (PROTONIX) EC tablet 40 mg, 40 mg, Oral, Daily, Barton Dubois, MD, 40 mg at 04/30/22 0940   piperacillin-tazobactam (ZOSYN) IVPB 3.375 g, 3.375 g, Intravenous, Q8H, Tat, Shanon Brow, MD, Last Rate: 12.5 mL/hr at 04/30/22 0620, 3.375 g at 04/30/22 2706   potassium chloride SA (KLOR-CON M) CR tablet 40 mEq, 40 mEq, Oral, Daily, Tat, David, MD, 40 mEq at 04/30/22 2376   rosuvastatin (CRESTOR) tablet 5 mg, 5 mg, Oral, Daily, Barton Dubois, MD, 5 mg at 04/30/22 2831   saccharomyces boulardii (FLORASTOR) capsule 250 mg, 250 mg, Oral, BID, Barton Dubois, MD, 250 mg at 04/30/22 0939   sodium chloride (OCEAN) 0.65 % nasal spray 1 spray, 1 spray, Each Nare, PRN, Orson Eva, MD, 1 spray at 04/26/22 1806   SUMAtriptan (IMITREX) tablet 50 mg, 50 mg, Oral, Q2H PRN, Barton Dubois, MD, 50 mg at 04/28/22 1738  Objective: Blood pressure 118/62, pulse 81, temperature 97.8 F (36.6 C), temperature source Oral, resp. rate (!) 26, height $RemoveBe'5\' 4"'ogMVePubu$  (1.626 m), weight 95.5 kg, last menstrual period 11/25/2019, SpO2 93 %. Patient is alert and on nasal O2. She does not appear to be in distress. Cardiac exam with regular rhythm normal S1 and S2.  No murmur or gallop noted. Auscultation lungs reveal crackles both over anterior and posterior lungs more over left lung. Abdomen is soft and  nontender with organomegaly or masses.  Labs/studies Results:      Latest Ref Rng & Units 04/30/2022    4:18 AM 04/29/2022    4:16 PM 04/27/2022    4:58 AM  CBC  WBC 4.0 - 10.5 K/uL 10.0   9.5   3.4    Hemoglobin 12.0 - 15.0 g/dL 12.5   12.8   12.5    Hematocrit 36.0 - 46.0 % 36.9   37.9   38.3    Platelets 150 - 400 K/uL 127   117   107         Latest Ref Rng & Units 04/29/2022    4:16 PM 04/29/2022    1:09 AM 04/28/2022    3:42 AM  CMP  Glucose 70 - 99 mg/dL  184   164    BUN 6 - 20 mg/dL  15   16    Creatinine 0.44 - 1.00 mg/dL  0.38   0.40    Sodium 135 - 145 mmol/L  141   141    Potassium 3.5 - 5.1 mmol/L  2.9   3.4    Chloride 98 - 111 mmol/L  95   100    CO2 22 - 32 mmol/L  36   29    Calcium 8.9 - 10.3 mg/dL  7.9   8.1    Total Protein 6.5 - 8.1 g/dL 5.0      Total Bilirubin 0.3 - 1.2 mg/dL 0.6      Alkaline Phos 38 - 126 U/L 340      AST 15 - 41 U/L 42      ALT 0 - 44 U/L 72           Latest Ref Rng & Units 04/29/2022    4:16 PM 04/24/2022    7:35 AM 10/20/2021   10:24 AM  Hepatic Function  Total Protein 6.5 - 8.1 g/dL 5.0   6.1   6.8    Albumin 3.5 - 5.0 g/dL 2.0   3.0   4.2    AST 15 - 41 U/L 42   81   16    ALT 0 - 44 U/L 72   45   17    Alk Phosphatase 38 - 126 U/L 340   131   114    Total Bilirubin 0.3 - 1.2 mg/dL 0.6   1.0   0.6    Bilirubin, Direct 0.0 - 0.2 mg/dL 0.2        Lab Results  Component Value Date   CRP <1.0 10/20/2021    Abdominal ultrasound reveals echogenic liver, bile duct measures 8 mm.  Gallbladder surgically removed.  No evidence of splenomegaly or choledocholithiasis.  Assessment:  #1.  Esophageal stricture.  She has 6 months history of dysphagia.  Esophagogram documented distal esophageal stricture.  She is not even close to be scheduled for esophagogastroduodenoscopy and dilation given cardiopulmonary issues.  #2.  CHF.  Patient has systolic heart failure.  She is on IV diuretic,  low-dose losartan and metoprolol.  #3.  Lobar  pneumonia.  Patient is requiring nasal O2 and becomes dyspneic with minimal physical activity.  #4.  Mildly elevated transaminases.  AST has decreased but ALT has increased and so has alkaline phosphatase.  Ultrasound reveals fatty liver and bile duct measuring 8 mm which is normal and postcholecystectomy state.  Elevated transaminases and alkaline phosphatase possibly multifactorial but primarily due to hepatic congestion and pneumonia.  #5.  Leukopenia and thrombocytopenia.  It remains to be seen if she has underlying cirrhosis.  Her risk factor is obesity.  Plan:  Will continue to monitor LFTs. Esophagogastroduodenoscopy with dilation will be scheduled when she is stable from cardiopulmonary standpoint.

## 2022-05-01 DIAGNOSIS — J9601 Acute respiratory failure with hypoxia: Secondary | ICD-10-CM | POA: Diagnosis not present

## 2022-05-01 DIAGNOSIS — J181 Lobar pneumonia, unspecified organism: Secondary | ICD-10-CM | POA: Diagnosis not present

## 2022-05-01 DIAGNOSIS — I5021 Acute systolic (congestive) heart failure: Secondary | ICD-10-CM | POA: Diagnosis not present

## 2022-05-01 DIAGNOSIS — R1319 Other dysphagia: Secondary | ICD-10-CM | POA: Diagnosis not present

## 2022-05-01 DIAGNOSIS — A419 Sepsis, unspecified organism: Secondary | ICD-10-CM | POA: Diagnosis not present

## 2022-05-01 DIAGNOSIS — K529 Noninfective gastroenteritis and colitis, unspecified: Secondary | ICD-10-CM | POA: Diagnosis not present

## 2022-05-01 LAB — HEPATIC FUNCTION PANEL
ALT: 43 U/L (ref 0–44)
AST: 26 U/L (ref 15–41)
Albumin: 1.9 g/dL — ABNORMAL LOW (ref 3.5–5.0)
Alkaline Phosphatase: 262 U/L — ABNORMAL HIGH (ref 38–126)
Bilirubin, Direct: 0.4 mg/dL — ABNORMAL HIGH (ref 0.0–0.2)
Indirect Bilirubin: 0.5 mg/dL (ref 0.3–0.9)
Total Bilirubin: 0.9 mg/dL (ref 0.3–1.2)
Total Protein: 5.2 g/dL — ABNORMAL LOW (ref 6.5–8.1)

## 2022-05-01 LAB — BASIC METABOLIC PANEL
Anion gap: 14 (ref 5–15)
BUN: 17 mg/dL (ref 6–20)
CO2: 31 mmol/L (ref 22–32)
Calcium: 7.7 mg/dL — ABNORMAL LOW (ref 8.9–10.3)
Chloride: 91 mmol/L — ABNORMAL LOW (ref 98–111)
Creatinine, Ser: 0.4 mg/dL — ABNORMAL LOW (ref 0.44–1.00)
GFR, Estimated: >60 mL/min (ref >60)
Glucose, Bld: 126 mg/dL — ABNORMAL HIGH (ref 70–99)
Potassium: 3.3 mmol/L — ABNORMAL LOW (ref 3.5–5.1)
Sodium: 136 mmol/L (ref 135–145)

## 2022-05-01 LAB — GLUCOSE, CAPILLARY
Glucose-Capillary: 151 mg/dL — ABNORMAL HIGH (ref 70–99)
Glucose-Capillary: 259 mg/dL — ABNORMAL HIGH (ref 70–99)
Glucose-Capillary: 121 mg/dL — ABNORMAL HIGH (ref 70–99)
Glucose-Capillary: 223 mg/dL — ABNORMAL HIGH (ref 70–99)

## 2022-05-01 LAB — MAGNESIUM: Magnesium: 1.9 mg/dL (ref 1.7–2.4)

## 2022-05-01 MED ORDER — BISACODYL 10 MG RE SUPP
10.0000 mg | Freq: Once | RECTAL | Status: AC
  Administered 2022-05-01: 10 mg via RECTAL
  Filled 2022-05-01: qty 1

## 2022-05-01 MED ORDER — POLYETHYLENE GLYCOL 3350 17 G PO PACK
17.0000 g | PACK | Freq: Every day | ORAL | Status: DC
  Administered 2022-05-01 – 2022-05-03 (×3): 17 g via ORAL
  Filled 2022-05-01 (×3): qty 1

## 2022-05-01 MED ORDER — POTASSIUM CHLORIDE CRYS ER 20 MEQ PO TBCR
40.0000 meq | EXTENDED_RELEASE_TABLET | Freq: Once | ORAL | Status: AC
  Administered 2022-05-01: 40 meq via ORAL
  Filled 2022-05-01: qty 2

## 2022-05-01 NOTE — Progress Notes (Signed)
PROGRESS NOTE  Kelli [name] GTX:[id number] DOB: [birth year] DOA: 04/24/2022 PCP: Huron Nation, MD  Brief History:  Kelli [name] is a 51 y.o. female with medical history significant of prior history of asthma (per patient not a big issue lately and has never been using her home inhaler), type 2 diabetes mellitus, depression, gastroesophageal reflux disease, hyperlipidemia class II obesity; who presented to the emergency department secondary to productive coughing spells, shortness of breath, general malaise and dysuria.  Patient reports symptom has been present for the last 3-4 days and worsening.  She has noticed that in the last 24 hours she having very winded with minimal activity and just feeling completely drained.  Reports productive coughing spells (yellowish/greenish mucus, no hemoptysis), expressed being exposed to her grandkids (with URI symptoms) reported experiencing chills. Patient denies chest pain, no nausea or vomiting, pain, no hematuria, no hematochezia, no melena, no focal weakness, headache or any other complaints.   In the ED chest x-ray demonstrated multilobar pneumonia affecting her left lung, she was leukopenic, tachypneic, tachycardic and hypoxic.  Lactic acid 4.2.  Cultures were taken, fluid resuscitation per sepsis protocol initiated and IV antibiotics started.  TRH contacted to place patient in the hospital for further evaluation and management.   Of note, COVID/influenza PCR negative.    Assessment and Plan: * Acute respiratory failure with hypoxia (HCC) -In the setting of multilobar pneumonia (left Lung) -Patient oxygen saturation in the mid 80s on room air; -need of 8 L high flow nasal cannula to maintain saturation above 92%. -now up to 12 L>>9L>>3L -finished 5 days ceftriaxone/zithromas -Patient reporting difficulty swallowing for over 2 weeks and with high concern for aspiration.   Speech therapy has seen patient and at this moment  recommendation for GI involvement -repeat BNP--113 -repeat PCT--1.31 -IS and flutter valve 4/20--repeat CXR--personally reviewed>>increase RLL opacity 5/21 CTA chest--No PE--Diffuse patchy and confluent ground-glass opacities throughout both lungs  Severe sepsis ([place]) -Meeting severe sepsis criteria at time of admission with leukopenia, fever, tachycardia, presence of lactic acidosis (lactic acid 4.2), increased respiratory rate and the presence of hypoxia as part of organ dysfunction. -Lactic acid peaked 4.2 -Patient ended requiring up to 8 L high flow>>12L>>3L -finished 5 days ceftriaxone and azithro   Lobar pneumonia ([place]) Personally reviewed CXR--left lung opacities 5/18 CXR--personally reviewed--increase vascular congestion, increase R-opacities Check PCT--4.24>>2.07>>1.31 Finished 5 days ceftriaxone/azithro YQM--2500>>370  Acute systolic CHF (congestive heart failure) ([place]) 5/18--personally reviewed CXR--increase vascular congestion and R-opacities 5/18 Echo--EF 40-45%, low normal RV; mild MR/TR Continue lasix 40 IV bid Accurate I/Os--incomplete Hold SGLT due to sepsis  Chronic diarrhea -In the setting of IBS -Continue the use of Questran and Florastor. -Appears to be stable overall.  Hypomagnesemia replete  Hypokalemia Replete Check mag--1.9  Dysphagia - Per patient ongoing for over 2 weeks -Affecting liquids and solids in different locations. 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 -EGD once respiratory status more stable -Dysphagia 1 diet with thin liquids and close supervision has been recommended by speech therapy   Depression -No suicidal ideation or hallucination -Continue Lexapro and Wellbutrin.  Hyperlipidemia -Continue statin  Uncontrolled type 2 diabetes mellitus with hyperglycemia, without long-term current use of insulin ([place]) -Patient with hyperglycemia -04/24/22 A1c 11.5 -Continue  sliding scale insulin and Levemir; follow CBGs and adjust hypoglycemia regimen as needed. -Holding oral hypoglycemic agents while inpatient. CBGs largely controlled currently Add novolog 5 units TIW   Family Communication:  mother updated at bedside 5/22   Consultants:  GI   Code Status:  FULL    DVT Prophylaxis:  [place] Heparin      Procedures: As Listed in Progress Note Above   Antibiotics: Ceftriaxone 5/15>>5/19 Azithro 5/15>>5/19 Zosyn 5/20>>5/22              Subjective: Patient is breathing better.  Denies f/c, cp.  No n/v/d, abd pain  Objective: Vitals:   05/01/22 0906 05/01/22 1042 05/01/22 1100 05/01/22 1200  BP: 131/62 125/66  (!) 123/58  Pulse: 94 84  87  Resp: (!) 26 20  (!) 24  Temp:   (!) 97.5 F (36.4 C)   TempSrc:   Oral   SpO2: 94% 97%  96%  Weight:      Height:        Intake/Output Summary (Last 24 hours) at 05/01/2022 1315 Last data filed at 05/01/2022 1125 Gross per 24 hour  Intake 959.42 ml  Output 2425 ml  Net -1465.58 ml   Weight change: 0.172 kg Exam:  General:  Pt is alert, follows commands appropriately, not in acute distress HEENT: No icterus, No thrush, No neck mass, [place]/AT Cardiovascular: RRR, S1/S2, no rubs, no gallops Respiratory: bilateral crackles. No wheeze Abdomen: Soft/+BS, non tender, non distended, no guarding Extremities: 1 + LE edema, No lymphangitis, No petechiae, No rashes, no synovitis   Data Reviewed: I have personally reviewed following labs and imaging studies Basic Metabolic Panel: Recent Labs  Lab 04/26/22 0424 04/27/22 0458 04/28/22 0342 04/29/22 0109 05/01/22 0426  NA 139 140 141 141 136  K 3.3* 3.5 3.4* 2.9* 3.3*  CL 107 104 100 95* 91*  CO2 '28 27 29 '$ 36* 31  GLUCOSE 72 84 164* 184* 126*  BUN '20 18 16 15 17  '$ CREATININE 0.49 0.42* 0.40* 0.38* 0.40*  CALCIUM 8.2* 8.3* 8.1* 7.9* 7.7*  MG 1.9  --  1.6* 1.9 1.9   Liver Function Tests: Recent Labs  Lab 04/29/22 1616 05/01/22 0426  AST 42*  26  ALT 72* 43  ALKPHOS 340* 262*  BILITOT 0.6 0.9  PROT 5.0* 5.2*  ALBUMIN 2.0* 1.9*   No results for input(s): LIPASE, AMYLASE in the last 168 hours. No results for input(s): AMMONIA in the last 168 hours. Coagulation Profile: No results for input(s): INR, PROTIME in the last 168 hours. CBC: Recent Labs  Lab 04/25/22 0451 04/26/22 0424 04/27/22 0458 04/29/22 1616 04/30/22 0418  WBC 1.2* 2.9* 3.4* 9.5 10.0  HGB 12.9 12.2 12.5 12.8 12.5  HCT 40.5 38.5 38.3 37.9 36.9  MCV 104.9* 104.6* 103.5* 99.5 100.0  PLT 121* 124* 107* 117* 127*   Cardiac Enzymes: No results for input(s): CKTOTAL, CKMB, CKMBINDEX, TROPONINI in the last 168 hours. BNP: Invalid input(s): POCBNP CBG: Recent Labs  Lab 04/30/22 1114 04/30/22 1604 04/30/22 2139 05/01/22 0735 05/01/22 1109  GLUCAP 195* 157* 90 151* 259*   HbA1C: No results for input(s): HGBA1C in the last 72 hours. Urine analysis:    Component Value Date/Time   COLORURINE AMBER (A) 04/24/2022 0715   APPEARANCEUR CLEAR 04/24/2022 0715   LABSPEC 1.024 04/24/2022 0715   PHURINE 6.0 04/24/2022 0715   GLUCOSEU >=500 (A) 04/24/2022 0715   HGBUR NEGATIVE 04/24/2022 0715   BILIRUBINUR NEGATIVE 04/24/2022 0715   BILIRUBINUR n 09/11/2016 1605   KETONESUR 5 (A) 04/24/2022 0715   PROTEINUR NEGATIVE 04/24/2022 0715   UROBILINOGEN negative 09/11/2016 1605   UROBILINOGEN 0.2 12/24/2007 2234   NITRITE POSITIVE (A) 04/24/2022 0715  LEUKOCYTESUR NEGATIVE 04/24/2022 0715   Sepsis Labs: '@LABRCNTIP'$ (procalcitonin:4,lacticidven:4) ) Recent Results (from the past 240 hour(s))  Resp Panel by RT-PCR (Flu A&B, Covid) Nasopharyngeal Swab     Status: None   Collection Time: 04/24/22  7:25 AM   Specimen: Nasopharyngeal Swab; Nasopharyngeal(NP) swabs in vial transport medium  Result Value Ref Range Status   SARS Coronavirus 2 by RT PCR NEGATIVE NEGATIVE Final    Comment: (NOTE) SARS-CoV-2 target nucleic acids are NOT DETECTED.  The SARS-CoV-2 RNA  is generally detectable in upper respiratory specimens during the acute phase of infection. The lowest concentration of SARS-CoV-2 viral copies this assay can detect is 138 copies/mL. A negative result does not preclude SARS-Cov-2 infection and should not be used as the sole basis for treatment or other patient management decisions. A negative result may occur with  improper specimen collection/handling, submission of specimen other than nasopharyngeal swab, presence of viral mutation(s) within the areas targeted by this assay, and inadequate number of viral copies(<138 copies/mL). A negative result must be combined with clinical observations, patient history, and epidemiological information. The expected result is Negative.  Fact Sheet for Patients:  EntrepreneurPulse.com.au  Fact Sheet for Healthcare Providers:  IncredibleEmployment.be  This test is no t yet approved or cleared by the [place] FDA and  has been authorized for detection and/or diagnosis of SARS-CoV-2 by FDA under an Emergency Use Authorization (EUA). This EUA will remain  in effect (meaning this test can be used) for the duration of the COVID-19 declaration under Section 564(b)(1) of the Act, 21 U.S.C.section 360bbb-3(b)(1), unless the authorization is terminated  or revoked sooner.       Influenza A by PCR NEGATIVE NEGATIVE Final   Influenza B by PCR NEGATIVE NEGATIVE Final    Comment: (NOTE) The Xpert Xpress SARS-CoV-2/FLU/RSV plus assay is intended as an aid in the diagnosis of influenza from Nasopharyngeal swab specimens and should not be used as a sole basis for treatment. Nasal washings and aspirates are unacceptable for Xpert Xpress SARS-CoV-2/FLU/RSV testing.  Fact Sheet for Patients: EntrepreneurPulse.com.au  Fact Sheet for Healthcare Providers: IncredibleEmployment.be  This test is not yet approved or cleared by the  [place] FDA and has been authorized for detection and/or diagnosis of SARS-CoV-2 by FDA under an Emergency Use Authorization (EUA). This EUA will remain in effect (meaning this test can be used) for the duration of the COVID-19 declaration under Section 564(b)(1) of the Act, 21 U.S.C. section 360bbb-3(b)(1), unless the authorization is terminated or revoked.  Performed at [facility], [address], [place], [place] [address]   Urine Culture     Status: Abnormal   Collection Time: 04/24/22  7:33 AM   Specimen: Urine, Catheterized  Result Value Ref Range Status   Specimen Description   Final    URINE, CATHETERIZED Performed at [facility], [address], [place], [place] [address]    Special Requests   Final    NONE Performed at [facility], [address] [place] [address], [place], [place] [address]    Culture >=100,000 COLONIES/mL ESCHERICHIA COLI (A)  Final   Report Status 04/26/2022 FINAL  Final   Organism ID, Bacteria ESCHERICHIA COLI (A)  Final      Susceptibility   Escherichia coli - MIC*    AMPICILLIN >=32 RESISTANT Resistant     CEFAZOLIN <=4 SENSITIVE Sensitive     CEFEPIME <=0.12 SENSITIVE Sensitive     CEFTRIAXONE <=0.25 SENSITIVE Sensitive     CIPROFLOXACIN >=4 RESISTANT Resistant  GENTAMICIN <=1 SENSITIVE Sensitive     IMIPENEM <=0.25 SENSITIVE Sensitive     NITROFURANTOIN <=16 SENSITIVE Sensitive     TRIMETH/SULFA <=20 SENSITIVE Sensitive     AMPICILLIN/SULBACTAM 16 INTERMEDIATE Intermediate     PIP/TAZO <=4 SENSITIVE Sensitive     * >=100,000 COLONIES/mL ESCHERICHIA COLI  Blood Culture (routine x 2)     Status: None   Collection Time: 04/24/22  7:57 AM   Specimen: BLOOD LEFT HAND  Result Value Ref Range Status   Specimen Description BLOOD LEFT HAND  Final   Special Requests   Final    BOTTLES DRAWN AEROBIC AND ANAEROBIC Blood Culture results may not be optimal due to an inadequate volume of blood received in culture bottles   Culture   Final    NO GROWTH 5  DAYS Performed at [facility], [address] [place], [place] [address]    Report Status 04/29/2022 FINAL  Final  Blood Culture (routine x 2)     Status: None   Collection Time: 04/24/22  7:57 AM   Specimen: BLOOD RIGHT WRIST  Result Value Ref Range Status   Specimen Description BLOOD RIGHT WRIST  Final   Special Requests   Final    BOTTLES DRAWN AEROBIC AND ANAEROBIC Blood Culture adequate volume   Culture   Final    NO GROWTH 5 DAYS Performed at [facility], [address], [place], [place] [address]    Report Status 04/29/2022 FINAL  Final  MRSA Next Gen by PCR, Nasal     Status: None   Collection Time: 04/24/22 10:54 AM   Specimen: Urine, Clean Catch; Nasal Swab  Result Value Ref Range Status   MRSA by PCR Next Gen NOT DETECTED NOT DETECTED Final    Comment: (NOTE) The GeneXpert MRSA Assay (FDA approved for NASAL specimens only), is one component of a comprehensive MRSA colonization surveillance program. It is not intended to diagnose MRSA infection nor to guide or monitor treatment for MRSA infections. Test performance is not FDA approved in patients less than 59 years old. Performed at [facility], [address], [place], [place] [address]      Scheduled Meds:  budesonide (PULMICORT) nebulizer solution  0.5 mg Nebulization BID   buPROPion  150 mg Oral Daily   chlorhexidine  15 mL Mouth Rinse BID   Chlorhexidine Gluconate Cloth  6 each Topical Daily   dextromethorphan-guaiFENesin  1 tablet Oral BID   escitalopram  10 mg Oral Daily   furosemide  40 mg Intravenous BID   heparin injection (subcutaneous)  5,000 Units Subcutaneous Q8H   insulin aspart  0-15 Units Subcutaneous TID WC   insulin aspart  0-5 Units Subcutaneous QHS   insulin aspart  3 Units Subcutaneous TID WC   insulin detemir  15 Units Subcutaneous Daily   ipratropium  0.5 mg Nebulization Q6H   levalbuterol  0.63 mg Nebulization Q6H   mouth rinse  15 mL Mouth Rinse q12n4p   pantoprazole  40 mg Oral  Daily   polyethylene glycol  17 g Oral Daily   potassium chloride  40 mEq Oral Daily   potassium chloride  40 mEq Oral Once   rosuvastatin  5 mg Oral Daily   saccharomyces boulardii  250 mg Oral BID   Continuous Infusions:  piperacillin-tazobactam (ZOSYN)  IV Stopped (05/01/22 1005)    Procedures/Studies: CT Angio Chest Pulmonary Embolism (PE) W or WO Contrast  Result Date: 04/30/2022 CLINICAL DATA:  High probability for PE. Pleuritic chest  pain, hypoxia and dyspnea. EXAM: CT ANGIOGRAPHY CHEST WITH CONTRAST TECHNIQUE: Multidetector CT imaging of the chest was performed using the standard protocol during bolus administration of intravenous contrast. Multiplanar CT image reconstructions and MIPs were obtained to evaluate the vascular anatomy. RADIATION DOSE REDUCTION: This exam was performed according to the departmental dose-optimization program which includes automated exposure control, adjustment of the mA and/or kV according to patient size and/or use of iterative reconstruction technique. CONTRAST:  32m OMNIPAQUE IOHEXOL 350 MG/ML SOLN COMPARISON:  None Available. FINDINGS: Cardiovascular: Satisfactory opacification of the pulmonary arteries to the segmental level. No evidence of pulmonary embolism. Normal heart size. No pericardial effusion. Mediastinum/Nodes: No enlarged mediastinal, hilar, or axillary lymph nodes. Thyroid gland, trachea, and esophagus demonstrate no significan seen. T findings. Lungs/Pleura: There are patchy and confluent ground-glass opacities throughout both lungs most significant in the upper lobes. Airspace consolidation is seen in the lingula and left lower lobe with air bronchograms. There are trace bilateral pleural effusions. No pneumothorax. Trachea and central airways are patent. Upper Abdomen: Cholecystectomy clips are present. Musculoskeletal: Degenerative changes affect the spine. No acute fractures are Review of the MIP images confirms the above findings.  IMPRESSION: 1. No evidence for pulmonary embolism. 2. Diffuse patchy and confluent ground-glass opacities throughout both lungs. Findings are nonspecific and may be related to pulmonary edema, hemorrhage or infection. 3. Lingular and left lower lobe airspace consolidation compatible with pneumonia. 4. Trace bilateral pleural effusions. Electronically Signed   By: ARonney AstersM.D.   On: 04/30/2022 16:25   [place]oreaAbdomen Complete  Result Date: 04/30/2022 CLINICAL DATA:  Abnormal LFTs.  Cholecystectomy. EXAM: ABDOMEN ULTRASOUND COMPLETE COMPARISON:  None Available. FINDINGS: Gallbladder: Surgically absent Common bile duct: Diameter: 8 mm Liver: Increased echogenicity throughout the liver. No focal mass. Portal vein is patent on color Doppler imaging with normal direction of blood flow towards the liver. IVC: No abnormality visualized. Pancreas: Visualized portion unremarkable. Spleen: Size and appearance within normal limits. Right Kidney: Length: 11.3 cm. Echogenicity within normal limits. No mass or hydronephrosis visualized. Left Kidney: Length: 12.8 cm. Echogenicity within normal limits. No mass or hydronephrosis visualized. Abdominal aorta: No aneurysm identified within visualize limits. The distal aorta was obscured by bowel gas. Other findings: None. IMPRESSION: 1. Previous cholecystectomy. 2. The common bile duct measures 8 mm which is within normal limits after cholecystectomy. 3. Probable hepatic steatosis. 4. No other significant abnormalities. Electronically Signed   By: DDorise BullionIII M.D.   On: 04/30/2022 09:28   UKoreaVenous Img Lower Bilateral (DVT)  Result Date: 04/27/2022 CLINICAL DATA:  Bilateral lower extremity pain and swelling EXAM: BILATERAL LOWER EXTREMITY VENOUS DOPPLER ULTRASOUND TECHNIQUE: Gray-scale sonography with graded compression, as well as color Doppler and duplex ultrasound were performed to evaluate the lower extremity deep venous systems from the level of the common femoral  vein and including the common femoral, femoral, profunda femoral, popliteal and calf veins including the posterior tibial, peroneal and gastrocnemius veins when visible. The superficial great saphenous vein was also interrogated. Spectral Doppler was utilized to evaluate flow at rest and with distal augmentation maneuvers in the common femoral, femoral and popliteal veins. COMPARISON:  None Available. FINDINGS: RIGHT LOWER EXTREMITY Common Femoral Vein: No evidence of thrombus. Normal compressibility, respiratory phasicity and response to augmentation. Saphenofemoral Junction: No evidence of thrombus. Normal compressibility and flow on color Doppler imaging. Profunda Femoral Vein: No evidence of thrombus. Normal compressibility and flow on color Doppler imaging. Femoral Vein: No evidence of thrombus. Normal compressibility,  respiratory phasicity and response to augmentation. Popliteal Vein: No evidence of thrombus. Normal compressibility, respiratory phasicity and response to augmentation. Calf Veins: No evidence of thrombus. Normal compressibility and flow on color Doppler imaging. Superficial Great Saphenous Vein: No evidence of thrombus. Normal compressibility. Venous Reflux:  None. Other Findings:  None. LEFT LOWER EXTREMITY Common Femoral Vein: No evidence of thrombus. Normal compressibility, respiratory phasicity and response to augmentation. Saphenofemoral Junction: No evidence of thrombus. Normal compressibility and flow on color Doppler imaging. Profunda Femoral Vein: No evidence of thrombus. Normal compressibility and flow on color Doppler imaging. Femoral Vein: No evidence of thrombus. Normal compressibility, respiratory phasicity and response to augmentation. Popliteal Vein: No evidence of thrombus. Normal compressibility, respiratory phasicity and response to augmentation. Calf Veins: No evidence of thrombus. Normal compressibility and flow on color Doppler imaging. Superficial Great Saphenous Vein: No  evidence of thrombus. Normal compressibility. Venous Reflux:  None. Other Findings:  None. IMPRESSION: No evidence of deep venous thrombosis in either lower extremity. Electronically Signed   By: Jacqulynn Cadet M.D.   On: 04/27/2022 10:33   DG CHEST PORT 1 VIEW  Result Date: 04/29/2022 CLINICAL DATA:  Respiratory distress EXAM: PORTABLE CHEST 1 VIEW COMPARISON:  04/27/2022 FINDINGS: Stable cardiomediastinal contours. Extensive airspace opacity throughout the left lung with minimally improved aeration from prior. Worsening airspace consolidation in the right mid lung. Possible small left effusion. No pneumothorax. IMPRESSION: 1. Worsening airspace consolidation in the right mid lung. 2. Extensive airspace opacity throughout the left lung with slightly improved aeration compared to prior. Electronically Signed   By: Davina Poke D.O.   On: 04/29/2022 15:01   DG CHEST PORT 1 VIEW  Result Date: 04/27/2022 CLINICAL DATA:  Follow-up lobar pneumonia. EXAM: PORTABLE CHEST 1 VIEW COMPARISON:  Chest x-ray May 15, 23. FINDINGS: Increasing left greater than right airspace opacities. Airspace opacities are confluent in the left lung and patchy in the right lung. No visible pleural effusions or pneumothorax. Cardiac silhouette is largely obscured. No displaced fracture. IMPRESSION: Increasing left greater than right airspace opacities, concerning for worsening pneumonia. These results will be called to the ordering clinician or representative by the Radiologist Assistant, and communication documented in the PACS or [employer]. Electronically Signed   By: Margaretha Sheffield M.D.   On: 04/27/2022 08:07   DG Chest Port 1 View  Result Date: 04/24/2022 CLINICAL DATA:  Short of breath EXAM: PORTABLE CHEST 1 VIEW COMPARISON:  12/14/2020 FINDINGS: New consolidative opacities in the left lung. No pleural effusion. No pneumothorax. Heart size is normal. IMPRESSION: Multifocal left-sided pneumonia. Electronically  Signed   By: Macy Mis M.D.   On: 04/24/2022 07:46   ECHOCARDIOGRAM COMPLETE  Result Date: 04/27/2022    ECHOCARDIOGRAM REPORT   Patient Name:   MIONNA ADVINCULA Date of Exam: 04/27/2022 Medical Rec #:  [id number]        Height:       64.0 in Accession #:    [id number]       Weight:       232.1 lb Date of Birth:  [birth year]         BSA:          2.084 m Patient Age:    57 years         BP:           133/65 mmHg Patient Gender: F                HR:  86 bpm. Exam Location:  [facility] Procedure: 2D Echo, Cardiac Doppler and Color Doppler Indications:    CHF  History:        Patient has no prior history of Echocardiogram examinations.                 CHF; Risk Factors:Hypertension, Diabetes and Dyslipidemia.  Sonographer:    Wenda Low Referring Phys: [phone] Jeanell Mangan IMPRESSIONS  1. Left ventricular ejection fraction, by estimation, is 40 to 45%. The left ventricle has mildly decreased function. The left ventricle demonstrates global hypokinesis. The left ventricular internal cavity size was mildly dilated.  2. Right ventricular systolic function is low normal. The right ventricular size is normal. There is mildly elevated pulmonary artery systolic pressure.  3. The mitral valve is normal in structure. Mild mitral valve regurgitation.  4. The aortic valve is tricuspid. Aortic valve regurgitation is not visualized. Aortic valve sclerosis is present, with no evidence of aortic valve stenosis.  5. The inferior vena cava is dilated in size with <50% respiratory variability, suggesting right atrial pressure of 15 mmHg. FINDINGS  Left Ventricle: Left ventricular ejection fraction, by estimation, is 40 to 45%. The left ventricle has mildly decreased function. The left ventricle demonstrates global hypokinesis. The left ventricular internal cavity size was mildly dilated. There is  no left ventricular hypertrophy. Right Ventricle: The right ventricular size is normal. Right vetricular wall thickness was not  assessed. Right ventricular systolic function is low normal. There is mildly elevated pulmonary artery systolic pressure. The tricuspid regurgitant velocity is  2.42 m/s, and with an assumed right atrial pressure of 15 mmHg, the estimated right ventricular systolic pressure is 50.0 mmHg. Left Atrium: Left atrial size was normal in size. Right Atrium: Right atrial size was normal in size. Pericardium: There is no evidence of pericardial effusion. Mitral Valve: The mitral valve is normal in structure. Mild mitral valve regurgitation. MV peak gradient, 3.8 mmHg. The mean mitral valve gradient is 2.0 mmHg. Tricuspid Valve: The tricuspid valve is normal in structure. Tricuspid valve regurgitation is mild. Aortic Valve: The aortic valve is tricuspid. Aortic valve regurgitation is not visualized. Aortic valve sclerosis is present, with no evidence of aortic valve stenosis. Aortic valve mean gradient measures 4.0 mmHg. Aortic valve peak gradient measures 6.9  mmHg. Aortic valve area, by VTI measures 2.16 cm. Pulmonic Valve: The pulmonic valve was grossly normal. Pulmonic valve regurgitation is not visualized. Aorta: The aortic root is normal in size and structure. Venous: The inferior vena cava is dilated in size with less than 50% respiratory variability, suggesting right atrial pressure of 15 mmHg. IAS/Shunts: No atrial level shunt detected by color flow Doppler.  LEFT VENTRICLE PLAX 2D LVIDd:         5.20 cm     Diastology LVIDs:         3.65 cm     LV e' medial:    7.94 cm/s LV PW:         0.80 cm     LV E/e' medial:  8.7 LV IVS:        1.00 cm     LV e' lateral:   14.90 cm/s LVOT diam:     2.00 cm     LV E/e' lateral: 4.6 LV SV:         57 LV SV Index:   27 LVOT Area:     3.14 cm  LV Volumes (MOD) LV vol d, MOD A2C: 94.9 ml LV vol  d, MOD A4C: 81.3 ml LV vol s, MOD A2C: 48.9 ml LV vol s, MOD A4C: 42.9 ml LV SV MOD A2C:     46.0 ml LV SV MOD A4C:     81.3 ml LV SV MOD BP:      42.0 ml RIGHT VENTRICLE RV Basal diam:   3.55 cm RV Mid diam:    3.70 cm RV S prime:     12.90 cm/s TAPSE (M-mode): 2.4 cm LEFT ATRIUM             Index        RIGHT ATRIUM           Index LA diam:        4.30 cm 2.06 cm/m   RA Area:     15.70 cm LA Vol (A2C):   53.4 ml 25.62 ml/m  RA Volume:   44.80 ml  21.49 ml/m LA Vol (A4C):   55.1 ml 26.44 ml/m LA Biplane Vol: 58.7 ml 28.16 ml/m  AORTIC VALVE                    PULMONIC VALVE AV Area (Vmax):    2.27 cm     PV Vmax:       0.66 m/s AV Area (Vmean):   1.99 cm     PV Peak grad:  1.7 mmHg AV Area (VTI):     2.16 cm AV Vmax:           131.00 cm/s AV Vmean:          90.500 cm/s AV VTI:            0.262 m AV Peak Grad:      6.9 mmHg AV Mean Grad:      4.0 mmHg LVOT Vmax:         94.60 cm/s LVOT Vmean:        57.300 cm/s LVOT VTI:          0.180 m LVOT/AV VTI ratio: 0.69  AORTA Ao Root diam: 2.70 cm MITRAL VALVE               TRICUSPID VALVE MV Area (PHT): 4.17 cm    TR Peak grad:   23.4 mmHg MV Area VTI:   2.46 cm    TR Vmax:        242.00 cm/s MV Peak grad:  3.8 mmHg MV Mean grad:  2.0 mmHg    SHUNTS MV Vmax:       0.98 m/s    Systemic VTI:  0.18 m MV Vmean:      61.6 cm/s   Systemic Diam: 2.00 cm MV Decel Time: 182 msec MV E velocity: 69.00 cm/s MV A velocity: 77.60 cm/s MV E/A ratio:  0.89 Dorris Carnes MD Electronically signed by Dorris Carnes MD Signature Date/Time: 04/27/2022/10:03:09 PM    Final    DG ESOPHAGUS W DOUBLE CM (HD)  Result Date: 04/25/2022 CLINICAL DATA:  Dysphagia, feels like foods are sticking in her chest and lower cervical region, pneumonia EXAM: ESOPHOGRAM / BARIUM SWALLOW / BARIUM TABLET STUDY TECHNIQUE: Combined double contrast and single contrast examination performed using effervescent crystals, thick barium liquid, and thin barium liquid. The patient was observed with fluoroscopy swallowing a 13 mm barium sulphate tablet. FLUOROSCOPY: Radiation Exposure Index (as provided by the fluoroscopic device): 57.0 mGy Kerma COMPARISON:  None Available. FINDINGS: Esophageal  distention: Smooth narrowing at/just above gastroesophageal junction. Remainder of esophagus appears to distend normally. Filling  defects:  No filling defects 12.5 mm barium tablet: Obstructed at the GE junction and did not pass into stomach despite multiple swallows of water and thick barium. 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 No residuals. 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 Electronically Signed   By: Lavonia Dana M.D.   On: 04/25/2022 10:59    Orson Eva, DO  [facility]  If 7PM-7AM, please contact night-coverage [email or web address] Password [email or web address] 05/01/2022, 1:15 PM   LOS: 7 days

## 2022-05-01 NOTE — Progress Notes (Signed)
Gastroenterology Progress Note    Primary Care Physician:  Boscobel Nation, MD Primary Gastroenterologist:  Dr. Ardis Hughs ([facility])  Patient ID: Kelli [name]; [id number]; 21-Feb-1971    Subjective   Oxygen requirements decreasing. On 3 liters nasal cannula. Still with notable shortness of breath with any exertion. Decreased appetite. No dysphagia. Tolerating dysphagia 1 diet. No abdominal pain. No BM in 3 days. Feel urge to have BM but unable to go.    Objective   Vital signs in last 24 hours Temp:  [97.3 F (36.3 C)-98.6 F (37 C)] 97.6 F (36.4 C) (05/22 0700) Pulse Rate:  [75-94] 94 (05/22 0906) Resp:  [19-26] 26 (05/22 0906) BP: (96-131)/(50-107) 131/62 (05/22 0906) SpO2:  [90 %-99 %] 94 % (05/22 0906) Weight:  [92.8 kg-95.7 kg] 92.8 kg (05/22 0607) Last BM Date : 04/28/22  Physical Exam General:   Alert and oriented, pleasant, 3 liters O2 nasal cannula Head:  Normocephalic and atraumatic. Abdomen:  Bowel sounds present, soft, non-tender, non-distended.  Extremities:  Without  edema. Neurologic:  Alert and  oriented x4 Psych:  Alert and cooperative. Normal mood and affect.  Intake/Output from previous day: 05/21 0701 - 05/22 0700 In: 8469 [P.O.:1320; IV Piggyback:153] Out: 2000 [Urine:2000] Intake/Output this shift: Total I/O In: 200 [P.O.:200] Out: 1000 [Urine:1000]  Lab Results  Recent Labs    04/29/22 1616 04/30/22 0418  WBC 9.5 10.0  HGB 12.8 12.5  HCT 37.9 36.9  PLT 117* 127*   BMET Recent Labs    04/29/22 0109 05/01/22 0426  NA 141 136  K 2.9* 3.3*  CL 95* 91*  CO2 36* 31  GLUCOSE 184* 126*  BUN 15 17  CREATININE 0.38* 0.40*  CALCIUM 7.9* 7.7*   LFT Recent Labs    04/29/22 1616 05/01/22 0426  PROT 5.0* 5.2*  ALBUMIN 2.0* 1.9*  AST 42* 26  ALT 72* 43  ALKPHOS 340* 262*  BILITOT 0.6 0.9  BILIDIR 0.2 0.4*  IBILI 0.4 0.5     Studies/Results CT Angio Chest Pulmonary Embolism (PE) W or WO Contrast  Result Date:  04/30/2022 CLINICAL DATA:  High probability for PE. Pleuritic chest pain, hypoxia and dyspnea. EXAM: CT ANGIOGRAPHY CHEST WITH CONTRAST TECHNIQUE: Multidetector CT imaging of the chest was performed using the standard protocol during bolus administration of intravenous contrast. Multiplanar CT image reconstructions and MIPs were obtained to evaluate the vascular anatomy. RADIATION DOSE REDUCTION: This exam was performed according to the departmental dose-optimization program which includes automated exposure control, adjustment of the mA and/or kV according to patient size and/or use of iterative reconstruction technique. CONTRAST:  45mL OMNIPAQUE IOHEXOL 350 MG/ML SOLN COMPARISON:  None Available. FINDINGS: Cardiovascular: Satisfactory opacification of the pulmonary arteries to the segmental level. No evidence of pulmonary embolism. Normal heart size. No pericardial effusion. Mediastinum/Nodes: No enlarged mediastinal, hilar, or axillary lymph nodes. Thyroid gland, trachea, and esophagus demonstrate no significan seen. T findings. Lungs/Pleura: There are patchy and confluent ground-glass opacities throughout both lungs most significant in the upper lobes. Airspace consolidation is seen in the lingula and left lower lobe with air bronchograms. There are trace bilateral pleural effusions. No pneumothorax. Trachea and central airways are patent. Upper Abdomen: Cholecystectomy clips are present. Musculoskeletal: Degenerative changes affect the spine. No acute fractures are Review of the MIP images confirms the above findings. IMPRESSION: 1. No evidence for pulmonary embolism. 2. Diffuse patchy and confluent ground-glass opacities throughout both lungs. Findings are nonspecific and may be related to pulmonary edema, hemorrhage or infection.  3. Lingular and left lower lobe airspace consolidation compatible with pneumonia. 4. Trace bilateral pleural effusions. Electronically Signed   By: Ronney Asters M.D.   On:  04/30/2022 16:25   US Abdomen Complete  Result Date: 04/30/2022 CLINICAL DATA:  Abnormal LFTs.  Cholecystectomy. EXAM: ABDOMEN ULTRASOUND COMPLETE COMPARISON:  None Available. FINDINGS: Gallbladder: Surgically absent Common bile duct: Diameter: 8 mm Liver: Increased echogenicity throughout the liver. No focal mass. Portal vein is patent on color Doppler imaging with normal direction of blood flow towards the liver. IVC: No abnormality visualized. Pancreas: Visualized portion unremarkable. Spleen: Size and appearance within normal limits. Right Kidney: Length: 11.3 cm. Echogenicity within normal limits. No mass or hydronephrosis visualized. Left Kidney: Length: 12.8 cm. Echogenicity within normal limits. No mass or hydronephrosis visualized. Abdominal aorta: No aneurysm identified within visualize limits. The distal aorta was obscured by bowel gas. Other findings: None. IMPRESSION: 1. Previous cholecystectomy. 2. The common bile duct measures 8 mm which is within normal limits after cholecystectomy. 3. Probable hepatic steatosis. 4. No other significant abnormalities. Electronically Signed   By: Dorise Bullion III M.D.   On: 04/30/2022 09:28   US Venous Img Lower Bilateral (DVT)  Result Date: 04/27/2022 CLINICAL DATA:  Bilateral lower extremity pain and swelling EXAM: BILATERAL LOWER EXTREMITY VENOUS DOPPLER ULTRASOUND TECHNIQUE: Gray-scale sonography with graded compression, as well as color Doppler and duplex ultrasound were performed to evaluate the lower extremity deep venous systems from the level of the common femoral vein and including the common femoral, femoral, profunda femoral, popliteal and calf veins including the posterior tibial, peroneal and gastrocnemius veins when visible. The superficial great saphenous vein was also interrogated. Spectral Doppler was utilized to evaluate flow at rest and with distal augmentation maneuvers in the common femoral, femoral and popliteal veins. COMPARISON:   None Available. FINDINGS: RIGHT LOWER EXTREMITY Common Femoral Vein: No evidence of thrombus. Normal compressibility, respiratory phasicity and response to augmentation. Saphenofemoral Junction: No evidence of thrombus. Normal compressibility and flow on color Doppler imaging. Profunda Femoral Vein: No evidence of thrombus. Normal compressibility and flow on color Doppler imaging. Femoral Vein: No evidence of thrombus. Normal compressibility, respiratory phasicity and response to augmentation. Popliteal Vein: No evidence of thrombus. Normal compressibility, respiratory phasicity and response to augmentation. Calf Veins: No evidence of thrombus. Normal compressibility and flow on color Doppler imaging. Superficial Great Saphenous Vein: No evidence of thrombus. Normal compressibility. Venous Reflux:  None. Other Findings:  None. LEFT LOWER EXTREMITY Common Femoral Vein: No evidence of thrombus. Normal compressibility, respiratory phasicity and response to augmentation. Saphenofemoral Junction: No evidence of thrombus. Normal compressibility and flow on color Doppler imaging. Profunda Femoral Vein: No evidence of thrombus. Normal compressibility and flow on color Doppler imaging. Femoral Vein: No evidence of thrombus. Normal compressibility, respiratory phasicity and response to augmentation. Popliteal Vein: No evidence of thrombus. Normal compressibility, respiratory phasicity and response to augmentation. Calf Veins: No evidence of thrombus. Normal compressibility and flow on color Doppler imaging. Superficial Great Saphenous Vein: No evidence of thrombus. Normal compressibility. Venous Reflux:  None. Other Findings:  None. IMPRESSION: No evidence of deep venous thrombosis in either lower extremity. Electronically Signed   By: Jacqulynn Cadet M.D.   On: 04/27/2022 10:33   DG CHEST PORT 1 VIEW  Result Date: 04/29/2022 CLINICAL DATA:  Respiratory distress EXAM: PORTABLE CHEST 1 VIEW COMPARISON:  04/27/2022  FINDINGS: Stable cardiomediastinal contours. Extensive airspace opacity throughout the left lung with minimally improved aeration from prior. Worsening airspace consolidation in the  right mid lung. Possible small left effusion. No pneumothorax. IMPRESSION: 1. Worsening airspace consolidation in the right mid lung. 2. Extensive airspace opacity throughout the left lung with slightly improved aeration compared to prior. Electronically Signed   By: Duanne Guess D.O.   On: 04/29/2022 15:01   DG CHEST PORT 1 VIEW  Result Date: 04/27/2022 CLINICAL DATA:  Follow-up lobar pneumonia. EXAM: PORTABLE CHEST 1 VIEW COMPARISON:  Chest x-ray May 15, 23. FINDINGS: Increasing left greater than right airspace opacities. Airspace opacities are confluent in the left lung and patchy in the right lung. No visible pleural effusions or pneumothorax. Cardiac silhouette is largely obscured. No displaced fracture. IMPRESSION: Increasing left greater than right airspace opacities, concerning for worsening pneumonia. These results will be called to the ordering clinician or representative by the Radiologist Assistant, and communication documented in the PACS or [employer]. Electronically Signed   By: Feliberto Harts M.D.   On: 04/27/2022 08:07   DG Chest Port 1 View  Result Date: 04/24/2022 CLINICAL DATA:  Short of breath EXAM: PORTABLE CHEST 1 VIEW COMPARISON:  12/14/2020 FINDINGS: New consolidative opacities in the left lung. No pleural effusion. No pneumothorax. Heart size is normal. IMPRESSION: Multifocal left-sided pneumonia. Electronically Signed   By: Guadlupe Spanish M.D.   On: 04/24/2022 07:46   ECHOCARDIOGRAM COMPLETE  Result Date: 04/27/2022    ECHOCARDIOGRAM REPORT   Patient Name:   Kelli [name] Date of Exam: 04/27/2022 Medical Rec #:  [id number]        Height:       64.0 in Accession #:    [id number]       Weight:       232.1 lb Date of Birth:  [birth year]         BSA:          2.084 m Patient Age:    51 years          BP:           133/65 mmHg Patient Gender: F                HR:           86 bpm. [facility] Procedure: 2D Echo, Cardiac Doppler and Color Doppler Indications:    CHF  History:        Patient has no prior history of Echocardiogram examinations.                 CHF; Risk Factors:Hypertension, Diabetes and Dyslipidemia.  Sonographer:    Mikki Harbor Referring Phys: [phone] DAVID TAT IMPRESSIONS  1. Left ventricular ejection fraction, by estimation, is 40 to 45%. The left ventricle has mildly decreased function. The left ventricle demonstrates global hypokinesis. The left ventricular internal cavity size was mildly dilated.  2. Right ventricular systolic function is low normal. The right ventricular size is normal. There is mildly elevated pulmonary artery systolic pressure.  3. The mitral valve is normal in structure. Mild mitral valve regurgitation.  4. The aortic valve is tricuspid. Aortic valve regurgitation is not visualized. Aortic valve sclerosis is present, with no evidence of aortic valve stenosis.  5. The inferior vena cava is dilated in size with <50% respiratory variability, suggesting right atrial pressure of 15 mmHg. FINDINGS  Left Ventricle: Left ventricular ejection fraction, by estimation, is 40 to 45%. The left ventricle has mildly decreased function. The left ventricle demonstrates global hypokinesis. The left ventricular internal cavity size was mildly dilated. There is  no left ventricular hypertrophy. Right Ventricle: The right ventricular size is normal. Right vetricular wall thickness was not assessed. Right ventricular systolic function is low normal. There is mildly elevated pulmonary artery systolic pressure. The tricuspid regurgitant velocity is  2.42 m/s, and with an assumed right atrial pressure of 15 mmHg, the estimated right ventricular systolic pressure is 50.2 mmHg. Left Atrium: Left atrial size was normal in size. Right Atrium: Right atrial size was normal in  size. Pericardium: There is no evidence of pericardial effusion. Mitral Valve: The mitral valve is normal in structure. Mild mitral valve regurgitation. MV peak gradient, 3.8 mmHg. The mean mitral valve gradient is 2.0 mmHg. Tricuspid Valve: The tricuspid valve is normal in structure. Tricuspid valve regurgitation is mild. Aortic Valve: The aortic valve is tricuspid. Aortic valve regurgitation is not visualized. Aortic valve sclerosis is present, with no evidence of aortic valve stenosis. Aortic valve mean gradient measures 4.0 mmHg. Aortic valve peak gradient measures 6.9  mmHg. Aortic valve area, by VTI measures 2.16 cm. Pulmonic Valve: The pulmonic valve was grossly normal. Pulmonic valve regurgitation is not visualized. Aorta: The aortic root is normal in size and structure. Venous: The inferior vena cava is dilated in size with less than 50% respiratory variability, suggesting right atrial pressure of 15 mmHg. IAS/Shunts: No atrial level shunt detected by color flow Doppler.  LEFT VENTRICLE PLAX 2D LVIDd:         5.20 cm     Diastology LVIDs:         3.65 cm     LV e' medial:    7.94 cm/s LV PW:         0.80 cm     LV E/e' medial:  8.7 LV IVS:        1.00 cm     LV e' lateral:   14.90 cm/s LVOT diam:     2.00 cm     LV E/e' lateral: 4.6 LV SV:         57 LV SV Index:   27 LVOT Area:     3.14 cm  LV Volumes (MOD) LV vol d, MOD A2C: 94.9 ml LV vol d, MOD A4C: 81.3 ml LV vol s, MOD A2C: 48.9 ml LV vol s, MOD A4C: 42.9 ml LV SV MOD A2C:     46.0 ml LV SV MOD A4C:     81.3 ml LV SV MOD BP:      42.0 ml RIGHT VENTRICLE RV Basal diam:  3.55 cm RV Mid diam:    3.70 cm RV S prime:     12.90 cm/s TAPSE (M-mode): 2.4 cm LEFT ATRIUM             Index        RIGHT ATRIUM           Index LA diam:        4.30 cm 2.06 cm/m   RA Area:     15.70 cm LA Vol (A2C):   53.4 ml 25.62 ml/m  RA Volume:   44.80 ml  21.49 ml/m LA Vol (A4C):   55.1 ml 26.44 ml/m LA Biplane Vol: 58.7 ml 28.16 ml/m  AORTIC VALVE                     PULMONIC VALVE AV Area (Vmax):    2.27 cm     PV Vmax:       0.66 m/s AV Area (Vmean):   1.99 cm  PV Peak grad:  1.7 mmHg AV Area (VTI):     2.16 cm AV Vmax:           131.00 cm/s AV Vmean:          90.500 cm/s AV VTI:            0.262 m AV Peak Grad:      6.9 mmHg AV Mean Grad:      4.0 mmHg LVOT Vmax:         94.60 cm/s LVOT Vmean:        57.300 cm/s LVOT VTI:          0.180 m LVOT/AV VTI ratio: 0.69  AORTA Ao Root diam: 2.70 cm MITRAL VALVE               TRICUSPID VALVE MV Area (PHT): 4.17 cm    TR Peak grad:   23.4 mmHg MV Area VTI:   2.46 cm    TR Vmax:        242.00 cm/s MV Peak grad:  3.8 mmHg MV Mean grad:  2.0 mmHg    SHUNTS MV Vmax:       0.98 m/s    Systemic VTI:  0.18 m MV Vmean:      61.6 cm/s   Systemic Diam: 2.00 cm MV Decel Time: 182 msec MV E velocity: 69.00 cm/s MV A velocity: 77.60 cm/s MV E/A ratio:  0.89 Dorris Carnes MD Electronically signed by Dorris Carnes MD Signature Date/Time: 04/27/2022/10:03:09 PM    Final    DG ESOPHAGUS W DOUBLE CM (HD)  Result Date: 04/25/2022 CLINICAL DATA:  Dysphagia, feels like foods are sticking in her chest and lower cervical region, pneumonia EXAM: ESOPHOGRAM / BARIUM SWALLOW / BARIUM TABLET STUDY TECHNIQUE: Combined double contrast and single contrast examination performed using effervescent crystals, thick barium liquid, and thin barium liquid. The patient was observed with fluoroscopy swallowing a 13 mm barium sulphate tablet. FLUOROSCOPY: Radiation Exposure Index (as provided by the fluoroscopic device): 57.0 mGy Kerma COMPARISON:  None Available. FINDINGS: Esophageal distention: Smooth narrowing at/just above gastroesophageal junction. Remainder of esophagus appears to distend normally. Filling defects:  No filling defects 12.5 mm barium tablet: Obstructed at the GE junction and did not pass into stomach despite multiple swallows of water and thick barium. 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 No residuals. 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 Electronically Signed   By: Lavonia Dana M.D.   On: 04/25/2022 10:59    Assessment  51 y.o. female admitted with sepsis in setting of acute respiratory failure with hypoxia in setting of pneumonia, acute systolic CHF (ECHO 74-08% this admission), and GI consulted due to dysphagia. 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   Dysphagia: tolerating Dysphagia 1 diet without difficulty. Not a candidate for EGD until improved from cardiopulmonary standpoint. Hopefully within next 24-48 hours.   Chronic diarrhea: was on cholestyramine as outpatient. Now with constipation and no BM for 3 days. Will discontinue cholestyramine. Miralax daily prn. First dose now.   Elevated LFTs: noted on admission. Transaminases now normalized. Alk Phos high of 340, now 262 today. Fatty liver on [place]. Likely acutely elevated in setting of acute illness and hepatic congestion.     Plan / Recommendations  Discontinue Questran Miralax daily prn, first dose now NPO after midnight: assess readiness for EGD/dilation in am Will need to hold Heparin day of EGD Follow [facility]   LOS: 7 days    05/01/2022, 10:04 AM  Annitta Needs, PhD, ANP-BC [facility]

## 2022-05-01 NOTE — Progress Notes (Addendum)
Progress Note  Patient Name: Kelli [name] Date of Encounter: 05/01/2022  [facility] Cardiologist: None    Subjective   Breathing better. Denies any chest pain  Inpatient Medications    Scheduled Meds:  budesonide (PULMICORT) nebulizer solution  0.5 mg Nebulization BID   buPROPion  150 mg Oral Daily   chlorhexidine  15 mL Mouth Rinse BID   Chlorhexidine Gluconate Cloth  6 each Topical Daily   cholestyramine light  4 g Oral Daily   dextromethorphan-guaiFENesin  1 tablet Oral BID   escitalopram  10 mg Oral Daily   furosemide  40 mg Intravenous BID   heparin injection (subcutaneous)  5,000 Units Subcutaneous Q8H   insulin aspart  0-15 Units Subcutaneous TID WC   insulin aspart  0-5 Units Subcutaneous QHS   insulin aspart  3 Units Subcutaneous TID WC   insulin detemir  15 Units Subcutaneous Daily   ipratropium  0.5 mg Nebulization Q6H   levalbuterol  0.63 mg Nebulization Q6H   mouth rinse  15 mL Mouth Rinse q12n4p   pantoprazole  40 mg Oral Daily   potassium chloride  40 mEq Oral Daily   rosuvastatin  5 mg Oral Daily   saccharomyces boulardii  250 mg Oral BID   Continuous Infusions:  piperacillin-tazobactam (ZOSYN)  IV 3.375 g (05/01/22 0604)   PRN Meds: acetaminophen **OR** acetaminophen, ondansetron **OR** ondansetron (ZOFRAN) IV, sodium chloride, SUMAtriptan   Vital Signs    Vitals:   05/01/22 0600 05/01/22 0607 05/01/22 0700 05/01/22 0814  BP: 130/84     Pulse: 81     Resp: 20     Temp:   97.6 F (36.4 C)   TempSrc:   Oral   SpO2: 94%   97%  Weight:  92.8 kg    Height:        Intake/Output Summary (Last 24 hours) at 05/01/2022 0844 Last data filed at 05/01/2022 0800 Gross per 24 hour  Intake 1069.42 ml  Output 2300 ml  Net -1230.58 ml      05/01/2022    6:07 AM 05/01/2022    5:00 AM 04/30/2022    5:00 AM  Last 3 Weights  Weight (lbs) 204 lb 10.1 oz 210 lb 15.7 oz 210 lb 9.6 oz  Weight (kg) 92.82 kg 95.7 kg 95.528 kg      Telemetry    NSR  - Personally Reviewed  ECG    Sinus tachycardia with poor R wave progression ant - Personally Reviewed  Physical Exam    GEN: No acute distress.   Neck: No JVD Cardiac: RRR, no murmurs, rubs, or gallops.  Respiratory: rales, crackles left lung throughout, decreased breath sounds on right GI: Soft, nontender, non-distended  MS: trace edema; No deformity. Neuro:  Nonfocal  Psych: Normal affect   Labs    High Sensitivity Troponin:   Recent Labs  Lab 04/24/22 0735 04/24/22 0921  TROPONINIHS 5 4     Chemistry Recent Labs  Lab 04/26/22 0424 04/27/22 0458 04/28/22 0342 04/29/22 0109 04/29/22 1616 05/01/22 0426  NA 139 140 141 141  --   --   K 3.3* 3.5 3.4* 2.9*  --   --   CL 107 104 100 95*  --   --   CO2 '28 27 29 '$ 36*  --   --   GLUCOSE 72 84 164* 184*  --   --   BUN '20 18 16 15  '$ --   --   CREATININE 0.49 0.42* 0.40* 0.38*  --   --  CALCIUM 8.2* 8.3* 8.1* 7.9*  --   --   MG 1.9  --  1.6* 1.9  --   --   PROT  --   --   --   --  5.0* 5.2*  ALBUMIN  --   --   --   --  2.0* 1.9*  AST  --   --   --   --  42* 26  ALT  --   --   --   --  72* 43  ALKPHOS  --   --   --   --  340* 262*  BILITOT  --   --   --   --  0.6 0.9  GFRNONAA >60 >60 >60 >60  --   --   ANIONGAP 4* '9 12 10  '$ --   --     Lipids No results for input(s): CHOL, TRIG, HDL, LABVLDL, LDLCALC, CHOLHDL in the last 168 hours.  Hematology Recent Labs  Lab 04/27/22 0458 04/29/22 1616 04/30/22 0418  WBC 3.4* 9.5 10.0  RBC 3.70* 3.81* 3.69*  HGB 12.5 12.8 12.5  HCT 38.3 37.9 36.9  MCV 103.5* 99.5 100.0  MCH 33.8 33.6 33.9  MCHC 32.6 33.8 33.9  RDW 13.5 13.4 13.4  PLT 107* 117* 127*   Thyroid No results for input(s): TSH, FREET4 in the last 168 hours.  BNP Recent Labs  Lab 04/27/22 0458 04/29/22 1616  BNP 1,360.0* 113.0*    DDimer  Recent Labs  Lab 04/29/22 1720  DDIMER 2.94*     Radiology    CT Angio Chest Pulmonary Embolism (PE) W or WO Contrast  Result Date: 04/30/2022 CLINICAL DATA:   High probability for PE. Pleuritic chest pain, hypoxia and dyspnea. EXAM: CT ANGIOGRAPHY CHEST WITH CONTRAST TECHNIQUE: Multidetector CT imaging of the chest was performed using the standard protocol during bolus administration of intravenous contrast. Multiplanar CT image reconstructions and MIPs were obtained to evaluate the vascular anatomy. RADIATION DOSE REDUCTION: This exam was performed according to the departmental dose-optimization program which includes automated exposure control, adjustment of the mA and/or kV according to patient size and/or use of iterative reconstruction technique. CONTRAST:  77m OMNIPAQUE IOHEXOL 350 MG/ML SOLN COMPARISON:  None Available. FINDINGS: Cardiovascular: Satisfactory opacification of the pulmonary arteries to the segmental level. No evidence of pulmonary embolism. Normal heart size. No pericardial effusion. Mediastinum/Nodes: No enlarged mediastinal, hilar, or axillary lymph nodes. Thyroid gland, trachea, and esophagus demonstrate no significan seen. T findings. Lungs/Pleura: There are patchy and confluent ground-glass opacities throughout both lungs most significant in the upper lobes. Airspace consolidation is seen in the lingula and left lower lobe with air bronchograms. There are trace bilateral pleural effusions. No pneumothorax. Trachea and central airways are patent. Upper Abdomen: Cholecystectomy clips are present. Musculoskeletal: Degenerative changes affect the spine. No acute fractures are Review of the MIP images confirms the above findings. IMPRESSION: 1. No evidence for pulmonary embolism. 2. Diffuse patchy and confluent ground-glass opacities throughout both lungs. Findings are nonspecific and may be related to pulmonary edema, hemorrhage or infection. 3. Lingular and left lower lobe airspace consolidation compatible with pneumonia. 4. Trace bilateral pleural effusions. Electronically Signed   By: ARonney AstersM.D.   On: 04/30/2022 16:25   UK[place]Abdomen  Complete  Result Date: 04/30/2022 CLINICAL DATA:  Abnormal LFTs.  Cholecystectomy. EXAM: ABDOMEN ULTRASOUND COMPLETE COMPARISON:  None Available. FINDINGS: Gallbladder: Surgically absent Common bile duct: Diameter: 8 mm Liver: Increased echogenicity throughout the liver. No focal mass.  Portal vein is patent on color Doppler imaging with normal direction of blood flow towards the liver. IVC: No abnormality visualized. Pancreas: Visualized portion unremarkable. Spleen: Size and appearance within normal limits. Right Kidney: Length: 11.3 cm. Echogenicity within normal limits. No mass or hydronephrosis visualized. Left Kidney: Length: 12.8 cm. Echogenicity within normal limits. No mass or hydronephrosis visualized. Abdominal aorta: No aneurysm identified within visualize limits. The distal aorta was obscured by bowel gas. Other findings: None. IMPRESSION: 1. Previous cholecystectomy. 2. The common bile duct measures 8 mm which is within normal limits after cholecystectomy. 3. Probable hepatic steatosis. 4. No other significant abnormalities. Electronically Signed   By: Dorise Bullion III M.D.   On: 04/30/2022 09:28   DG CHEST PORT 1 VIEW  Result Date: 04/29/2022 CLINICAL DATA:  Respiratory distress EXAM: PORTABLE CHEST 1 VIEW COMPARISON:  04/27/2022 FINDINGS: Stable cardiomediastinal contours. Extensive airspace opacity throughout the left lung with minimally improved aeration from prior. Worsening airspace consolidation in the right mid lung. Possible small left effusion. No pneumothorax. IMPRESSION: 1. Worsening airspace consolidation in the right mid lung. 2. Extensive airspace opacity throughout the left lung with slightly improved aeration compared to prior. Electronically Signed   By: Davina Poke D.O.   On: 04/29/2022 15:01    Cardiac Studies   ECHOCARDIOGRAM COMPLETE   Result Date: 04/27/2022    ECHOCARDIOGRAM REPORT   Patient Name:   Kelli [name] Date of Exam: 04/27/2022 Medical Rec #:   [id number]        Height:       64.0 in Accession #:    [id number]       Weight:       232.1 lb Date of Birth:  Nov 25, 1971         BSA:          2.084 m Patient Age:    51 years         BP:           133/65 mmHg Patient Gender: F                HR:           86 bpm. Exam Location:  [facility] Procedure: 2D Echo, Cardiac Doppler and Color Doppler Indications:    CHF  History:        Patient has no prior history of Echocardiogram examinations.                 CHF; Risk Factors:Hypertension, Diabetes and Dyslipidemia.  Sonographer:    Wenda Low Referring Phys: [phone] DAVID TAT IMPRESSIONS  1. Left ventricular ejection fraction, by estimation, is 40 to 45%. The left ventricle has mildly decreased function. The left ventricle demonstrates global hypokinesis. The left ventricular internal cavity size was mildly dilated.  2. Right ventricular systolic function is low normal. The right ventricular size is normal. There is mildly elevated pulmonary artery systolic pressure.  3. The mitral valve is normal in structure. Mild mitral valve regurgitation.  4. The aortic valve is tricuspid. Aortic valve regurgitation is not visualized. Aortic valve sclerosis is present, with no evidence of aortic valve stenosis.  5. The inferior vena cava is dilated in size with <50% respiratory variability, suggesting right atrial pressure of 15 mmHg. FINDINGS  Left Ventricle: Left ventricular ejection fraction, by estimation, is 40 to 45%. The left ventricle has mildly decreased function. The left ventricle demonstrates global hypokinesis. The left ventricular internal cavity size was mildly dilated. There is  no  left ventricular hypertrophy. Right Ventricle: The right ventricular size is normal. Right vetricular wall thickness was not assessed. Right ventricular systolic function is low normal. There is mildly elevated pulmonary artery systolic pressure. The tricuspid regurgitant velocity is  2.42 m/s, and with an assumed right atrial  pressure of 15 mmHg, the estimated right ventricular systolic pressure is 72.5 mmHg. Left Atrium: Left atrial size was normal in size. Right Atrium: Right atrial size was normal in size. Pericardium: There is no evidence of pericardial effusion. Mitral Valve: The mitral valve is normal in structure. Mild mitral valve regurgitation. MV peak gradient, 3.8 mmHg. The mean mitral valve gradient is 2.0 mmHg. Tricuspid Valve: The tricuspid valve is normal in structure. Tricuspid valve regurgitation is mild. Aortic Valve: The aortic valve is tricuspid. Aortic valve regurgitation is not visualized. Aortic valve sclerosis is present, with no evidence of aortic valve stenosis. Aortic valve mean gradient measures 4.0 mmHg. Aortic valve peak gradient measures 6.9  mmHg. Aortic valve area, by VTI measures 2.16 cm. Pulmonic Valve: The pulmonic valve was grossly normal. Pulmonic valve regurgitation is not visualized. Aorta: The aortic root is normal in size and structure. Venous: The inferior vena cava is dilated in size with less than 50% respiratory variability, suggesting right atrial pressure of 15 mmHg. IAS/Shunts: No atrial level shunt detected by color flow Doppler.  LEFT VENTRICLE PLAX 2D LVIDd:         5.20 cm     Diastology LVIDs:         3.65 cm     LV e' medial:    7.94 cm/s LV PW:         0.80 cm     LV E/e' medial:  8.7 LV IVS:        1.00 cm     LV e' lateral:   14.90 cm/s LVOT diam:     2.00 cm     LV E/e' lateral: 4.6 LV SV:         57 LV SV Index:   27 LVOT Area:     3.14 cm  LV Volumes (MOD) LV vol d, MOD A2C: 94.9 ml LV vol d, MOD A4C: 81.3 ml LV vol s, MOD A2C: 48.9 ml LV vol s, MOD A4C: 42.9 ml LV SV MOD A2C:     46.0 ml LV SV MOD A4C:     81.3 ml LV SV MOD BP:      42.0 ml RIGHT VENTRICLE RV Basal diam:  3.55 cm RV Mid diam:    3.70 cm RV S prime:     12.90 cm/s TAPSE (M-mode): 2.4 cm LEFT ATRIUM             Index        RIGHT ATRIUM           Index LA diam:        4.30 cm 2.06 cm/m   RA Area:     15.70  cm LA Vol (A2C):   53.4 ml 25.62 ml/m  RA Volume:   44.80 ml  21.49 ml/m LA Vol (A4C):   55.1 ml 26.44 ml/m LA Biplane Vol: 58.7 ml 28.16 ml/m  AORTIC VALVE                    PULMONIC VALVE AV Area (Vmax):    2.27 cm     PV Vmax:       0.66 m/s AV Area (Vmean):   1.99 cm  PV Peak grad:  1.7 mmHg AV Area (VTI):     2.16 cm AV Vmax:           131.00 cm/s AV Vmean:          90.500 cm/s AV VTI:            0.262 m AV Peak Grad:      6.9 mmHg AV Mean Grad:      4.0 mmHg LVOT Vmax:         94.60 cm/s LVOT Vmean:        57.300 cm/s LVOT VTI:          0.180 m LVOT/AV VTI ratio: 0.69  AORTA Ao Root diam: 2.70 cm MITRAL VALVE               TRICUSPID VALVE MV Area (PHT): 4.17 cm    TR Peak grad:   23.4 mmHg MV Area VTI:   2.46 cm    TR Vmax:        242.00 cm/s MV Peak grad:  3.8 mmHg MV Mean grad:  2.0 mmHg    SHUNTS MV Vmax:       0.98 m/s    Systemic VTI:  0.18 m MV Vmean:      61.6 cm/s   Systemic Diam: 2.00 cm MV Decel Time: 182 msec MV E velocity: 69.00 cm/s MV A velocity: 77.60 cm/s MV E/A ratio:  0.89 Dorris Carnes MD Electronically signed by Dorris Carnes MD Signature Date/Time: 04/27/2022/10:03:09 PM    Final       Patient Profile     51 y.o. female   with HLD with DM, Anemia and diverticulosis, presenting with PNA.  Found to have low EF as part of her evaluation who is being seen 04/28/2022 for the evaluation of HF.    Assessment & Plan    New cardiomyopathy LVEF 40-45% on echo in the setting of sepsis pneumonia. Multiple CV risk factors, no chest pain.  Plan to repeat echocardiogram as outpatient once recovers from sepsis pneumonia, if persistent systolic dysfunction will need ischemic evaluation. Last K 2.9 5/20. Repeat bmet today. Plan to add losartan and BB prior to discharge   Acute Systolic CHF I/O's negative 10.6 L since admission, 1000 cc out this am, still sounds wet on exam, continue IV lasix 40 mg bid  Acute respiratory failure with hypoxia in setting of multilobar pneumonia left lung-  was requiring 12 L O2 yest but down 3L this am. CT yest no PE, diffuse patchy and confluent ground glass opacities in both lungs.  Uncontrolled DM2 A1C 11.5  HLD on Crestor  Tobacco abuse-says she will quit       For questions or updates, please contact [place] Please consult [email or web address] for contact info under        Signed, Ermalinda Barrios, PA-C  05/01/2022, 8:44 AM     Patient seen and examined.  Agree with above documentation.  On exam, patient is alert and oriented, regular rate and rhythm, no murmurs, left-sided crackles, no LE edema, + JVD.  She has been diuresing well, -10 L on admission.  BMET shows stable creatinine at 0.4.  Potassium 3.3.  Continue IV Lasix.  Replete for K greater than 4, mag greater than 2.  Plan to reevaluate her systolic function as an outpatient once she recovers from acute illness.  If systolic function remains reduced, will need ischemic evaluation  Donato Heinz, MD

## 2022-05-01 NOTE — Plan of Care (Signed)

## 2022-05-01 NOTE — Progress Notes (Signed)
Inpatient Diabetes Program Recommendations  AACE/ADA: New Consensus Statement on Inpatient Glycemic Control   Target Ranges:  Prepandial:   less than 140 mg/dL      Peak postprandial:   less than 180 mg/dL (1-2 hours)      Critically ill patients:  140 - 180 mg/dL     Latest Reference Range & Units 05/01/22 07:35 05/01/22 11:09  Glucose-Capillary 70 - 99 mg/dL 151 (H) 259 (H)    Latest Reference Range & Units 04/30/22 07:51 04/30/22 11:14 04/30/22 16:04 04/30/22 21:39  Glucose-Capillary 70 - 99 mg/dL 151 (H) 195 (H) 157 (H) 90   Review of Glycemic Control  Diabetes history: DM2 Outpatient Diabetes medications: Metformin 500 mg BID Current orders for Inpatient glycemic control: Levemir 15 units daily, Novolog 0-15 units TID with meals, Novolog 0-5 units QHS, Novolog 3 units TID with meals  Inpatient Diabetes Program Recommendations:    Insulin: May want to consider increasing meal coverage to Novolog 5 units TID with meals.  Thanks, Barnie Alderman, RN, MSN, CDE Diabetes Coordinator Inpatient Diabetes Program [phone] (Team Pager from 8am to 5pm)

## 2022-05-01 NOTE — ED Notes (Signed)
Have been able to wean oxygen.

## 2022-05-02 DIAGNOSIS — I5020 Unspecified systolic (congestive) heart failure: Secondary | ICD-10-CM

## 2022-05-02 DIAGNOSIS — J9601 Acute respiratory failure with hypoxia: Secondary | ICD-10-CM | POA: Diagnosis not present

## 2022-05-02 DIAGNOSIS — J181 Lobar pneumonia, unspecified organism: Secondary | ICD-10-CM | POA: Diagnosis not present

## 2022-05-02 DIAGNOSIS — I5021 Acute systolic (congestive) heart failure: Secondary | ICD-10-CM | POA: Diagnosis not present

## 2022-05-02 LAB — GLUCOSE, CAPILLARY
Glucose-Capillary: 159 mg/dL — ABNORMAL HIGH (ref 70–99)
Glucose-Capillary: 331 mg/dL — ABNORMAL HIGH (ref 70–99)
Glucose-Capillary: 253 mg/dL — ABNORMAL HIGH (ref 70–99)

## 2022-05-02 LAB — BASIC METABOLIC PANEL
Anion gap: 10 (ref 5–15)
BUN: 16 mg/dL (ref 6–20)
CO2: 35 mmol/L — ABNORMAL HIGH (ref 22–32)
Calcium: 7.9 mg/dL — ABNORMAL LOW (ref 8.9–10.3)
Chloride: 92 mmol/L — ABNORMAL LOW (ref 98–111)
Creatinine, Ser: 0.41 mg/dL — ABNORMAL LOW (ref 0.44–1.00)
GFR, Estimated: >60 mL/min (ref >60)
Glucose, Bld: 154 mg/dL — ABNORMAL HIGH (ref 70–99)
Potassium: 3.5 mmol/L (ref 3.5–5.1)
Sodium: 137 mmol/L (ref 135–145)

## 2022-05-02 LAB — HEPATIC FUNCTION PANEL
ALT: 31 U/L (ref 0–44)
AST: 20 U/L (ref 15–41)
Albumin: 1.8 g/dL — ABNORMAL LOW (ref 3.5–5.0)
Alkaline Phosphatase: 225 U/L — ABNORMAL HIGH (ref 38–126)
Bilirubin, Direct: 0.2 mg/dL (ref 0.0–0.2)
Indirect Bilirubin: 0.4 mg/dL (ref 0.3–0.9)
Total Bilirubin: 0.6 mg/dL (ref 0.3–1.2)
Total Protein: 5.1 g/dL — ABNORMAL LOW (ref 6.5–8.1)

## 2022-05-02 LAB — MAGNESIUM: Magnesium: 1.9 mg/dL (ref 1.7–2.4)

## 2022-05-02 MED ORDER — INSULIN ASPART 100 UNIT/ML IJ SOLN
5.0000 [IU] | Freq: Three times a day (TID) | INTRAMUSCULAR | Status: DC
  Administered 2022-05-03 – 2022-05-04 (×3): 5 [IU] via SUBCUTANEOUS

## 2022-05-02 MED ORDER — BISOPROLOL FUMARATE 5 MG PO TABS
2.5000 mg | ORAL_TABLET | Freq: Every day | ORAL | Status: DC
Start: 2022-05-02 — End: 2022-05-05
  Administered 2022-05-02 – 2022-05-04 (×3): 2.5 mg via ORAL
  Filled 2022-05-02 (×3): qty 1

## 2022-05-02 MED ORDER — FUROSEMIDE 40 MG PO TABS
40.0000 mg | ORAL_TABLET | Freq: Every day | ORAL | Status: DC
  Administered 2022-05-03 – 2022-05-04 (×2): 40 mg via ORAL
  Filled 2022-05-02 (×2): qty 1

## 2022-05-02 MED ORDER — SPIRONOLACTONE 12.5 MG HALF TABLET
12.5000 mg | ORAL_TABLET | Freq: Every day | ORAL | Status: DC
Start: 2022-05-02 — End: 2022-05-05
  Administered 2022-05-03 – 2022-05-04 (×2): 12.5 mg via ORAL
  Filled 2022-05-02 (×7): qty 1

## 2022-05-02 NOTE — Progress Notes (Signed)
PROGRESS NOTE  Blaine Guiffre QQV:[id number] DOB: 06/20/1971 DOA: 04/24/2022 PCP: [place], MD  Brief History:  Kelli [name] is a 51 y.o. female with medical history significant of prior history of asthma (per patient not a big issue lately and has never been using her home inhaler), type 2 diabetes mellitus, depression, gastroesophageal reflux disease, hyperlipidemia class II obesity; who presented to the emergency department secondary to productive coughing spells, shortness of breath, general malaise and dysuria.  Patient reports symptom has been present for the last 3-4 days and worsening.  She has noticed that in the last 24 hours she having very winded with minimal activity and just feeling completely drained.  Reports productive coughing spells (yellowish/greenish mucus, no hemoptysis), expressed being exposed to her grandkids (with URI symptoms) reported experiencing chills. Patient denies chest pain, no nausea or vomiting, pain, no hematuria, no hematochezia, no melena, no focal weakness, headache or any other complaints.   In the ED chest x-ray demonstrated multilobar pneumonia affecting her left lung, she was leukopenic, tachypneic, tachycardic and hypoxic.  Lactic acid 4.2.  Cultures were taken, fluid resuscitation per sepsis protocol initiated and IV antibiotics started.  TRH contacted to place patient in the hospital for further evaluation and management.   Of note, COVID/influenza PCR negative.      Assessment and Plan: * Acute respiratory failure with hypoxia (HCC) -In the setting of multilobar pneumonia (left Lung) -Patient oxygen saturation in the mid 80s on room air; -need of 8 L high flow nasal cannula to maintain saturation above 92%. -now up to 12 L>>9L>>3L -finished 5 days ceftriaxone/zithromas -Patient reporting difficulty swallowing for over 2 weeks and with high concern for aspiration.   Speech therapy has seen patient and at this moment  recommendation for GI involvement -repeat BNP--113 -repeat PCT--1.31 -IS and flutter valve 4/20--repeat CXR--personally reviewed>>increase RLL opacity 5/21 CTA chest--No PE--Diffuse patchy and confluent ground-glass opacities throughout both lungs  Severe sepsis ([place]) -Meeting severe sepsis criteria at time of admission with leukopenia, fever, tachycardia, presence of lactic acidosis (lactic acid 4.2), increased respiratory rate and the presence of hypoxia as part of organ dysfunction. -Lactic acid peaked 4.2 -Patient ended requiring up to 8 L high flow>>12L>>3L -finished 5 days ceftriaxone and azithro and 2 days zosyn (7 days total abx) -sepsis physiology resolved   Lobar pneumonia ([place]) 5/18 CXR--personally reviewed--increase vascular congestion, increase R-opacities Check PCT--4.24>>2.07>>1.31 Finished 5 days ceftriaxone/azithro -finished 5 days ceftriaxone and azithro and 2 days zosyn (7 days total abx) BNP--1360>>113 5/21 CTA chest--No PE--Diffuse patchy and confluent ground-glass opacities throughout both lungs  Acute systolic CHF (congestive heart failure) ([place]) 5/18--personally reviewed CXR--increase vascular congestion and R-opacities 5/18 Echo--EF 40-45%, low normal RV; mild MR/TR Continue lasix 40 IV bid>>po lasix on 5/24 Accurate I/Os--incomplete Hold SGLT due to sepsis Spironolactone added 05/02/22  Chronic diarrhea -In the setting of IBS -Continue the use of Questran and Florastor. -Appears to be stable/improved overall.  Hypomagnesemia repleted  Hypokalemia Replete Check mag--1.9  Dysphagia - Per patient ongoing for over 2 weeks -Affecting liquids and solids in different locations. 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 -EGD planned for 05/03/22 -Dysphagia 1 diet with thin liquids and close supervision has been recommended by speech therapy   Depression -No suicidal ideation or  hallucinations -Continue Lexapro and Wellbutrin.  Hyperlipidemia -Continue statin  Uncontrolled type 2 diabetes mellitus with hyperglycemia, without long-term current use of insulin ([place]) -Patient  with hyperglycemia -04/24/22 A1c 11.5 -Continue sliding scale insulin and Levemir; follow CBGs and adjust hypoglycemia regimen as needed. -Holding oral hypoglycemic agents while inpatient. CBGs largely controlled currently Add novolog 5 units TIW   Family Communication:   mother updated at bedside 5/22   Consultants:  GI   Code Status:  FULL    DVT Prophylaxis:  [place] Heparin      Procedures: As Listed in Progress Note Above   Antibiotics: Ceftriaxone 5/15>>5/19 Azithro 5/15>>5/19 Zosyn 5/20>>5/22    Subjective: Patient denies fevers, chills, headache, chest pain, dyspnea, nausea, vomiting, diarrhea, abdominal pain, dysuria, hematuria, hematochezia, and melena.   Objective: Vitals:   05/02/22 1312 05/02/22 1332 05/02/22 1413 05/02/22 1445  BP: (!) 113/52   114/75  Pulse: 76 76 72 79  Resp: (!) 29 (!) '23 20 20  '$ Temp:    (!) 97.5 F (36.4 C)  TempSrc:    Oral  SpO2: (!) 88% 94% 94% 90%  Weight:      Height:        Intake/Output Summary (Last 24 hours) at 05/02/2022 1707 Last data filed at 05/02/2022 1018 Gross per 24 hour  Intake 337.58 ml  Output 1900 ml  Net -1562.42 ml   Weight change: -1.7 kg Exam:  General:  Pt is alert, follows commands appropriately, not in acute distress HEENT: No icterus, No thrush, No neck mass, [place]/AT Cardiovascular: RRR, S1/S2, no rubs, no gallops Respiratory: bilateral crackles. No wheeze Abdomen: Soft/+BS, non tender, non distended, no guarding Extremities: trace LE edema, No lymphangitis, No petechiae, No rashes, no synovitis   Data Reviewed: I have personally reviewed following labs and imaging studies Basic Metabolic Panel: Recent Labs  Lab 04/26/22 0424 04/27/22 0458 04/28/22 0342 04/29/22 0109 05/01/22 0426  05/02/22 0437  NA 139 140 141 141 136 137  K 3.3* 3.5 3.4* 2.9* 3.3* 3.5  CL 107 104 100 95* 91* 92*  CO2 '28 27 29 '$ 36* 31 35*  GLUCOSE 72 84 164* 184* 126* 154*  BUN '20 18 16 15 17 16  '$ CREATININE 0.49 0.42* 0.40* 0.38* 0.40* 0.41*  CALCIUM 8.2* 8.3* 8.1* 7.9* 7.7* 7.9*  MG 1.9  --  1.6* 1.9 1.9 1.9   Liver Function Tests: Recent Labs  Lab 04/29/22 1616 05/01/22 0426 05/02/22 0437  AST 42* 26 20  ALT 72* 43 31  ALKPHOS 340* 262* 225*  BILITOT 0.6 0.9 0.6  PROT 5.0* 5.2* 5.1*  ALBUMIN 2.0* 1.9* 1.8*   No results for input(s): LIPASE, AMYLASE in the last 168 hours. No results for input(s): AMMONIA in the last 168 hours. Coagulation Profile: No results for input(s): INR, PROTIME in the last 168 hours. CBC: Recent Labs  Lab 04/26/22 0424 04/27/22 0458 04/29/22 1616 04/30/22 0418  WBC 2.9* 3.4* 9.5 10.0  HGB 12.2 12.5 12.8 12.5  HCT 38.5 38.3 37.9 36.9  MCV 104.6* 103.5* 99.5 100.0  PLT 124* 107* 117* 127*   Cardiac Enzymes: No results for input(s): CKTOTAL, CKMB, CKMBINDEX, TROPONINI in the last 168 hours. BNP: Invalid input(s): POCBNP CBG: Recent Labs  Lab 05/01/22 1109 05/01/22 1642 05/01/22 2129 05/02/22 0755 05/02/22 1108  GLUCAP 259* 121* 223* 159* 331*   HbA1C: No results for input(s): HGBA1C in the last 72 hours. Urine analysis:    Component Value Date/Time   COLORURINE AMBER (A) 04/24/2022 0715   APPEARANCEUR CLEAR 04/24/2022 0715   LABSPEC 1.024 04/24/2022 0715   PHURINE 6.0 04/24/2022 0715   GLUCOSEU >=500 (A) 04/24/2022 0715  HGBUR NEGATIVE 04/24/2022 0715   BILIRUBINUR NEGATIVE 04/24/2022 0715   BILIRUBINUR n 09/11/2016 1605   KETONESUR 5 (A) 04/24/2022 0715   PROTEINUR NEGATIVE 04/24/2022 0715   UROBILINOGEN negative 09/11/2016 1605   UROBILINOGEN 0.2 12/24/2007 2234   NITRITE POSITIVE (A) 04/24/2022 0715   LEUKOCYTESUR NEGATIVE 04/24/2022 0715   Sepsis Labs: '@LABRCNTIP'$ (procalcitonin:4,lacticidven:4) ) Recent Results (from the  past 240 hour(s))  Resp Panel by RT-PCR (Flu A&B, Covid) Nasopharyngeal Swab     Status: None   Collection Time: 04/24/22  7:25 AM   Specimen: Nasopharyngeal Swab; Nasopharyngeal(NP) swabs in vial transport medium  Result Value Ref Range Status   SARS Coronavirus 2 by RT PCR NEGATIVE NEGATIVE Final    Comment: (NOTE) SARS-CoV-2 target nucleic acids are NOT DETECTED.  The SARS-CoV-2 RNA is generally detectable in upper respiratory specimens during the acute phase of infection. The lowest concentration of SARS-CoV-2 viral copies this assay can detect is 138 copies/mL. A negative result does not preclude SARS-Cov-2 infection and should not be used as the sole basis for treatment or other patient management decisions. A negative result may occur with  improper specimen collection/handling, submission of specimen other than nasopharyngeal swab, presence of viral mutation(s) within the areas targeted by this assay, and inadequate number of viral copies(<138 copies/mL). A negative result must be combined with clinical observations, patient history, and epidemiological information. The expected result is Negative.  Fact Sheet for Patients:  EntrepreneurPulse.com.au  Fact Sheet for Healthcare Providers:  IncredibleEmployment.be  This test is no t yet approved or cleared by the [place] FDA and  has been authorized for detection and/or diagnosis of SARS-CoV-2 by FDA under an Emergency Use Authorization (EUA). This EUA will remain  in effect (meaning this test can be used) for the duration of the COVID-19 declaration under Section 564(b)(1) of the Act, 21 U.S.C.section 360bbb-3(b)(1), unless the authorization is terminated  or revoked sooner.       Influenza A by PCR NEGATIVE NEGATIVE Final   Influenza B by PCR NEGATIVE NEGATIVE Final    Comment: (NOTE) The Xpert Xpress SARS-CoV-2/FLU/RSV plus assay is intended as an aid in the diagnosis of  influenza from Nasopharyngeal swab specimens and should not be used as a sole basis for treatment. Nasal washings and aspirates are unacceptable for Xpert Xpress SARS-CoV-2/FLU/RSV testing.  Fact Sheet for Patients: EntrepreneurPulse.com.au  Fact Sheet for Healthcare Providers: IncredibleEmployment.be  This test is not yet approved or cleared by the [place] FDA and has been authorized for detection and/or diagnosis of SARS-CoV-2 by FDA under an Emergency Use Authorization (EUA). This EUA will remain in effect (meaning this test can be used) for the duration of the COVID-19 declaration under Section 564(b)(1) of the Act, 21 U.S.C. section 360bbb-3(b)(1), unless the authorization is terminated or revoked.  Performed at [facility], [address], [place], [place] [address]   Urine Culture     Status: Abnormal   Collection Time: 04/24/22  7:33 AM   Specimen: Urine, Catheterized  Result Value Ref Range Status   Specimen Description   Final    URINE, CATHETERIZED Performed at [facility], [address], [place], [place] [address]    Special Requests   Final    NONE Performed at [facility], [address] [place], [place] [address]    Culture >=100,000 COLONIES/mL ESCHERICHIA COLI (A)  Final   Report Status 04/26/2022 FINAL  Final   Organism ID, Bacteria ESCHERICHIA COLI (A)  Final  Susceptibility   Escherichia coli - MIC*    AMPICILLIN >=32 RESISTANT Resistant     CEFAZOLIN <=4 SENSITIVE Sensitive     CEFEPIME <=0.12 SENSITIVE Sensitive     CEFTRIAXONE <=0.25 SENSITIVE Sensitive     CIPROFLOXACIN >=4 RESISTANT Resistant     GENTAMICIN <=1 SENSITIVE Sensitive     IMIPENEM <=0.25 SENSITIVE Sensitive     NITROFURANTOIN <=16 SENSITIVE Sensitive     TRIMETH/SULFA <=20 SENSITIVE Sensitive     AMPICILLIN/SULBACTAM 16 INTERMEDIATE Intermediate     PIP/TAZO <=4 SENSITIVE Sensitive     * >=100,000 COLONIES/mL ESCHERICHIA COLI  Blood  Culture (routine x 2)     Status: None   Collection Time: 04/24/22  7:57 AM   Specimen: BLOOD LEFT HAND  Result Value Ref Range Status   Specimen Description BLOOD LEFT HAND  Final   Special Requests   Final    BOTTLES DRAWN AEROBIC AND ANAEROBIC Blood Culture results may not be optimal due to an inadequate volume of blood received in culture bottles   Culture   Final    NO GROWTH 5 DAYS Performed at [facility], [address] [place], [place] [address]    Report Status 04/29/2022 FINAL  Final  Blood Culture (routine x 2)     Status: None   Collection Time: 04/24/22  7:57 AM   Specimen: BLOOD RIGHT WRIST  Result Value Ref Range Status   Specimen Description BLOOD RIGHT WRIST  Final   Special Requests   Final    BOTTLES DRAWN AEROBIC AND ANAEROBIC Blood Culture adequate volume   Culture   Final    NO GROWTH 5 DAYS Performed at [facility], [address], [place], [place] [address]    Report Status 04/29/2022 FINAL  Final  MRSA Next Gen by PCR, Nasal     Status: None   Collection Time: 04/24/22 10:54 AM   Specimen: Urine, Clean Catch; Nasal Swab  Result Value Ref Range Status   MRSA by PCR Next Gen NOT DETECTED NOT DETECTED Final    Comment: (NOTE) The GeneXpert MRSA Assay (FDA approved for NASAL specimens only), is one component of a comprehensive MRSA colonization surveillance program. It is not intended to diagnose MRSA infection nor to guide or monitor treatment for MRSA infections. Test performance is not FDA approved in patients less than 61 years old. Performed at [facility], [address] [place], [place] [address]      Scheduled Meds:  bisoprolol  2.5 mg Oral Daily   budesonide (PULMICORT) nebulizer solution  0.5 mg Nebulization BID   buPROPion  150 mg Oral Daily   chlorhexidine  15 mL Mouth Rinse BID   Chlorhexidine Gluconate Cloth  6 each Topical Daily   dextromethorphan-guaiFENesin  1 tablet Oral BID   escitalopram  10 mg Oral Daily   [START ON  05/03/2022] furosemide  40 mg Oral Daily   heparin injection (subcutaneous)  5,000 Units Subcutaneous Q8H   insulin aspart  0-15 Units Subcutaneous TID WC   insulin aspart  0-5 Units Subcutaneous QHS   [START ON 05/03/2022] insulin aspart  5 Units Subcutaneous TID WC   insulin detemir  15 Units Subcutaneous Daily   ipratropium  0.5 mg Nebulization Q6H   levalbuterol  0.63 mg Nebulization Q6H   mouth rinse  15 mL Mouth Rinse q12n4p   pantoprazole  40 mg Oral Daily   polyethylene glycol  17 g Oral Daily   potassium chloride  40 mEq Oral Daily  rosuvastatin  5 mg Oral Daily   saccharomyces boulardii  250 mg Oral BID   spironolactone  12.5 mg Oral Daily   Continuous Infusions:  Procedures/Studies: CT Angio Chest Pulmonary Embolism (PE) W or WO Contrast  Result Date: 04/30/2022 CLINICAL DATA:  High probability for PE. Pleuritic chest pain, hypoxia and dyspnea. EXAM: CT ANGIOGRAPHY CHEST WITH CONTRAST TECHNIQUE: Multidetector CT imaging of the chest was performed using the standard protocol during bolus administration of intravenous contrast. Multiplanar CT image reconstructions and MIPs were obtained to evaluate the vascular anatomy. RADIATION DOSE REDUCTION: This exam was performed according to the departmental dose-optimization program which includes automated exposure control, adjustment of the mA and/or kV according to patient size and/or use of iterative reconstruction technique. CONTRAST:  32m OMNIPAQUE IOHEXOL 350 MG/ML SOLN COMPARISON:  None Available. FINDINGS: Cardiovascular: Satisfactory opacification of the pulmonary arteries to the segmental level. No evidence of pulmonary embolism. Normal heart size. No pericardial effusion. Mediastinum/Nodes: No enlarged mediastinal, hilar, or axillary lymph nodes. Thyroid gland, trachea, and esophagus demonstrate no significan seen. T findings. Lungs/Pleura: There are patchy and confluent ground-glass opacities throughout both lungs most significant  in the upper lobes. Airspace consolidation is seen in the lingula and left lower lobe with air bronchograms. There are trace bilateral pleural effusions. No pneumothorax. Trachea and central airways are patent. Upper Abdomen: Cholecystectomy clips are present. Musculoskeletal: Degenerative changes affect the spine. No acute fractures are Review of the MIP images confirms the above findings. IMPRESSION: 1. No evidence for pulmonary embolism. 2. Diffuse patchy and confluent ground-glass opacities throughout both lungs. Findings are nonspecific and may be related to pulmonary edema, hemorrhage or infection. 3. Lingular and left lower lobe airspace consolidation compatible with pneumonia. 4. Trace bilateral pleural effusions. Electronically Signed   By: ARonney AstersM.D.   On: 04/30/2022 16:25   [place]oreaAbdomen Complete  Result Date: 04/30/2022 CLINICAL DATA:  Abnormal LFTs.  Cholecystectomy. EXAM: ABDOMEN ULTRASOUND COMPLETE COMPARISON:  None Available. FINDINGS: Gallbladder: Surgically absent Common bile duct: Diameter: 8 mm Liver: Increased echogenicity throughout the liver. No focal mass. Portal vein is patent on color Doppler imaging with normal direction of blood flow towards the liver. IVC: No abnormality visualized. Pancreas: Visualized portion unremarkable. Spleen: Size and appearance within normal limits. Right Kidney: Length: 11.3 cm. Echogenicity within normal limits. No mass or hydronephrosis visualized. Left Kidney: Length: 12.8 cm. Echogenicity within normal limits. No mass or hydronephrosis visualized. Abdominal aorta: No aneurysm identified within visualize limits. The distal aorta was obscured by bowel gas. Other findings: None. IMPRESSION: 1. Previous cholecystectomy. 2. The common bile duct measures 8 mm which is within normal limits after cholecystectomy. 3. Probable hepatic steatosis. 4. No other significant abnormalities. Electronically Signed   By: DDorise BullionIII M.D.   On: 04/30/2022 09:28    UKoreaVenous Img Lower Bilateral (DVT)  Result Date: 04/27/2022 CLINICAL DATA:  Bilateral lower extremity pain and swelling EXAM: BILATERAL LOWER EXTREMITY VENOUS DOPPLER ULTRASOUND TECHNIQUE: Gray-scale sonography with graded compression, as well as color Doppler and duplex ultrasound were performed to evaluate the lower extremity deep venous systems from the level of the common femoral vein and including the common femoral, femoral, profunda femoral, popliteal and calf veins including the posterior tibial, peroneal and gastrocnemius veins when visible. The superficial great saphenous vein was also interrogated. Spectral Doppler was utilized to evaluate flow at rest and with distal augmentation maneuvers in the common femoral, femoral and popliteal veins. COMPARISON:  None Available. FINDINGS: RIGHT LOWER EXTREMITY  Common Femoral Vein: No evidence of thrombus. Normal compressibility, respiratory phasicity and response to augmentation. Saphenofemoral Junction: No evidence of thrombus. Normal compressibility and flow on color Doppler imaging. Profunda Femoral Vein: No evidence of thrombus. Normal compressibility and flow on color Doppler imaging. Femoral Vein: No evidence of thrombus. Normal compressibility, respiratory phasicity and response to augmentation. Popliteal Vein: No evidence of thrombus. Normal compressibility, respiratory phasicity and response to augmentation. Calf Veins: No evidence of thrombus. Normal compressibility and flow on color Doppler imaging. Superficial Great Saphenous Vein: No evidence of thrombus. Normal compressibility. Venous Reflux:  None. Other Findings:  None. LEFT LOWER EXTREMITY Common Femoral Vein: No evidence of thrombus. Normal compressibility, respiratory phasicity and response to augmentation. Saphenofemoral Junction: No evidence of thrombus. Normal compressibility and flow on color Doppler imaging. Profunda Femoral Vein: No evidence of thrombus. Normal compressibility and  flow on color Doppler imaging. Femoral Vein: No evidence of thrombus. Normal compressibility, respiratory phasicity and response to augmentation. Popliteal Vein: No evidence of thrombus. Normal compressibility, respiratory phasicity and response to augmentation. Calf Veins: No evidence of thrombus. Normal compressibility and flow on color Doppler imaging. Superficial Great Saphenous Vein: No evidence of thrombus. Normal compressibility. Venous Reflux:  None. Other Findings:  None. IMPRESSION: No evidence of deep venous thrombosis in either lower extremity. Electronically Signed   By: Jacqulynn Cadet M.D.   On: 04/27/2022 10:33   DG CHEST PORT 1 VIEW  Result Date: 04/29/2022 CLINICAL DATA:  Respiratory distress EXAM: PORTABLE CHEST 1 VIEW COMPARISON:  04/27/2022 FINDINGS: Stable cardiomediastinal contours. Extensive airspace opacity throughout the left lung with minimally improved aeration from prior. Worsening airspace consolidation in the right mid lung. Possible small left effusion. No pneumothorax. IMPRESSION: 1. Worsening airspace consolidation in the right mid lung. 2. Extensive airspace opacity throughout the left lung with slightly improved aeration compared to prior. Electronically Signed   By: Davina Poke D.O.   On: 04/29/2022 15:01   DG CHEST PORT 1 VIEW  Result Date: 04/27/2022 CLINICAL DATA:  Follow-up lobar pneumonia. EXAM: PORTABLE CHEST 1 VIEW COMPARISON:  Chest x-ray May 15, 23. FINDINGS: Increasing left greater than right airspace opacities. Airspace opacities are confluent in the left lung and patchy in the right lung. No visible pleural effusions or pneumothorax. Cardiac silhouette is largely obscured. No displaced fracture. IMPRESSION: Increasing left greater than right airspace opacities, concerning for worsening pneumonia. These results will be called to the ordering clinician or representative by the Radiologist Assistant, and communication documented in the PACS or [employer]. Electronically Signed   By: Margaretha Sheffield M.D.   On: 04/27/2022 08:07   DG Chest Port 1 View  Result Date: 04/24/2022 CLINICAL DATA:  Short of breath EXAM: PORTABLE CHEST 1 VIEW COMPARISON:  12/14/2020 FINDINGS: New consolidative opacities in the left lung. No pleural effusion. No pneumothorax. Heart size is normal. IMPRESSION: Multifocal left-sided pneumonia. Electronically Signed   By: Macy Mis M.D.   On: 04/24/2022 07:46   ECHOCARDIOGRAM COMPLETE  Result Date: 04/27/2022    ECHOCARDIOGRAM REPORT   Patient Name:   LANIER FELTY Date of Exam: 04/27/2022 Medical Rec #:  [id number]        Height:       64.0 in Accession #:    [id number]       Weight:       232.1 lb Date of Birth:  September 14, 1971         BSA:          2.084 m Patient  Age:    31 years         BP:           133/65 mmHg Patient Gender: F                HR:           86 bpm. Exam Location:  [facility] Procedure: 2D Echo, Cardiac Doppler and Color Doppler Indications:    CHF  History:        Patient has no prior history of Echocardiogram examinations.                 CHF; Risk Factors:Hypertension, Diabetes and Dyslipidemia.  Sonographer:    Wenda Low Referring Phys: [phone] Kaelene Elliston IMPRESSIONS  1. Left ventricular ejection fraction, by estimation, is 40 to 45%. The left ventricle has mildly decreased function. The left ventricle demonstrates global hypokinesis. The left ventricular internal cavity size was mildly dilated.  2. Right ventricular systolic function is low normal. The right ventricular size is normal. There is mildly elevated pulmonary artery systolic pressure.  3. The mitral valve is normal in structure. Mild mitral valve regurgitation.  4. The aortic valve is tricuspid. Aortic valve regurgitation is not visualized. Aortic valve sclerosis is present, with no evidence of aortic valve stenosis.  5. The inferior vena cava is dilated in size with <50% respiratory variability, suggesting right atrial pressure of 15  mmHg. FINDINGS  Left Ventricle: Left ventricular ejection fraction, by estimation, is 40 to 45%. The left ventricle has mildly decreased function. The left ventricle demonstrates global hypokinesis. The left ventricular internal cavity size was mildly dilated. There is  no left ventricular hypertrophy. Right Ventricle: The right ventricular size is normal. Right vetricular wall thickness was not assessed. Right ventricular systolic function is low normal. There is mildly elevated pulmonary artery systolic pressure. The tricuspid regurgitant velocity is  2.42 m/s, and with an assumed right atrial pressure of 15 mmHg, the estimated right ventricular systolic pressure is 41.3 mmHg. Left Atrium: Left atrial size was normal in size. Right Atrium: Right atrial size was normal in size. Pericardium: There is no evidence of pericardial effusion. Mitral Valve: The mitral valve is normal in structure. Mild mitral valve regurgitation. MV peak gradient, 3.8 mmHg. The mean mitral valve gradient is 2.0 mmHg. Tricuspid Valve: The tricuspid valve is normal in structure. Tricuspid valve regurgitation is mild. Aortic Valve: The aortic valve is tricuspid. Aortic valve regurgitation is not visualized. Aortic valve sclerosis is present, with no evidence of aortic valve stenosis. Aortic valve mean gradient measures 4.0 mmHg. Aortic valve peak gradient measures 6.9  mmHg. Aortic valve area, by VTI measures 2.16 cm. Pulmonic Valve: The pulmonic valve was grossly normal. Pulmonic valve regurgitation is not visualized. Aorta: The aortic root is normal in size and structure. Venous: The inferior vena cava is dilated in size with less than 50% respiratory variability, suggesting right atrial pressure of 15 mmHg. IAS/Shunts: No atrial level shunt detected by color flow Doppler.  LEFT VENTRICLE PLAX 2D LVIDd:         5.20 cm     Diastology LVIDs:         3.65 cm     LV e' medial:    7.94 cm/s LV PW:         0.80 cm     LV E/e' medial:  8.7 LV  IVS:        1.00 cm     LV e' lateral:  14.90 cm/s LVOT diam:     2.00 cm     LV E/e' lateral: 4.6 LV SV:         57 LV SV Index:   27 LVOT Area:     3.14 cm  LV Volumes (MOD) LV vol d, MOD A2C: 94.9 ml LV vol d, MOD A4C: 81.3 ml LV vol s, MOD A2C: 48.9 ml LV vol s, MOD A4C: 42.9 ml LV SV MOD A2C:     46.0 ml LV SV MOD A4C:     81.3 ml LV SV MOD BP:      42.0 ml RIGHT VENTRICLE RV Basal diam:  3.55 cm RV Mid diam:    3.70 cm RV S prime:     12.90 cm/s TAPSE (M-mode): 2.4 cm LEFT ATRIUM             Index        RIGHT ATRIUM           Index LA diam:        4.30 cm 2.06 cm/m   RA Area:     15.70 cm LA Vol (A2C):   53.4 ml 25.62 ml/m  RA Volume:   44.80 ml  21.49 ml/m LA Vol (A4C):   55.1 ml 26.44 ml/m LA Biplane Vol: 58.7 ml 28.16 ml/m  AORTIC VALVE                    PULMONIC VALVE AV Area (Vmax):    2.27 cm     PV Vmax:       0.66 m/s AV Area (Vmean):   1.99 cm     PV Peak grad:  1.7 mmHg AV Area (VTI):     2.16 cm AV Vmax:           131.00 cm/s AV Vmean:          90.500 cm/s AV VTI:            0.262 m AV Peak Grad:      6.9 mmHg AV Mean Grad:      4.0 mmHg LVOT Vmax:         94.60 cm/s LVOT Vmean:        57.300 cm/s LVOT VTI:          0.180 m LVOT/AV VTI ratio: 0.69  AORTA Ao Root diam: 2.70 cm MITRAL VALVE               TRICUSPID VALVE MV Area (PHT): 4.17 cm    TR Peak grad:   23.4 mmHg MV Area VTI:   2.46 cm    TR Vmax:        242.00 cm/s MV Peak grad:  3.8 mmHg MV Mean grad:  2.0 mmHg    SHUNTS MV Vmax:       0.98 m/s    Systemic VTI:  0.18 m MV Vmean:      61.6 cm/s   Systemic Diam: 2.00 cm MV Decel Time: 182 msec MV E velocity: 69.00 cm/s MV A velocity: 77.60 cm/s MV E/A ratio:  0.89 Dorris Carnes MD Electronically signed by Dorris Carnes MD Signature Date/Time: 04/27/2022/10:03:09 PM    Final    DG ESOPHAGUS W DOUBLE CM (HD)  Result Date: 04/25/2022 CLINICAL DATA:  Dysphagia, feels like foods are sticking in her chest and lower cervical region, pneumonia EXAM: ESOPHOGRAM / BARIUM SWALLOW / BARIUM  TABLET STUDY TECHNIQUE: Combined double contrast and single contrast examination performed using effervescent  crystals, thick barium liquid, and thin barium liquid. The patient was observed with fluoroscopy swallowing a 13 mm barium sulphate tablet. FLUOROSCOPY: Radiation Exposure Index (as provided by the fluoroscopic device): 57.0 mGy Kerma COMPARISON:  None Available. FINDINGS: Esophageal distention: Smooth narrowing at/just above gastroesophageal junction. Remainder of esophagus appears to distend normally. Filling defects:  No filling defects 12.5 mm barium tablet: Obstructed at the GE junction and did not pass into stomach despite multiple swallows of water and thick barium. 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 No residuals. 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 Electronically Signed   By: Lavonia Dana M.D.   On: 04/25/2022 10:59    Orson Eva, DO  [facility]  If 7PM-7AM, please contact night-coverage [email or web address] Password [email or web address] 05/02/2022, 5:07 PM   LOS: 8 days

## 2022-05-02 NOTE — Progress Notes (Signed)
Inpatient Diabetes Program Recommendations  AACE/ADA: New Consensus Statement on Inpatient Glycemic Control  Target Ranges:  Prepandial:   less than 140 mg/dL      Peak postprandial:   less than 180 mg/dL (1-2 hours)      Critically ill patients:  140 - 180 mg/dL    Latest Reference Range & Units 05/01/22 07:35 05/01/22 11:09 05/01/22 16:42 05/01/22 21:29 05/02/22 07:55  Glucose-Capillary 70 - 99 mg/dL 151 (H) 259 (H) 121 (H) 223 (H) 159 (H)   Review of Glycemic Control  Diabetes history: DM2 Outpatient Diabetes medications: Metformin 500 mg BID Current orders for Inpatient glycemic control: Levemir 15 units daily, Novolog 0-15 units TID with meals, Novolog 0-5 units QHS, Novolog 3 units TID with meals   Inpatient Diabetes Program Recommendations:     Insulin: May want to consider increasing meal coverage to Novolog 5 units TID with meals.   Thanks, Barnie Alderman, RN, MSN, CDE Diabetes Coordinator Inpatient Diabetes Program [phone] (Team Pager from 8am to 5pm)

## 2022-05-02 NOTE — Progress Notes (Addendum)
Progress Note  Patient Name: Kelli [name] Date of Encounter: 05/02/2022  [facility] Cardiologist: Werner Lean, MD    Subjective   Feeling much better.  Inpatient Medications    Scheduled Meds:  budesonide (PULMICORT) nebulizer solution  0.5 mg Nebulization BID   buPROPion  150 mg Oral Daily   chlorhexidine  15 mL Mouth Rinse BID   Chlorhexidine Gluconate Cloth  6 each Topical Daily   dextromethorphan-guaiFENesin  1 tablet Oral BID   escitalopram  10 mg Oral Daily   furosemide  40 mg Intravenous BID   heparin injection (subcutaneous)  5,000 Units Subcutaneous Q8H   insulin aspart  0-15 Units Subcutaneous TID WC   insulin aspart  0-5 Units Subcutaneous QHS   insulin aspart  3 Units Subcutaneous TID WC   insulin detemir  15 Units Subcutaneous Daily   ipratropium  0.5 mg Nebulization Q6H   levalbuterol  0.63 mg Nebulization Q6H   mouth rinse  15 mL Mouth Rinse q12n4p   pantoprazole  40 mg Oral Daily   polyethylene glycol  17 g Oral Daily   potassium chloride  40 mEq Oral Daily   rosuvastatin  5 mg Oral Daily   saccharomyces boulardii  250 mg Oral BID    PRN Meds: acetaminophen **OR** acetaminophen, ondansetron **OR** ondansetron (ZOFRAN) IV, sodium chloride, SUMAtriptan   Vital Signs    Vitals:   05/02/22 0751 05/02/22 0755 05/02/22 0800 05/02/22 0900  BP:   (!) 132/59   Pulse:  77 76   Resp:  (!) 28 18   Temp:    98.4 F (36.9 C)  TempSrc:    Axillary  SpO2: 94% 98% 98%   Weight:      Height:        Intake/Output Summary (Last 24 hours) at 05/02/2022 1011 Last data filed at 05/02/2022 0900 Gross per 24 hour  Intake 387.58 ml  Output 1900 ml  Net -1512.42 ml      05/02/2022    5:47 AM 05/01/2022    6:07 AM 05/01/2022    5:00 AM  Last 3 Weights  Weight (lbs) 207 lb 3.7 oz 204 lb 10.1 oz 210 lb 15.7 oz  Weight (kg) 94 kg 92.82 kg 95.7 kg      Telemetry    NSR with PVC's - Personally Reviewed  ECG    No ECG reviewed today.    Physical Exam    GEN: No acute distress.   Neck: No JVD Cardiac: RRR, no murmurs, rubs, or gallops.  Respiratory: bibasilar crackles but better than yest GI: Soft, nontender, non-distended  MS: No edema; No deformity. Neuro:  Nonfocal  Psych: Normal affect   Labs    High Sensitivity Troponin:   Recent Labs  Lab 04/24/22 0735 04/24/22 0921  TROPONINIHS 5 4     Chemistry Recent Labs  Lab 04/29/22 0109 04/29/22 1616 05/01/22 0426 05/02/22 0437  NA 141  --  136 137  K 2.9*  --  3.3* 3.5  CL 95*  --  91* 92*  CO2 36*  --  31 35*  GLUCOSE 184*  --  126* 154*  BUN 15  --  17 16  CREATININE 0.38*  --  0.40* 0.41*  CALCIUM 7.9*  --  7.7* 7.9*  MG 1.9  --  1.9 1.9  PROT  --  5.0* 5.2*  --   ALBUMIN  --  2.0* 1.9*  --   AST  --  42* 26  --  ALT  --  72* 43  --   ALKPHOS  --  340* 262*  --   BILITOT  --  0.6 0.9  --   GFRNONAA >60  --  >60 >60  ANIONGAP 10  --  14 10     Hematology Recent Labs  Lab 04/27/22 0458 04/29/22 1616 04/30/22 0418  WBC 3.4* 9.5 10.0  RBC 3.70* 3.81* 3.69*  HGB 12.5 12.8 12.5  HCT 38.3 37.9 36.9  MCV 103.5* 99.5 100.0  MCH 33.8 33.6 33.9  MCHC 32.6 33.8 33.9  RDW 13.5 13.4 13.4  PLT 107* 117* 127*    BNP Recent Labs  Lab 04/27/22 0458 04/29/22 1616  BNP 1,360.0* 113.0*    DDimer  Recent Labs  Lab 04/29/22 1720  DDIMER 2.94*     Radiology    CT Angio Chest Pulmonary Embolism (PE) W or WO Contrast  Result Date: 04/30/2022 CLINICAL DATA:  High probability for PE. Pleuritic chest pain, hypoxia and dyspnea. EXAM: CT ANGIOGRAPHY CHEST WITH CONTRAST TECHNIQUE: Multidetector CT imaging of the chest was performed using the standard protocol during bolus administration of intravenous contrast. Multiplanar CT image reconstructions and MIPs were obtained to evaluate the vascular anatomy. RADIATION DOSE REDUCTION: This exam was performed according to the departmental dose-optimization program which includes automated exposure  control, adjustment of the mA and/or kV according to patient size and/or use of iterative reconstruction technique. CONTRAST:  29m OMNIPAQUE IOHEXOL 350 MG/ML SOLN COMPARISON:  None Available. FINDINGS: Cardiovascular: Satisfactory opacification of the pulmonary arteries to the segmental level. No evidence of pulmonary embolism. Normal heart size. No pericardial effusion. Mediastinum/Nodes: No enlarged mediastinal, hilar, or axillary lymph nodes. Thyroid gland, trachea, and esophagus demonstrate no significan seen. T findings. Lungs/Pleura: There are patchy and confluent ground-glass opacities throughout both lungs most significant in the upper lobes. Airspace consolidation is seen in the lingula and left lower lobe with air bronchograms. There are trace bilateral pleural effusions. No pneumothorax. Trachea and central airways are patent. Upper Abdomen: Cholecystectomy clips are present. Musculoskeletal: Degenerative changes affect the spine. No acute fractures are Review of the MIP images confirms the above findings. IMPRESSION: 1. No evidence for pulmonary embolism. 2. Diffuse patchy and confluent ground-glass opacities throughout both lungs. Findings are nonspecific and may be related to pulmonary edema, hemorrhage or infection. 3. Lingular and left lower lobe airspace consolidation compatible with pneumonia. 4. Trace bilateral pleural effusions. Electronically Signed   By: ARonney AstersM.D.   On: 04/30/2022 16:25    Cardiac Studies   ECHOCARDIOGRAM COMPLETE   Result Date: 04/27/2022    ECHOCARDIOGRAM REPORT   Patient Name:   Kelli CILIADate of Exam: 04/27/2022 Medical Rec #:  [id number]       Height:       64.0 in Accession #:    [id number]      Weight:       232.1 lb Date of Birth:  [birth year]        BSA:          2.084 m Patient Age:    51years         BP:           133/65 mmHg Patient Gender: F                HR:           86 bpm. [facility]am [facility]  [facility]: 2D Echo, Cardiac Doppler  and Color Doppler Indications:  CHF  History:        Patient has no prior history of Echocardiogram examinations.                 CHF; Risk Factors:Hypertension, Diabetes and Dyslipidemia.  Sonographer:    Wenda Low Referring Phys: [phone] DAVID TAT IMPRESSIONS  1. Left ventricular ejection fraction, by estimation, is 40 to 45%. The left ventricle has mildly decreased function. The left ventricle demonstrates global hypokinesis. The left ventricular internal cavity size was mildly dilated.  2. Right ventricular systolic function is low normal. The right ventricular size is normal. There is mildly elevated pulmonary artery systolic pressure.  3. The mitral valve is normal in structure. Mild mitral valve regurgitation.  4. The aortic valve is tricuspid. Aortic valve regurgitation is not visualized. Aortic valve sclerosis is present, with no evidence of aortic valve stenosis.  5. The inferior vena cava is dilated in size with <50% respiratory variability, suggesting right atrial pressure of 15 mmHg. FINDINGS  Left Ventricle: Left ventricular ejection fraction, by estimation, is 40 to 45%. The left ventricle has mildly decreased function. The left ventricle demonstrates global hypokinesis. The left ventricular internal cavity size was mildly dilated. There is  no left ventricular hypertrophy. Right Ventricle: The right ventricular size is normal. Right vetricular wall thickness was not assessed. Right ventricular systolic function is low normal. There is mildly elevated pulmonary artery systolic pressure. The tricuspid regurgitant velocity is  2.42 m/s, and with an assumed right atrial pressure of 15 mmHg, the estimated right ventricular systolic pressure is 55.7 mmHg. Left Atrium: Left atrial size was normal in size. Right Atrium: Right atrial size was normal in size. Pericardium: There is no evidence of pericardial effusion. Mitral Valve: The mitral valve is normal in structure. Mild mitral valve regurgitation.  MV peak gradient, 3.8 mmHg. The mean mitral valve gradient is 2.0 mmHg. Tricuspid Valve: The tricuspid valve is normal in structure. Tricuspid valve regurgitation is mild. Aortic Valve: The aortic valve is tricuspid. Aortic valve regurgitation is not visualized. Aortic valve sclerosis is present, with no evidence of aortic valve stenosis. Aortic valve mean gradient measures 4.0 mmHg. Aortic valve peak gradient measures 6.9  mmHg. Aortic valve area, by VTI measures 2.16 cm. Pulmonic Valve: The pulmonic valve was grossly normal. Pulmonic valve regurgitation is not visualized. Aorta: The aortic root is normal in size and structure. Venous: The inferior vena cava is dilated in size with less than 50% respiratory variability, suggesting right atrial pressure of 15 mmHg. IAS/Shunts: No atrial level shunt detected by color flow Doppler.  LEFT VENTRICLE PLAX 2D LVIDd:         5.20 cm     Diastology LVIDs:         3.65 cm     LV e' medial:    7.94 cm/s LV PW:         0.80 cm     LV E/e' medial:  8.7 LV IVS:        1.00 cm     LV e' lateral:   14.90 cm/s LVOT diam:     2.00 cm     LV E/e' lateral: 4.6 LV SV:         57 LV SV Index:   27 LVOT Area:     3.14 cm  LV Volumes (MOD) LV vol d, MOD A2C: 94.9 ml LV vol d, MOD A4C: 81.3 ml LV vol s, MOD A2C: 48.9 ml LV vol s, MOD A4C: 42.9 ml  LV SV MOD A2C:     46.0 ml LV SV MOD A4C:     81.3 ml LV SV MOD BP:      42.0 ml RIGHT VENTRICLE RV Basal diam:  3.55 cm RV Mid diam:    3.70 cm RV S prime:     12.90 cm/s TAPSE (M-mode): 2.4 cm LEFT ATRIUM             Index        RIGHT ATRIUM           Index LA diam:        4.30 cm 2.06 cm/m   RA Area:     15.70 cm LA Vol (A2C):   53.4 ml 25.62 ml/m  RA Volume:   44.80 ml  21.49 ml/m LA Vol (A4C):   55.1 ml 26.44 ml/m LA Biplane Vol: 58.7 ml 28.16 ml/m  AORTIC VALVE                    PULMONIC VALVE AV Area (Vmax):    2.27 cm     PV Vmax:       0.66 m/s AV Area (Vmean):   1.99 cm     PV Peak grad:  1.7 mmHg AV Area (VTI):     2.16 cm  AV Vmax:           131.00 cm/s AV Vmean:          90.500 cm/s AV VTI:            0.262 m AV Peak Grad:      6.9 mmHg AV Mean Grad:      4.0 mmHg LVOT Vmax:         94.60 cm/s LVOT Vmean:        57.300 cm/s LVOT VTI:          0.180 m LVOT/AV VTI ratio: 0.69  AORTA Ao Root diam: 2.70 cm MITRAL VALVE               TRICUSPID VALVE MV Area (PHT): 4.17 cm    TR Peak grad:   23.4 mmHg MV Area VTI:   2.46 cm    TR Vmax:        242.00 cm/s MV Peak grad:  3.8 mmHg MV Mean grad:  2.0 mmHg    SHUNTS MV Vmax:       0.98 m/s    Systemic VTI:  0.18 m MV Vmean:      61.6 cm/s   Systemic Diam: 2.00 cm MV Decel Time: 182 msec MV E velocity: 69.00 cm/s MV A velocity: 77.60 cm/s MV E/A ratio:  0.89 Dorris Carnes MD Electronically signed by Dorris Carnes MD Signature Date/Time: 04/27/2022/10:03:09 PM    Final       Patient Profile     51 y.o. female  with HLD with DM, Anemia and diverticulosis, presenting with PNA.  Found to have low EF as part of her evaluation who is being seen 04/28/2022 for the evaluation of HF.  Assessment & Plan    New cardiomyopathy LVEF 40-45% on echo in the setting of sepsis, pneumonia. Multiple CV risk factors, no chest pain.  Plan to repeat echocardiogram as outpatient once recovers, if persistent systolic dysfunction will need ischemic evaluation. . Plan to add losartan and BB prior to discharge    Acute Systolic CHF I/O's negative 12 L since admission, consider transitioning IV lasix 40 mg bid to po    Acute  respiratory failure with hypoxia in setting of multilobar pneumonia left lung- was requiring 12 L O2 but down 3L t CT yest no PE, diffuse patchy and confluent ground glass opacities in both lungs.   Uncontrolled DM2 A1C 11.5   HLD on Crestor   Tobacco abuse-says she will quit       For questions or updates, please contact [place] Please consult [email or web address] for contact info under        Signed, Ermalinda Barrios, PA-C  05/02/2022, 10:11 AM      Attending note:  Chart  reviewed and case discussed with Ms. Bonnell Public PA-C.  Patient has had symptomatic improvement with significant diuresis, nearly 12 L overall.  She has been on Lasix 40 mg IV twice daily which we will switch to oral regimen at this point.  Vital signs reviewed, systolic blood pressure 383-338 range and heart rate in the 70s to 80s in sinus rhythm by telemetry which I personally reviewed.  Pertinent lab work includes potassium 3.5, BUN 16, creatinine 0.41.  Plan to switch to oral Lasix and also start low-dose Aldactone.  GDMT will be limited by blood pressure to some degree.  We will try and initiate low-dose bisoprolol as well.  Would not start SGLT2 inhibitor until diabetes is under better control.  Satira Sark, M.D., F.A.C.C.

## 2022-05-02 NOTE — Progress Notes (Signed)
Report called and given to [place], [place] on Dept. [id number]. Pt to be wheeled up to room 320 via WC.

## 2022-05-02 NOTE — Progress Notes (Signed)
Subjective: Feeling much better overall.  Still having shortness of breath with activity.  She remains on 3 L nasal cannula.  Per nursing staff, O2 saturation dropped to 89% when going to the bathroom.  Patient reports her cough is breaking up.  She is tolerating her diet well without any dysphagia at this time.  Denies nausea, vomiting, abdominal pain.  She did finally have a bowel movement yesterday. No brbpr or melena.   Objective: Vital signs in last 24 hours: Temp:  [97.5 F (36.4 C)-97.7 F (36.5 C)] 97.7 F (36.5 C) (05/23 0547) Pulse Rate:  [74-87] 76 (05/23 0800) Resp:  [16-28] 18 (05/23 0800) BP: (90-132)/(39-77) 132/59 (05/23 0800) SpO2:  [85 %-98 %] 98 % (05/23 0800) Weight:  [94 kg] 94 kg (05/23 0547) Last BM Date : 05/01/22 General:   Alert and oriented, pleasant, NAD, on 3L O2 nasal canula Head:  Normocephalic and atraumatic. Abdomen:  Bowel sounds present, soft, non-tender, non-distended.   Extremities:  Without clubbing or edema. Neurologic:  Alert and  oriented x4;  grossly normal neurologically. Psych:  Normal mood and affect.  Intake/Output from previous day: 05/22 0701 - 05/23 0700 In: 587.6 [P.O.:440; IV Piggyback:147.6] Out: 2100 [Urine:2100] Intake/Output this shift: No intake/output data recorded.  Lab Results: Recent Labs    04/29/22 1616 04/30/22 0418  WBC 9.5 10.0  HGB 12.8 12.5  HCT 37.9 36.9  PLT 117* 127*   BMET Recent Labs    05/01/22 0426 05/02/22 0437  NA 136 137  K 3.3* 3.5  CL 91* 92*  CO2 31 35*  GLUCOSE 126* 154*  BUN 17 16  CREATININE 0.40* 0.41*  CALCIUM 7.7* 7.9*   LFT Recent Labs    04/29/22 1616 05/01/22 0426  PROT 5.0* 5.2*  ALBUMIN 2.0* 1.9*  AST 42* 26  ALT 72* 43  ALKPHOS 340* 262*  BILITOT 0.6 0.9  BILIDIR 0.2 0.4*  IBILI 0.4 0.5    Studies/Results: CT Angio Chest Pulmonary Embolism (PE) W or WO Contrast  Result Date: 04/30/2022 CLINICAL DATA:  High probability for PE. Pleuritic chest pain,  hypoxia and dyspnea. EXAM: CT ANGIOGRAPHY CHEST WITH CONTRAST TECHNIQUE: Multidetector CT imaging of the chest was performed using the standard protocol during bolus administration of intravenous contrast. Multiplanar CT image reconstructions and MIPs were obtained to evaluate the vascular anatomy. RADIATION DOSE REDUCTION: This exam was performed according to the departmental dose-optimization program which includes automated exposure control, adjustment of the mA and/or kV according to patient size and/or use of iterative reconstruction technique. CONTRAST:  41m OMNIPAQUE IOHEXOL 350 MG/ML SOLN COMPARISON:  None Available. FINDINGS: Cardiovascular: Satisfactory opacification of the pulmonary arteries to the segmental level. No evidence of pulmonary embolism. Normal heart size. No pericardial effusion. Mediastinum/Nodes: No enlarged mediastinal, hilar, or axillary lymph nodes. Thyroid gland, trachea, and esophagus demonstrate no significan seen. T findings. Lungs/Pleura: There are patchy and confluent ground-glass opacities throughout both lungs most significant in the upper lobes. Airspace consolidation is seen in the lingula and left lower lobe with air bronchograms. There are trace bilateral pleural effusions. No pneumothorax. Trachea and central airways are patent. Upper Abdomen: Cholecystectomy clips are present. Musculoskeletal: Degenerative changes affect the spine. No acute fractures are Review of the MIP images confirms the above findings. IMPRESSION: 1. No evidence for pulmonary embolism. 2. Diffuse patchy and confluent ground-glass opacities throughout both lungs. Findings are nonspecific and may be related to pulmonary edema, hemorrhage or infection. 3. Lingular and left lower lobe airspace consolidation  compatible with pneumonia. 4. Trace bilateral pleural effusions. Electronically Signed   By: Ronney Asters M.D.   On: 04/30/2022 16:25    Assessment: 51 y.o. female admitted with sepsis in setting  of acute respiratory failure with hypoxia in setting of pneumonia, acute systolic CHF (ECHO 91-66% this admission), and GI consulted due to dysphagia. 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    Dysphagia:  Tolerating soft diet without difficulty.  Not a candidate for EGD until improved from cardiopulmonary standpoint.  Still requiring 3 L supplemental O2 via nasal cannula with O2 saturation dropping down to 89% when walking to the bathroom.  Hopefully will be able to pursue EGD within the next 24-48 hours.  Chronic diarrhea:  Was on cholestyramine as outpatient.  Now with constipation.  Went 3 days without a bowel movement, but finally moved her bowels yesterday after receiving a dose of MiraLAX and Dulcolax suppository.  Cholestyramine is on hold.  Continues with MiraLAX daily.  Elevated LFTs: Noted on admission. Transaminases now normalized. Alk Phos high of 340, 262 yesterday. Fatty liver on [place]. Likely acutely elevated in setting of acute illness and hepatic congestion. Continue to monitor.   Plan: EGD once improved from cardiopulmonary standpoint. We will make n.p.o. at midnight to reassess in the a.m. for readiness. We will need to hold heparin day of EGD. Continue soft diet. Continue MiraLAX.  May decrease to as needed if she develops frequent loose stools. Repeat HFP and follow daily.   LOS: 8 days    05/02/2022, 10:01 AM   Aliene Altes, [facility]

## 2022-05-03 DIAGNOSIS — J9601 Acute respiratory failure with hypoxia: Secondary | ICD-10-CM | POA: Diagnosis not present

## 2022-05-03 DIAGNOSIS — J189 Pneumonia, unspecified organism: Principal | ICD-10-CM

## 2022-05-03 DIAGNOSIS — F32A Depression, unspecified: Secondary | ICD-10-CM | POA: Diagnosis not present

## 2022-05-03 DIAGNOSIS — K5900 Constipation, unspecified: Secondary | ICD-10-CM | POA: Diagnosis present

## 2022-05-03 DIAGNOSIS — L8990 Pressure ulcer of unspecified site, unspecified stage: Secondary | ICD-10-CM | POA: Diagnosis present

## 2022-05-03 DIAGNOSIS — I5020 Unspecified systolic (congestive) heart failure: Secondary | ICD-10-CM | POA: Diagnosis not present

## 2022-05-03 DIAGNOSIS — I5021 Acute systolic (congestive) heart failure: Secondary | ICD-10-CM | POA: Diagnosis not present

## 2022-05-03 LAB — BASIC METABOLIC PANEL
Anion gap: 8 (ref 5–15)
BUN: 13 mg/dL (ref 6–20)
CO2: 34 mmol/L — ABNORMAL HIGH (ref 22–32)
Calcium: 8 mg/dL — ABNORMAL LOW (ref 8.9–10.3)
Chloride: 94 mmol/L — ABNORMAL LOW (ref 98–111)
Creatinine, Ser: 0.34 mg/dL — ABNORMAL LOW (ref 0.44–1.00)
GFR, Estimated: >60 mL/min (ref >60)
Glucose, Bld: 154 mg/dL — ABNORMAL HIGH (ref 70–99)
Potassium: 3.9 mmol/L (ref 3.5–5.1)
Sodium: 136 mmol/L (ref 135–145)

## 2022-05-03 LAB — GLUCOSE, CAPILLARY
Glucose-Capillary: 179 mg/dL — ABNORMAL HIGH (ref 70–99)
Glucose-Capillary: 181 mg/dL — ABNORMAL HIGH (ref 70–99)

## 2022-05-03 LAB — MAGNESIUM: Magnesium: 1.9 mg/dL (ref 1.7–2.4)

## 2022-05-03 MED ORDER — HEPARIN SODIUM (PORCINE) 5000 UNIT/ML IJ SOLN
5000.0000 [IU] | Freq: Three times a day (TID) | INTRAMUSCULAR | Status: AC
  Administered 2022-05-03: 5000 [IU] via SUBCUTANEOUS
  Filled 2022-05-03: qty 1

## 2022-05-03 MED ORDER — LEVALBUTEROL HCL 0.63 MG/3ML IN NEBU
0.6300 mg | INHALATION_SOLUTION | Freq: Two times a day (BID) | RESPIRATORY_TRACT | Status: DC
Start: 2022-05-03 — End: 2022-05-05
  Administered 2022-05-03 – 2022-05-04 (×2): 0.63 mg via RESPIRATORY_TRACT
  Filled 2022-05-03 (×4): qty 3

## 2022-05-03 MED ORDER — IPRATROPIUM-ALBUTEROL 0.5-2.5 (3) MG/3ML IN SOLN: 3.0000 mL | RESPIRATORY_TRACT | Status: DC | PRN

## 2022-05-03 MED ORDER — IPRATROPIUM BROMIDE 0.02 % IN SOLN
0.5000 mg | Freq: Two times a day (BID) | RESPIRATORY_TRACT | Status: DC
  Administered 2022-05-03 – 2022-05-04 (×2): 0.5 mg via RESPIRATORY_TRACT
  Filled 2022-05-03 (×3): qty 2.5

## 2022-05-03 NOTE — Progress Notes (Signed)
Inpatient Diabetes Program Recommendations  AACE/ADA: New Consensus Statement on Inpatient Glycemic Control   Target Ranges:  Prepandial:   less than 140 mg/dL      Peak postprandial:   less than 180 mg/dL (1-2 hours)      Critically ill patients:  140 - 180 mg/dL    Latest Reference Range & Units 05/02/22 07:55 05/02/22 11:08 05/02/22 17:15  Glucose-Capillary 70 - 99 mg/dL 159 (H) 331 (H) 253 (H)    Latest Reference Range & Units 04/24/22 07:35  Hemoglobin A1C 4.8 - 5.6 % 11.5 (H)   Review of Glycemic Control  Diabetes history: DM2 Outpatient Diabetes medications: Metformin 500 mg BID Current orders for Inpatient glycemic control: Levemir 15 units daily, Novolog 0-15 units TID with meals, Novolog 0-5 units QHS, Novolog 5 units TID with meals  Inpatient Diabetes Program Recommendations:    Insulin: Per MAR, glucose was 179 mg/dl this morning and patient is currently NPO.  Please consider increasing meal coverage to Novolog 8 units TID with meals.  HbgA1C:  A1C 11.5% on 04/24/22. Diabetes coordinator spoke with patient on 04/28/22 and patient had reported she has been on [place] in the past. Benefit check was done and Rezvoglar is preferred basal insulin with a $25 co-pay.  This is a new glargine which was recently FDA approved in March 2023.  If patient is discharged on basal insulin please prescribe Rezvoglar (order [phone]).  Thanks, Barnie Alderman, RN, MSN, [place] Diabetes Coordinator Inpatient Diabetes Program [phone] (Team Pager from 8am to [place])

## 2022-05-03 NOTE — Progress Notes (Addendum)
Progress Note  Patient Name: Kelli [name] Date of Encounter: 05/03/2022  [facility] Cardiologist: Werner Lean, MD    Subjective   Breathing better. For endoscopy today.  Inpatient Medications    Scheduled Meds:  bisoprolol  2.5 mg Oral Daily   budesonide (PULMICORT) nebulizer solution  0.5 mg Nebulization BID   buPROPion  150 mg Oral Daily   chlorhexidine  15 mL Mouth Rinse BID   Chlorhexidine Gluconate Cloth  6 each Topical Daily   dextromethorphan-guaiFENesin  1 tablet Oral BID   escitalopram  10 mg Oral Daily   furosemide  40 mg Oral Daily   heparin injection (subcutaneous)  5,000 Units Subcutaneous Q8H   insulin aspart  0-15 Units Subcutaneous TID WC   insulin aspart  0-5 Units Subcutaneous QHS   insulin aspart  5 Units Subcutaneous TID WC   insulin detemir  15 Units Subcutaneous Daily   ipratropium  0.5 mg Nebulization BID   levalbuterol  0.63 mg Nebulization BID   mouth rinse  15 mL Mouth Rinse q12n4p   pantoprazole  40 mg Oral Daily   polyethylene glycol  17 g Oral Daily   potassium chloride  40 mEq Oral Daily   rosuvastatin  5 mg Oral Daily   saccharomyces boulardii  250 mg Oral BID   spironolactone  12.5 mg Oral Daily    PRN Meds: acetaminophen **OR** acetaminophen, ondansetron **OR** ondansetron (ZOFRAN) IV, sodium chloride, SUMAtriptan   Vital Signs    Vitals:   05/02/22 2033 05/02/22 2037 05/03/22 0532 05/03/22 0707  BP: (!) 108/59  (!) 103/55   Pulse: 71 71 66   Resp: '16 18 20   '$ Temp: 97.7 F (36.5 C)  97.8 F (36.6 C)   TempSrc: Oral     SpO2: 97% 97% 96% 98%  Weight:      Height:        Intake/Output Summary (Last 24 hours) at 05/03/2022 0833 Last data filed at 05/02/2022 1018 Gross per 24 hour  Intake --  Output 1400 ml  Net -1400 ml      05/02/2022    5:47 AM 05/01/2022    6:07 AM 05/01/2022    5:00 AM  Last 3 Weights  Weight (lbs) 207 lb 3.7 oz 204 lb 10.1 oz 210 lb 15.7 oz  Weight (kg) 94 kg 92.82 kg 95.7 kg       Telemetry    NSR - Personally Reviewed  Physical Exam    GEN: No acute distress.   Neck: No JVD Cardiac: RRR, no murmurs, rubs, or gallops.  Respiratory: decreased breath sounds with diffuse crackles throughout especially anteriorly GI: Soft, nontender, non-distended  MS: No edema; No deformity.  Labs    High Sensitivity Troponin:   Recent Labs  Lab 04/24/22 0735 04/24/22 0921  TROPONINIHS 5 4     Chemistry Recent Labs  Lab 04/29/22 1616 05/01/22 0426 05/02/22 0437 05/03/22 0445  NA  --  136 137 136  K  --  3.3* 3.5 3.9  CL  --  91* 92* 94*  CO2  --  31 35* 34*  GLUCOSE  --  126* 154* 154*  BUN  --  '17 16 13  '$ CREATININE  --  0.40* 0.41* 0.34*  CALCIUM  --  7.7* 7.9* 8.0*  MG  --  1.9 1.9 1.9  PROT 5.0* 5.2* 5.1*  --   ALBUMIN 2.0* 1.9* 1.8*  --   AST 42* 26 20  --   ALT  72* 43 31  --   ALKPHOS 340* 262* 225*  --   BILITOT 0.6 0.9 0.6  --   GFRNONAA  --  >60 >60 >60  ANIONGAP  --  '14 10 8     '$ Hematology Recent Labs  Lab 04/27/22 0458 04/29/22 1616 04/30/22 0418  WBC 3.4* 9.5 10.0  RBC 3.70* 3.81* 3.69*  HGB 12.5 12.8 12.5  HCT 38.3 37.9 36.9  MCV 103.5* 99.5 100.0  MCH 33.8 33.6 33.9  MCHC 32.6 33.8 33.9  RDW 13.5 13.4 13.4  PLT 107* 117* 127*    BNP Recent Labs  Lab 04/27/22 0458 04/29/22 1616  BNP 1,360.0* 113.0*    DDimer  Recent Labs  Lab 04/29/22 1720  DDIMER 2.94*     Radiology    No results found.  Cardiac Studies   ECHOCARDIOGRAM COMPLETE   Result Date: 04/27/2022    ECHOCARDIOGRAM REPORT   Patient Name:   Kelli [name] Date of Exam: 04/27/2022 Medical Rec #:  [id number]        Height:       64.0 in Accession #:    [id number]       Weight:       232.1 lb Date of Birth:  03/03/1971         BSA:          2.084 m Patient Age:    77 years         BP:           133/65 mmHg Patient Gender: F                HR:           86 bpm. Exam Location:  [facility] Procedure: 2D Echo, Cardiac Doppler and Color Doppler Indications:     CHF  History:        Patient has no prior history of Echocardiogram examinations.                 CHF; Risk Factors:Hypertension, Diabetes and Dyslipidemia.  Sonographer:    Wenda Low Referring Phys: [phone] DAVID TAT IMPRESSIONS  1. Left ventricular ejection fraction, by estimation, is 40 to 45%. The left ventricle has mildly decreased function. The left ventricle demonstrates global hypokinesis. The left ventricular internal cavity size was mildly dilated.  2. Right ventricular systolic function is low normal. The right ventricular size is normal. There is mildly elevated pulmonary artery systolic pressure.  3. The mitral valve is normal in structure. Mild mitral valve regurgitation.  4. The aortic valve is tricuspid. Aortic valve regurgitation is not visualized. Aortic valve sclerosis is present, with no evidence of aortic valve stenosis.  5. The inferior vena cava is dilated in size with <50% respiratory variability, suggesting right atrial pressure of 15 mmHg. FINDINGS  Left Ventricle: Left ventricular ejection fraction, by estimation, is 40 to 45%. The left ventricle has mildly decreased function. The left ventricle demonstrates global hypokinesis. The left ventricular internal cavity size was mildly dilated. There is  no left ventricular hypertrophy. Right Ventricle: The right ventricular size is normal. Right vetricular wall thickness was not assessed. Right ventricular systolic function is low normal. There is mildly elevated pulmonary artery systolic pressure. The tricuspid regurgitant velocity is  2.42 m/s, and with an assumed right atrial pressure of 15 mmHg, the estimated right ventricular systolic pressure is 94.7 mmHg. Left Atrium: Left atrial size was normal in size. Right Atrium: Right atrial size was normal in size. Pericardium:  There is no evidence of pericardial effusion. Mitral Valve: The mitral valve is normal in structure. Mild mitral valve regurgitation. MV peak gradient, 3.8 mmHg. The  mean mitral valve gradient is 2.0 mmHg. Tricuspid Valve: The tricuspid valve is normal in structure. Tricuspid valve regurgitation is mild. Aortic Valve: The aortic valve is tricuspid. Aortic valve regurgitation is not visualized. Aortic valve sclerosis is present, with no evidence of aortic valve stenosis. Aortic valve mean gradient measures 4.0 mmHg. Aortic valve peak gradient measures 6.9  mmHg. Aortic valve area, by VTI measures 2.16 cm. Pulmonic Valve: The pulmonic valve was grossly normal. Pulmonic valve regurgitation is not visualized. Aorta: The aortic root is normal in size and structure. Venous: The inferior vena cava is dilated in size with less than 50% respiratory variability, suggesting right atrial pressure of 15 mmHg. IAS/Shunts: No atrial level shunt detected by color flow Doppler.  LEFT VENTRICLE PLAX 2D LVIDd:         5.20 cm     Diastology LVIDs:         3.65 cm     LV e' medial:    7.94 cm/s LV PW:         0.80 cm     LV E/e' medial:  8.7 LV IVS:        1.00 cm     LV e' lateral:   14.90 cm/s LVOT diam:     2.00 cm     LV E/e' lateral: 4.6 LV SV:         57 LV SV Index:   27 LVOT Area:     3.14 cm  LV Volumes (MOD) LV vol d, MOD A2C: 94.9 ml LV vol d, MOD A4C: 81.3 ml LV vol s, MOD A2C: 48.9 ml LV vol s, MOD A4C: 42.9 ml LV SV MOD A2C:     46.0 ml LV SV MOD A4C:     81.3 ml LV SV MOD BP:      42.0 ml RIGHT VENTRICLE RV Basal diam:  3.55 cm RV Mid diam:    3.70 cm RV S prime:     12.90 cm/s TAPSE (M-mode): 2.4 cm LEFT ATRIUM             Index        RIGHT ATRIUM           Index LA diam:        4.30 cm 2.06 cm/m   RA Area:     15.70 cm LA Vol (A2C):   53.4 ml 25.62 ml/m  RA Volume:   44.80 ml  21.49 ml/m LA Vol (A4C):   55.1 ml 26.44 ml/m LA Biplane Vol: 58.7 ml 28.16 ml/m  AORTIC VALVE                    PULMONIC VALVE AV Area (Vmax):    2.27 cm     PV Vmax:       0.66 m/s AV Area (Vmean):   1.99 cm     PV Peak grad:  1.7 mmHg AV Area (VTI):     2.16 cm AV Vmax:           131.00 cm/s  AV Vmean:          90.500 cm/s AV VTI:            0.262 m AV Peak Grad:      6.9 mmHg AV Mean Grad:      4.0 mmHg  LVOT Vmax:         94.60 cm/s LVOT Vmean:        57.300 cm/s LVOT VTI:          0.180 m LVOT/AV VTI ratio: 0.69  AORTA Ao Root diam: 2.70 cm MITRAL VALVE               TRICUSPID VALVE MV Area (PHT): 4.17 cm    TR Peak grad:   23.4 mmHg MV Area VTI:   2.46 cm    TR Vmax:        242.00 cm/s MV Peak grad:  3.8 mmHg MV Mean grad:  2.0 mmHg    SHUNTS MV Vmax:       0.98 m/s    Systemic VTI:  0.18 m MV Vmean:      61.6 cm/s   Systemic Diam: 2.00 cm MV Decel Time: 182 msec MV E velocity: 69.00 cm/s MV A velocity: 77.60 cm/s MV E/A ratio:  0.89 Dorris Carnes MD Electronically signed by Dorris Carnes MD Signature Date/Time: 04/27/2022/10:03:09 PM    Final       Patient Profile     51 y.o. female  with HLD with DM, Anemia and diverticulosis, presenting with PNA.  Found to have low EF as part of her evaluation who is being seen 04/28/2022 for the evaluation of HF.  Assessment & Plan     New cardiomyopathy LVEF 40-45% on echo in the setting of sepsis, pneumonia. Multiple CV risk factors, no chest pain.  Plan to repeat echocardiogram as outpatient once recovers, if persistent systolic dysfunction will need ischemic evaluation. Marland Kitchen low dose bisoprolol 2.5 mg started yest.  f/u appt. made 05/30/22 with Bernerd Pho 3:30.   Acute Systolic CHF I/O's negative 12.6 L since admission, now on  lasix 40 mg po daily and negative 1.4 L yest.   Acute respiratory failure with hypoxia in setting of multilobar pneumonia left lung- was requiring 12 L O2 but down 3L  CT yest no PE, diffuse patchy and confluent ground glass opacities in both lungs.   Uncontrolled DM2 A1C 11.5   HLD on Crestor   Tobacco abuse-says she will quit         For questions or updates, please contact [place] Please consult [email or web address] for contact info under        Signed, Ermalinda Barrios, PA-C  05/03/2022, 8:33 AM       Attending note:  Patient seen and examined.  Reviewed interval chart and discussed case with Ms. Bonnell Public PA-C.  She is for endoscopy today for further evaluation of dysphagia and esophageal stricture.  Continues on oxygen and has completed antibiotic therapy for treatment of lobar pneumonia.  From a cardiac perspective, continue conservative management of newly documented cardiomyopathy with advancing GDMT as tolerated (presently limited by low blood pressure).  Bisoprolol was initiated yesterday, she is on Aldactone and Lasix with potassium supplement.  Hold off on adding ARNI or ARB at this point.  When she is discharged and stable from both GI and pulmonary perspectives, we will set up follow-up for her and ultimately plan to repeat an echocardiogram.  Satira Sark, M.D., F.A.C.C.

## 2022-05-03 NOTE — Assessment & Plan Note (Addendum)
--   continue skin care protocol per RN  Pressure Injury 05/02/22 Sacrum Mid Stage 2 -  Partial thickness loss of dermis presenting as a shallow open injury with a red, pink wound bed without slough. (Active)  05/02/22 1300  Location: Sacrum  Location Orientation: Mid  Staging: Stage 2 -  Partial thickness loss of dermis presenting as a shallow open injury with a red, pink wound bed without slough.  Wound Description (Comments):   Present on Admission: No

## 2022-05-03 NOTE — Progress Notes (Signed)
  Subjective: Patient down to 1.5 L O2, reports that breathing is feeling better. She states that she has not had a BM today, last BM was 2 days ago, did not get miralax this morning due to NPO status. Pt is requesting Miralax and glucerna to drink it with. Denies nausea, vomiting, abdominal pain. States she continues to have some dysphagia with water and has had dysphagia with foods previously, though had been trying to chew more thoroughly which had helped.   Objective: Vital signs in last 24 hours: Temp:  [97.5 F (36.4 C)-97.9 F (36.6 C)] 97.8 F (36.6 C) (05/24 0532) Pulse Rate:  [66-80] 66 (05/24 0532) Resp:  [16-29] 20 (05/24 0532) BP: (98-114)/(31-75) 103/55 (05/24 0532) SpO2:  [88 %-98 %] 98 % (05/24 0707) Last BM Date : 05/01/22 General:   Alert and oriented, pleasant Head:  Normocephalic and atraumatic. Eyes:  No icterus, sclera clear. Conjuctiva pink.  Mouth:  Without lesions, mucosa pink and moist.  Heart:  S1, S2 present, no murmurs noted.  Lungs: diffuse crackles, some diminished breath sounds Abdomen:  Bowel sounds present, soft, non-tender, non-distended. No HSM or hernias noted. No rebound or guarding. No masses appreciated  Msk:  Symmetrical without gross deformities. Normal posture. Pulses:  Normal pulses noted. Extremities:  Without clubbing or edema. Neurologic:  Alert and  oriented x4;  grossly normal neurologically. Skin:  Warm and dry, intact without significant lesions.  Psych:  Alert and cooperative. Normal mood and affect.  Lab Results: No results for input(s): WBC, HGB, HCT, PLT in the last 72 hours. BMET Recent Labs    05/01/22 0426 05/02/22 0437 05/03/22 0445  NA 136 137 136  K 3.3* 3.5 3.9  CL 91* 92* 94*  CO2 31 35* 34*  GLUCOSE 126* 154* 154*  BUN _0 CREATININE 0.40* 0.41* 0.34*  CALCIUM 7.7* 7.9* 8.0*   LFT Recent Labs    05/01/22 0426 05/02/22 0437  PROT 5.2* 5.1*  ALBUMIN 1.9* 1.8*  AST 26 20  ALT 43 31  ALKPHOS 262*  225*  BILITOT 0.9 0.6  BILIDIR 0.4* 0.2  IBILI 0.5 0.4    Assessment: 51 y.o. female admitted with sepsis in setting of acute respiratory failure with hypoxia in setting of pneumonia, acute systolic CHF (ECHO 40-37% this admission), and GI consulted due to dysphagia. 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   Dysphagia: Soft diet well tolerated, will need EGD once cardiopulmonary status improves, on 1.5L O2 via n.c. currently will plan for EGD possibly tomorrow as long as respiratory status continues to improve. SQ Heparin will need to be held day of procedure  Chronic Diarrhea: On cholestyramine outpatient, now with constipation, no BM x3 days but had BM on Monday after Miralax and dulcolax suppository. Cholestyramine on hold. Did not receive morning dose of miralax due to NPO status, will request Nursing staff give miralax now.  Elevated LFTs: Elevated on admission. Transaminases now WNL. ALk Phos elevated at 340 four days ago, trending down, 225 today. Fatty liver on [place], likely acutely elevated secondary to acute illness and hepatic congestion.   Plan: EGD, possibly tomorrow if respiratory status continues to improve SQ heparin will need to be held day of procedure** Soft diet today, NPO midnight  Continue miralax daily Follow LFTs daily   LOS: 9 days    05/03/2022, 10:33 AM  Kelli [name] L. Alver Sorrow, MSN, APRN, AGNP-C Adult-Gerontology Nurse Practitioner [facility]

## 2022-05-03 NOTE — Progress Notes (Signed)
SATURATION QUALIFICATIONS: (This note is used to comply with regulatory documentation for home oxygen)  Patient Saturations on Room Air at Rest = 95%  Patient Saturations on Room Air while Ambulating = 89%  Patient Saturations on 2 Liters of oxygen while Ambulating = 94%  Please briefly explain why patient needs home oxygen:

## 2022-05-03 NOTE — Progress Notes (Signed)
PROGRESS NOTE   Kelli [name]  RWE:[id number] DOB: 04/15/1971 DOA: 04/24/2022 PCP: [name], MD   Chief Complaint  Patient presents with   Shortness of Breath   Level of care: Telemetry  Brief Admission History:  Kelli [name] is a 51 y.o. female with medical history significant of prior history of asthma (per patient not a big issue lately and has never been using her home inhaler), type 2 diabetes mellitus, depression, gastroesophageal reflux disease, hyperlipidemia class II obesity; who presented to the emergency department secondary to productive coughing spells, shortness of breath, general malaise and dysuria.  Patient reports symptom has been present for the last 3-4 days and worsening.  She has noticed that in the last 24 hours she having very winded with minimal activity and just feeling completely drained.  Reports productive coughing spells (yellowish/greenish mucus, no hemoptysis), expressed being exposed to her grandkids (with URI symptoms) reported experiencing chills. Patient denies chest pain, no nausea or vomiting, pain, no hematuria, no hematochezia, no melena, no focal weakness, headache or any other complaints.   In the ED chest x-ray demonstrated multilobar pneumonia affecting her left lung, she was leukopenic, tachypneic, tachycardic and hypoxic.  Lactic acid 4.2.  Cultures were taken, fluid resuscitation per sepsis protocol initiated and IV antibiotics started.  TRH contacted to place patient in the hospital for further evaluation and management.   Of note, COVID/influenza PCR negative.   05/03/2022: Pt reports that she is only needing oxygen with ambulation to bathroom.    Assessment and Plan: * Acute respiratory failure with hypoxia (HCC) -In the setting of multilobar pneumonia (left Lung) -Patient oxygen saturation in the mid 80s on room air; -need of 8 L high flow nasal cannula to maintain saturation above 92%. -now up to 12 L>>9L>>3L>>2L -completed  5 days ceftriaxone/zithromax -Patient reporting difficulty swallowing for over 2 weeks and with high concern for aspiration.   Speech therapy has seen patient and at this moment recommendation for GI involvement -repeat BNP--113 -repeat PCT--1.31 -IS and flutter valve 4/20--repeat CXR--personally reviewed>>increase RLL opacity 5/21 CTA chest--No PE--Diffuse patchy and confluent ground-glass opacities throughout both lungs GI planning EGD in next 24 hours  Pressure injury of skin -- continue skin care protocol per RN  Pressure Injury 05/02/22 Sacrum Mid Stage 2 -  Partial thickness loss of dermis presenting as a shallow open injury with a red, pink wound bed without slough. (Active)  05/02/22 1300  Location: Sacrum  Location Orientation: Mid  Staging: Stage 2 -  Partial thickness loss of dermis presenting as a shallow open injury with a red, pink wound bed without slough.  Wound Description (Comments):   Present on Admission: No    Hypomagnesemia repleted  Acute systolic CHF (congestive heart failure)/ new cardiomyopathy 5/18--personally reviewed CXR--increase vascular congestion and R-opacities 5/18 Echo--EF 40-45%, low normal RV; mild MR/TR Continue lasix 40 IV bid>>po lasix on 5/24 Accurate I/Os--incomplete Hold SGLT due to sepsis Spironolactone added 05/02/22  Hypokalemia Repleted mag--repleted  Lobar pneumonia ([place]) 5/18 CXR--personally reviewed--increase vascular congestion, increase R-opacities Check PCT--4.24>>2.07>>1.31 Finished 5 days ceftriaxone/azithro -finished 5 days ceftriaxone and azithro and 2 days zosyn (7 days total abx) BNP--1360>>113 5/21 CTA chest--No PE--Diffuse patchy and confluent ground-glass opacities throughout both lungs  Dysphagia - Per patient ongoing for over 2 weeks -Affecting liquids and solids in different locations. 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 -EGD planned for  05/03/22 -Dysphagia 1 diet with thin liquids and close supervision has been recommended  by speech therapy   Severe sepsis ([place]) -Meeting severe sepsis criteria at time of admission with leukopenia, fever, tachycardia, presence of lactic acidosis (lactic acid 4.2), increased respiratory rate and the presence of hypoxia as part of organ dysfunction. -Lactic acid peaked 4.2 -Patient ended requiring up to 8 L high flow>>12L>>3L -finished 5 days ceftriaxone and azithro and 2 days zosyn (7 days total abx) -sepsis physiology resolved   Chronic diarrhea -In the setting of IBS -Continue the use of Questran and Florastor. -Appears to be stable/improved overall to resolved now.   Depression -No suicidal ideation or hallucinations -Continue Lexapro and Wellbutrin.  Hyperlipidemia -Continue statin  Uncontrolled type 2 diabetes mellitus with hyperglycemia, without long-term current use of insulin ([place]) -Patient with hyperglycemia -04/24/22 A1c 11.5 -Continue sliding scale insulin and Levemir; follow CBGs and adjust hypoglycemia regimen as needed. -Holding oral hypoglycemic agents while inpatient. CBGs largely controlled currently continue novolog 5 units TIW CBG (last 3)  Recent Labs    05/02/22 0755 05/02/22 1108 05/02/22 1715  GLUCAP 159* 331* 253*     DVT prophylaxis: SQ heparin  Code Status: Full  Family Communication:  Disposition: Status is: Inpatient Remains inpatient appropriate because:    Consultants:  [facility] Procedures:   Antimicrobials:    Subjective: Pt reports SOB is much improved.   Objective: Vitals:   05/02/22 2033 05/02/22 2037 05/03/22 0532 05/03/22 0707  BP: (!) 108/59  (!) 103/55   Pulse: 71 71 66   Resp: '16 18 20   '$ Temp: 97.7 F (36.5 C)  97.8 F (36.6 C)   TempSrc: Oral     SpO2: 97% 97% 96% 98%  Weight:      Height:        Intake/Output Summary (Last 24 hours) at 05/03/2022 1104 Last data filed at 05/03/2022 0900 Gross per  24 hour  Intake 0 ml  Output --  Net 0 ml   Filed Weights   05/01/22 0500 05/01/22 0607 05/02/22 0547  Weight: 95.7 kg 92.8 kg 94 kg   Examination:  General exam: Appears calm and comfortable  Respiratory system: no crackles or rales heard. Cardiovascular system: normal S1 & S2 heard. No JVD, murmurs, rubs, gallops or clicks. No pedal edema. Gastrointestinal system: Abdomen is nondistended, soft and nontender. No organomegaly or masses felt. Normal bowel sounds heard. Central nervous system: Alert and oriented. No focal neurological deficits. Extremities: Symmetric 5 x 5 power. Skin: No rashes, lesions or ulcers. Psychiatry: Judgement and insight appear normal. Mood & affect appropriate.   Data Reviewed: I have personally reviewed following labs and imaging studies  CBC: Recent Labs  Lab 04/27/22 0458 04/29/22 1616 04/30/22 0418  WBC 3.4* 9.5 10.0  HGB 12.5 12.8 12.5  HCT 38.3 37.9 36.9  MCV 103.5* 99.5 100.0  PLT 107* 117* 127*    Basic Metabolic Panel: Recent Labs  Lab 04/28/22 0342 04/29/22 0109 05/01/22 0426 05/02/22 0437 05/03/22 0445  NA 141 141 136 137 136  K 3.4* 2.9* 3.3* 3.5 3.9  CL 100 95* 91* 92* 94*  CO2 29 36* 31 35* 34*  GLUCOSE 164* 184* 126* 154* 154*  BUN '16 15 17 16 13  '$ CREATININE 0.40* 0.38* 0.40* 0.41* 0.34*  CALCIUM 8.1* 7.9* 7.7* 7.9* 8.0*  MG 1.6* 1.9 1.9 1.9 1.9    CBG: Recent Labs  Lab 05/01/22 1642 05/01/22 2129 05/02/22 0755 05/02/22 1108 05/02/22 1715  GLUCAP 121* 223* 159* 331* 253*    Recent Results (from the past 240  hour(s))  Resp Panel by RT-PCR (Flu A&B, Covid) Nasopharyngeal Swab     Status: None   Collection Time: 04/24/22  7:25 AM   Specimen: Nasopharyngeal Swab; Nasopharyngeal(NP) swabs in vial transport medium  Result Value Ref Range Status   SARS Coronavirus 2 by RT PCR NEGATIVE NEGATIVE Final    Comment: (NOTE) SARS-CoV-2 target nucleic acids are NOT DETECTED.  The SARS-CoV-2 RNA is generally  detectable in upper respiratory specimens during the acute phase of infection. The lowest concentration of SARS-CoV-2 viral copies this assay can detect is 138 copies/mL. A negative result does not preclude SARS-Cov-2 infection and should not be used as the sole basis for treatment or other patient management decisions. A negative result may occur with  improper specimen collection/handling, submission of specimen other than nasopharyngeal swab, presence of viral mutation(s) within the areas targeted by this assay, and inadequate number of viral copies(<138 copies/mL). A negative result must be combined with clinical observations, patient history, and epidemiological information. The expected result is Negative.  Fact Sheet for Patients:  EntrepreneurPulse.com.au  Fact Sheet for Healthcare Providers:  IncredibleEmployment.be  This test is no t yet approved or cleared by the [place] FDA and  has been authorized for detection and/or diagnosis of SARS-CoV-2 by FDA under an Emergency Use Authorization (EUA). This EUA will remain  in effect (meaning this test can be used) for the duration of the COVID-19 declaration under Section 564(b)(1) of the Act, 21 U.S.C.section 360bbb-3(b)(1), unless the authorization is terminated  or revoked sooner.       Influenza A by PCR NEGATIVE NEGATIVE Final   Influenza B by PCR NEGATIVE NEGATIVE Final    Comment: (NOTE) The Xpert Xpress SARS-CoV-2/FLU/RSV plus assay is intended as an aid in the diagnosis of influenza from Nasopharyngeal swab specimens and should not be used as a sole basis for treatment. Nasal washings and aspirates are unacceptable for Xpert Xpress SARS-CoV-2/FLU/RSV testing.  Fact Sheet for Patients: EntrepreneurPulse.com.au  Fact Sheet for Healthcare Providers: IncredibleEmployment.be  This test is not yet approved or cleared by the [place] FDA  and has been authorized for detection and/or diagnosis of SARS-CoV-2 by FDA under an Emergency Use Authorization (EUA). This EUA will remain in effect (meaning this test can be used) for the duration of the COVID-19 declaration under Section 564(b)(1) of the Act, 21 U.S.C. section 360bbb-3(b)(1), unless the authorization is terminated or revoked.  Performed at [facility], [address], [place], [place] [address]   Urine Culture     Status: Abnormal   Collection Time: 04/24/22  7:33 AM   Specimen: Urine, Catheterized  Result Value Ref Range Status   Specimen Description   Final    URINE, CATHETERIZED Performed at [facility], [address] [place], [place] [address]    Special Requests   Final    NONE Performed at [facility], [address], [place], [place] [address]    Culture >=100,000 COLONIES/mL ESCHERICHIA COLI (A)  Final   Report Status 04/26/2022 FINAL  Final   Organism ID, Bacteria ESCHERICHIA COLI (A)  Final      Susceptibility   Escherichia coli - MIC*    AMPICILLIN >=32 RESISTANT Resistant     CEFAZOLIN <=4 SENSITIVE Sensitive     CEFEPIME <=0.12 SENSITIVE Sensitive     CEFTRIAXONE <=0.25 SENSITIVE Sensitive     CIPROFLOXACIN >=4 RESISTANT Resistant     GENTAMICIN <=1 SENSITIVE Sensitive     IMIPENEM <=0.25 SENSITIVE Sensitive  NITROFURANTOIN <=16 SENSITIVE Sensitive     TRIMETH/SULFA <=20 SENSITIVE Sensitive     AMPICILLIN/SULBACTAM 16 INTERMEDIATE Intermediate     PIP/TAZO <=4 SENSITIVE Sensitive     * >=100,000 COLONIES/mL ESCHERICHIA COLI  Blood Culture (routine x 2)     Status: None   Collection Time: 04/24/22  7:57 AM   Specimen: BLOOD LEFT HAND  Result Value Ref Range Status   Specimen Description BLOOD LEFT HAND  Final   Special Requests   Final    BOTTLES DRAWN AEROBIC AND ANAEROBIC Blood Culture results may not be optimal due to an inadequate volume of blood received in culture bottles   Culture   Final    NO GROWTH 5 DAYS Performed at  [facility], [address], [place], [place] [address]    Report Status 04/29/2022 FINAL  Final  Blood Culture (routine x 2)     Status: None   Collection Time: 04/24/22  7:57 AM   Specimen: BLOOD RIGHT WRIST  Result Value Ref Range Status   Specimen Description BLOOD RIGHT WRIST  Final   Special Requests   Final    BOTTLES DRAWN AEROBIC AND ANAEROBIC Blood Culture adequate volume   Culture   Final    NO GROWTH 5 DAYS Performed at [facility], [address], [place], [place] [address]    Report Status 04/29/2022 FINAL  Final  MRSA Next Gen by PCR, Nasal     Status: None   Collection Time: 04/24/22 10:54 AM   Specimen: Urine, Clean Catch; Nasal Swab  Result Value Ref Range Status   MRSA by PCR Next Gen NOT DETECTED NOT DETECTED Final    Comment: (NOTE) The GeneXpert MRSA Assay (FDA approved for NASAL specimens only), is one component of a comprehensive MRSA colonization surveillance program. It is not intended to diagnose MRSA infection nor to guide or monitor treatment for MRSA infections. Test performance is not FDA approved in patients less than 64 years old. Performed at [facility], [address], [place], [place] [address]      Radiology Studies: No results found.  Scheduled Meds:  bisoprolol  2.5 mg Oral Daily   budesonide (PULMICORT) nebulizer solution  0.5 mg Nebulization BID   buPROPion  150 mg Oral Daily   chlorhexidine  15 mL Mouth Rinse BID   Chlorhexidine Gluconate Cloth  6 each Topical Daily   dextromethorphan-guaiFENesin  1 tablet Oral BID   escitalopram  10 mg Oral Daily   furosemide  40 mg Oral Daily   heparin injection (subcutaneous)  5,000 Units Subcutaneous Q8H   insulin aspart  0-15 Units Subcutaneous TID WC   insulin aspart  0-5 Units Subcutaneous QHS   insulin aspart  5 Units Subcutaneous TID WC   insulin detemir  15 Units Subcutaneous Daily   ipratropium  0.5 mg Nebulization BID   levalbuterol  0.63 mg Nebulization BID   mouth rinse  15 mL  Mouth Rinse q12n4p   pantoprazole  40 mg Oral Daily   polyethylene glycol  17 g Oral Daily   potassium chloride  40 mEq Oral Daily   rosuvastatin  5 mg Oral Daily   saccharomyces boulardii  250 mg Oral BID   spironolactone  12.5 mg Oral Daily   Continuous Infusions:   LOS: 9 days   Time spent: 36 mins  Abdias Hickam Wynetta Emery, MD How to contact the [facility] Attending or Consulting provider 7A - 7P or covering provider during after hours [place], for this patient?  Check the care team in [facility] and look for a) attending/consulting [facility] provider listed and b) the [facility] team listed Log into [email or web address] and use [place]'s universal password to access. If you do not have the password, please contact the hospital operator. Locate the [facility] provider you are looking for under [facility] and page to a number that you can be directly reached. If you still have difficulty reaching the provider, please page the [facility] (Director on Call) for the Hospitalists listed on amion for assistance.  05/03/2022, 11:04 AM

## 2022-05-04 ENCOUNTER — Encounter (HOSPITAL_COMMUNITY)
Admission: EM | Disposition: A | Discharge status: Home / Self Care | Payer: Self-pay | Source: Home / Self Care | Attending: Internal Medicine

## 2022-05-04 ENCOUNTER — Inpatient Hospital Stay (HOSPITAL_COMMUNITY): Payer: PRIVATE HEALTH INSURANCE | Admitting: Anesthesiology

## 2022-05-04 DIAGNOSIS — I110 Hypertensive heart disease with heart failure: Secondary | ICD-10-CM

## 2022-05-04 DIAGNOSIS — I509 Heart failure, unspecified: Secondary | ICD-10-CM

## 2022-05-04 DIAGNOSIS — R933 Abnormal findings on diagnostic imaging of other parts of digestive tract: Secondary | ICD-10-CM

## 2022-05-04 DIAGNOSIS — F17210 Nicotine dependence, cigarettes, uncomplicated: Secondary | ICD-10-CM

## 2022-05-04 DIAGNOSIS — J9601 Acute respiratory failure with hypoxia: Secondary | ICD-10-CM | POA: Diagnosis not present

## 2022-05-04 DIAGNOSIS — K5900 Constipation, unspecified: Secondary | ICD-10-CM

## 2022-05-04 DIAGNOSIS — I5021 Acute systolic (congestive) heart failure: Secondary | ICD-10-CM | POA: Diagnosis not present

## 2022-05-04 DIAGNOSIS — J189 Pneumonia, unspecified organism: Principal | ICD-10-CM | POA: Diagnosis present

## 2022-05-04 DIAGNOSIS — R1310 Dysphagia, unspecified: Secondary | ICD-10-CM

## 2022-05-04 HISTORY — PX: ESOPHAGOGASTRODUODENOSCOPY (EGD) WITH PROPOFOL: SHX5813

## 2022-05-04 HISTORY — PX: BIOPSY: SHX5522

## 2022-05-04 HISTORY — PX: ESOPHAGEAL DILATION: SHX303

## 2022-05-04 LAB — GLUCOSE, CAPILLARY
Glucose-Capillary: 190 mg/dL — ABNORMAL HIGH (ref 70–99)
Glucose-Capillary: 179 mg/dL — ABNORMAL HIGH (ref 70–99)
Glucose-Capillary: 161 mg/dL — ABNORMAL HIGH (ref 70–99)
Glucose-Capillary: 167 mg/dL — ABNORMAL HIGH (ref 70–99)

## 2022-05-04 SURGERY — ESOPHAGOGASTRODUODENOSCOPY (EGD) WITH PROPOFOL
Anesthesia: General

## 2022-05-04 MED ORDER — POTASSIUM CHLORIDE CRYS ER 10 MEQ PO TBCR
10.0000 meq | EXTENDED_RELEASE_TABLET | Freq: Every day | ORAL | 1 refills | Status: DC
Start: 2022-05-05 — End: 2022-06-21

## 2022-05-04 MED ORDER — PROPOFOL 10 MG/ML IV BOLUS
INTRAVENOUS | Status: DC | PRN
  Administered 2022-05-04 (×2): 20 mg via INTRAVENOUS
  Administered 2022-05-04: 100 mg via INTRAVENOUS
  Administered 2022-05-04 (×3): 40 mg via INTRAVENOUS

## 2022-05-04 MED ORDER — INSULIN DETEMIR 100 UNIT/ML FLEXPEN: 15.0000 [IU] | PEN_INJECTOR | Freq: Every day | SUBCUTANEOUS | 0 refills | Status: DC

## 2022-05-04 MED ORDER — PROPOFOL 500 MG/50ML IV EMUL
INTRAVENOUS | Status: AC
  Filled 2022-05-04: qty 50

## 2022-05-04 MED ORDER — BISOPROLOL FUMARATE 5 MG PO TABS: 2.5000 mg | ORAL_TABLET | Freq: Every day | ORAL | 2 refills | Status: DC

## 2022-05-04 MED ORDER — SPIRONOLACTONE 25 MG PO TABS: 12.5000 mg | ORAL_TABLET | Freq: Every day | ORAL | 1 refills | Status: DC

## 2022-05-04 MED ORDER — ROSUVASTATIN CALCIUM 5 MG PO TABS: 5.0000 mg | ORAL_TABLET | Freq: Every day | ORAL | 1 refills | Status: DC

## 2022-05-04 MED ORDER — BLOOD GLUCOSE METER KIT
PACK | 0 refills | Status: DC
Start: 2022-05-04 — End: 2024-05-14

## 2022-05-04 MED ORDER — METFORMIN HCL ER 500 MG PO TB24: 1000.0000 mg | ORAL_TABLET | Freq: Two times a day (BID) | ORAL | 1 refills | Status: AC

## 2022-05-04 MED ORDER — INSULIN PEN NEEDLE 31G X 5 MM MISC: 1.0000 | 0 refills | Status: AC

## 2022-05-04 MED ORDER — LACTATED RINGERS IV SOLN: INTRAVENOUS | Status: DC

## 2022-05-04 MED ORDER — LIDOCAINE 2% (20 MG/ML) 5 ML SYRINGE
INTRAMUSCULAR | Status: DC | PRN
  Administered 2022-05-04: 50 mg via INTRAVENOUS

## 2022-05-04 MED ORDER — POLYETHYLENE GLYCOL 3350 17 G PO PACK: 17.0000 g | PACK | Freq: Every day | ORAL | 1 refills | Status: AC | PRN

## 2022-05-04 MED ORDER — FUROSEMIDE 40 MG PO TABS: 40.0000 mg | ORAL_TABLET | Freq: Every day | ORAL | 1 refills | Status: DC

## 2022-05-04 MED ORDER — SODIUM CHLORIDE 0.9 % IV SOLN: INTRAVENOUS | Status: DC

## 2022-05-04 MED ORDER — PANTOPRAZOLE SODIUM 40 MG PO TBEC: 40.0000 mg | DELAYED_RELEASE_TABLET | Freq: Every day | ORAL | 1 refills | Status: DC

## 2022-05-04 NOTE — Progress Notes (Signed)
Pt discharged via WC to POV with all personal belongings in her possession. 

## 2022-05-04 NOTE — Op Note (Signed)
[facility] Patient Name: Kelli [name] Procedure Date: 05/04/2022 2:49 PM MRN: [id number] Date of Birth: [birth year] Attending MD: Norvel Richards , MD CSN: [id number] Age: 51 Admit Type: Inpatient Procedure:                Upper GI endoscopy Indications:              Dysphagia, Abnormal UGI series Providers:                Norvel Richards, MD, Janeece Riggers, RN,                            Gwynneth Albright RN, RN, Randa Spike,                            Technician Referring MD:              Medicines:                Propofol per Anesthesia Complications:            No immediate complications. Estimated Blood Loss:     Estimated blood loss was minimal. Procedure:                Pre-Anesthesia Assessment:                           - Prior to the procedure, a History and Physical                            was performed, and patient medications and                            allergies were reviewed. The patient's tolerance of                            previous anesthesia was also reviewed. The risks                            and benefits of the procedure and the sedation                            options and risks were discussed with the patient.                            All questions were answered, and informed consent                            was obtained. Prior Anticoagulants: The patient has                            taken no previous anticoagulant or antiplatelet                            agents. ASA Grade Assessment: III - A patient with  severe systemic disease. After reviewing the risks                            and benefits, the patient was deemed in                            satisfactory condition to undergo the procedure.                           After obtaining informed consent, the endoscope was                            passed under direct vision. Throughout the                            procedure, the patient's  blood pressure, pulse, and                            oxygen saturations were monitored continuously. The                            [id number]-H190 ([id number]) scope was introduced through the                            mouth, and advanced to the second part of duodenum.                            The upper GI endoscopy was accomplished without                            difficulty. The patient tolerated the procedure                            well. Scope In: 3:18:28 PM Scope Out: 3:27:55 PM Total Procedure Duration: 0 hours 9 minutes 27 seconds  Findings:      Normal-appearing esophageal mucosa. Tubular esophagus slightly dilated       subjectively. Lumen at the level of the LES closed. Slight "elastic"       feel of the esophagus as it opened up with the gastroscope being passed.       The mucosa of the esophagus throughout its course appeared normal. Tiny       hiatal hernia noted. Somewhat diffuse snake skinning of the gastric       mucosa with streaky erythema of the gastric body folds. No ulcer or       infiltrating process observed. Pylorus patent. Examination the first and       second portion of duodenum revealed no abnormalities      Scope was withdrawn, a 54 [place] Maloney dilato was passed full       insertion easily. Subsequently, a 28 [place] Maloney dilators passed a       full insertion with mild resistance. A look back revealed a very       superficial tear involving the esophageal mucosa just above the GE       junction. Finally, biopsies of the antrum and gastric body taken  for       histologic study. Impression:               -Slight dilation of the esophageal body -status                            post Maloney dilation as described above. Abnormal                            gastric mucosa -query portal hypertensive                            gastropathy versus H. pylori infection, etc. Status                            post gastric biopsy.                            -Endoscopic findings suspicious for achalasia. May                            will need further evaluation in the coming weeks                            with manometry, etc. Moderate Sedation:      Moderate (conscious) sedation was personally administered by an       anesthesia professional. The following parameters were monitored: oxygen       saturation, heart rate, blood pressure, respiratory rate, EKG, adequacy       of pulmonary ventilation, and response to care. Recommendation:           -Dysphagia 1 diet. Daily PPI. Close interval                            follow-up with Dr. Ardis Hughs post discharge. Follow-up                            on pathology.                           - Return patient to hospital ward for ongoing care. Procedure Code(s):        --- Professional ---                           [phone], Esophagogastroduodenoscopy, flexible,                            transoral; diagnostic, including collection of                            specimen(s) by brushing or washing, when performed                            (separate procedure) Diagnosis Code(s):        --- Professional ---  R13.10, Dysphagia, unspecified                           R93.3, Abnormal findings on diagnostic imaging of                            other parts of digestive tract CPT copyright 2019 American Medical Association. All rights reserved. The codes documented in this report are preliminary and upon coder review may  be revised to meet current compliance requirements. Cristopher Estimable. Jaselle Pryer, MD Norvel Richards, MD 05/04/2022 3:41:21 PM This report has been signed electronically. Number of Addenda: 0

## 2022-05-04 NOTE — Discharge Instructions (Signed)
Dysphagia Diet: Level 1 (Pureed) is recommended by GI.  Please follow up with your GI Dr. Ardis Hughs as soon as possible in next 1-2 weeks for recheck.  A level 1 dysphagia diet is a special eating plan. Your healthcare provider may tell you to use it if you have moderate to severe dysphagia.  What is a dysphagia diet? When you have dysphagia, you have trouble swallowing. You are also at risk for aspiration. Aspiration is when food or liquid enters the lungs by accident. It can cause pneumonia and other problems. A dysphagia diet can help prevent aspiration.  The foods you eat can affect your ability to swallow. For example, soft foods are easier to swallow than hard foods. A dysphagia diet plan has 3 levels. Each level is based on how serious a person's dysphagia is. A level 1 diet is the most limited. People on this diet should eat only pureed, pudding-like foods. They should avoid foods with coarse textures.  What can you eat? If you are on a level 1 dysphagia diet, there are certain foods you can and can't eat. Make sure you follow all your healthcare provider's instructions. Even eating 1 food that is not approved can greatly raise your risk for aspiration.  Foods you can eat:  Pureed breads (also called pre-gelled breads)  Smooth puddings, custards, yogurts, and pureed desserts  Pureed fruits and mashed bananas  Pureed meats  Souffls  Mashed potatoes that are moistened  Pureed soups  Pureed vegetables with no lumps, chunks, or seeds  Foods to avoid:  Non-pureed breads  Any cereal with lumps  Cookies, cakes, or pastry  Whole fruit of any kind  Non-pureed meats, beans, or cheese  Scrambled, fried, or hard-boiled eggs  Non-pureed potatoes, pasta, or rice  Non-pureed soups  Non-pureed vegetables  Seeds, nuts, or chewy candies  During and after eating While eating, it may help to sit upright. You may need support pillows to get into the best position. It may also  help to have few distractions while drinking. Changing between solid food and liquids may also help your swallowing.  Stay upright for at least 30 minutes after eating. This can help reduce the risk for aspiration. Keep watch for symptoms of aspiration such as:  Coughing or wheezing during or right after eating  Too much saliva  Shortness of breath or tiredness while eating  A wet-sounding voice during or after eating or drinking  Fever 30 to 60 minutes after eating  Take the time to make meals that look, smell, and taste good. Add seasonings to your food. Set the table. Even though you can't have certain foods, you can still enjoy eating.  Drinking liquids Ask your healthcare provider about what kinds of liquids are safe for you on a level 1 dysphagia diet. Some people can drink thin liquids, but others should not. If you can't have thin liquids, make sure your liquids are thickened. Your healthcare provider will give you more information about how to manage the thickness of liquids.  While you're on a dysphagia diet Follow all instructions about what foods and drinks you can have.  Do swallowing exercises as advised.  Do not change your food or liquids, even if your swallowing gets better. Talk with your healthcare provider first.  Tell all health care providers and caregivers that you are on a dysphagia diet. Explain which foods and liquids you can and can't have.  How long a dysphagia diet is needed? Your healthcare team will  keep track of how well you are swallowing. You may need follow-up tests such as a fiberoptic endoscopic evaluation of swallowing (FEES) test. If your swallowing gets better, you may be able to change your diet. Many people who have dysphagia because of a stroke find that their swallowing problems get better with time and treatment. If your swallowing gets better, you may be able to change to a less restrictive diet. If your swallowing gets worse, you may need  other methods of getting nutrition for a period of time. For example, you may need a feeding tube.  Call 911 Call 911 if you have trouble breathing during or after eating.  When to call your healthcare provider Call your healthcare provider right away if you have any of these:  Trouble swallowing gets worse  Unintended weight loss  Food comes back up into your mouth  Vomiting  A wet-sounding voice after eating or drinking     IMPORTANT INFORMATION: PAY CLOSE ATTENTION   PHYSICIAN DISCHARGE INSTRUCTIONS  Follow with Primary care provider  Lowden Nation, MD  and other consultants as instructed by your Hospitalist Physician  [place], WORSEN OR NEW PROBLEM DEVELOPS   Please note: You were cared for by a hospitalist during your hospital stay. Every effort will be made to forward records to your primary care provider.  You can request that your primary care provider send for your hospital records if they have not received them.  Once you are discharged, your primary care physician will handle any further medical issues. Please note that NO REFILLS for any discharge medications will be authorized once you are discharged, as it is imperative that you return to your primary care physician (or establish a relationship with a primary care physician if you do not have one) for your post hospital discharge needs so that they can reassess your need for medications and monitor your lab values.  Please get a complete blood count and chemistry panel checked by your Primary MD at your next visit, and again as instructed by your Primary MD.  Get Medicines reviewed and adjusted: Please take all your medications with you for your next visit with your Primary MD  Laboratory/radiological data: Please request your Primary MD to go over all hospital tests and procedure/radiological results at the follow up, please ask your primary care  provider to get all Hospital records sent to his/her office.  In some cases, they will be blood work, cultures and biopsy results pending at the time of your discharge. Please request that your primary care provider follow up on these results.  If you are diabetic, please bring your blood sugar readings with you to your follow up appointment with primary care.    Please call and make your follow up appointments as soon as possible.    Also Note the following: If you experience worsening of your admission symptoms, develop shortness of breath, life threatening emergency, suicidal or homicidal thoughts you must seek medical attention immediately by calling 911 or calling your MD immediately  if symptoms less severe.  You must read complete instructions/literature along with all the possible adverse reactions/side effects for all the Medicines you take and that have been prescribed to you. Take any new Medicines after you have completely understood and accpet all the possible adverse reactions/side effects.   Do not drive when taking Pain medications or sleeping medications (Benzodiazepines)  Do not take more than prescribed  Pain, Sleep and Anxiety Medications. It is not advisable to combine anxiety,sleep and pain medications without talking with your primary care practitioner  Special Instructions: If you have smoked or chewed Tobacco  in the last 2 yrs please stop smoking, stop any regular Alcohol  and or any Recreational drug use.  Wear Seat belts while driving.  Do not drive if taking any narcotic, mind altering or controlled substances or recreational drugs or alcohol.

## 2022-05-04 NOTE — Assessment & Plan Note (Signed)
--   fully treated in hospital.

## 2022-05-04 NOTE — Progress Notes (Signed)
05/04/2022 6:14 PM   Pt insisting on going home tonight.  We had planned to monitor her until tomorrow.  She is not willing to stay another night.  Will DC home tonight.  Pt aware her pharmacy is closing and she says she will pick up Rxs in AM and did not want me to send to another pharmacy tonight.    Murvin Natal, MD  How to contact the [place] Day Attending or Consulting provider [place] or covering provider during after hours [place], for this patient?  Check the care team in [facility] and look for a) attending/consulting [facility] provider listed and b) the [facility] team listed Log into [email or web address] and use [place]'s universal password to access. If you do not have the password, please contact the hospital operator. Locate the [facility] provider you are looking for under [facility] and page to a number that you can be directly reached. If you still have difficulty reaching the provider, please page the [facility] (Director on Call) for the Hospitalists listed on amion for assistance.

## 2022-05-04 NOTE — Progress Notes (Addendum)
Progress Note  Patient Name: Kelli [name] Date of Encounter: 05/04/2022  [facility] Cardiologist: Werner Lean, MD    Subjective   She is to have an EGD this afternoon, hopes to discharge after that She reiterates that she will not smoke, and will do daily weights She was not on O2 before she came in the hospital  Inpatient Medications    Scheduled Meds:  bisoprolol  2.5 mg Oral Daily   budesonide (PULMICORT) nebulizer solution  0.5 mg Nebulization BID   buPROPion  150 mg Oral Daily   chlorhexidine  15 mL Mouth Rinse BID   Chlorhexidine Gluconate Cloth  6 each Topical Daily   dextromethorphan-guaiFENesin  1 tablet Oral BID   escitalopram  10 mg Oral Daily   furosemide  40 mg Oral Daily   insulin aspart  0-15 Units Subcutaneous TID WC   insulin aspart  0-5 Units Subcutaneous QHS   insulin aspart  5 Units Subcutaneous TID WC   insulin detemir  15 Units Subcutaneous Daily   ipratropium  0.5 mg Nebulization BID   levalbuterol  0.63 mg Nebulization BID   mouth rinse  15 mL Mouth Rinse q12n4p   pantoprazole  40 mg Oral Daily   polyethylene glycol  17 g Oral Daily   potassium chloride  40 mEq Oral Daily   rosuvastatin  5 mg Oral Daily   saccharomyces boulardii  250 mg Oral BID   spironolactone  12.5 mg Oral Daily    PRN Meds: acetaminophen **OR** acetaminophen, ipratropium-albuterol, ondansetron **OR** ondansetron (ZOFRAN) IV, sodium chloride, SUMAtriptan   Vital Signs    Vitals:   05/03/22 1925 05/03/22 2104 05/03/22 2148 05/04/22 0356  BP:  (!) 91/48 (!) 100/55 95/61  Pulse:  73 72 65  Resp:  18  18  Temp:  98.1 F (36.7 C)  97.7 F (36.5 C)  TempSrc:      SpO2: 93% 97%  95%  Weight:      Height:        Intake/Output Summary (Last 24 hours) at 05/04/2022 0759 Last data filed at 05/03/2022 1700 Gross per 24 hour  Intake 480 ml  Output --  Net 480 ml      05/02/2022    5:47 AM 05/01/2022    6:07 AM 05/01/2022    5:00 AM  Last 3 Weights   Weight (lbs) 207 lb 3.7 oz 204 lb 10.1 oz 210 lb 15.7 oz  Weight (kg) 94 kg 92.82 kg 95.7 kg      Telemetry    Sinus rhythm- Personally Reviewed  Physical Exam    GEN: No acute distress.   Neck: No JVD Cardiac: RRR, no murmur, no rubs, or gallops.  Respiratory:  rales on the left, some diffuse crackles as well GI: Soft, nontender, non-distended  MS: No edema; No deformity. Neuro:  Nonfocal  Psych: Normal affect   Labs    High Sensitivity Troponin:   Recent Labs  Lab 04/24/22 0735 04/24/22 0921  TROPONINIHS 5 4     Chemistry Recent Labs  Lab 04/29/22 1616 05/01/22 0426 05/02/22 0437 05/03/22 0445  NA  --  136 137 136  K  --  3.3* 3.5 3.9  CL  --  91* 92* 94*  CO2  --  31 35* 34*  GLUCOSE  --  126* 154* 154*  BUN  --  '17 16 13  '$ CREATININE  --  0.40* 0.41* 0.34*  CALCIUM  --  7.7* 7.9* 8.0*  MG  --  1.9 1.9 1.9  PROT 5.0* 5.2* 5.1*  --   ALBUMIN 2.0* 1.9* 1.8*  --   AST 42* 26 20  --   ALT 72* 43 31  --   ALKPHOS 340* 262* 225*  --   BILITOT 0.6 0.9 0.6  --   GFRNONAA  --  >60 >60 >60  ANIONGAP  --  '14 10 8     '$ Hematology Recent Labs  Lab 04/29/22 1616 04/30/22 0418  WBC 9.5 10.0  RBC 3.81* 3.69*  HGB 12.8 12.5  HCT 37.9 36.9  MCV 99.5 100.0  MCH 33.6 33.9  MCHC 33.8 33.9  RDW 13.4 13.4  PLT 117* 127*    BNP Recent Labs  Lab 04/29/22 1616  BNP 113.0*    DDimer  Recent Labs  Lab 04/29/22 1720  DDIMER 2.94*     Radiology    CT Angio Chest Pulmonary Embolism (PE) W or WO Contrast  Result Date: 04/30/2022 CLINICAL DATA:  High probability for PE. Pleuritic chest pain, hypoxia and dyspnea. EXAM: CT ANGIOGRAPHY CHEST WITH CONTRAST TECHNIQUE: Multidetector CT imaging of the chest was performed using the standard protocol during bolus administration of intravenous contrast. Multiplanar CT image reconstructions and MIPs were obtained to evaluate the vascular anatomy. RADIATION DOSE REDUCTION: This exam was performed according to the  departmental dose-optimization program which includes automated exposure control, adjustment of the mA and/or kV according to patient size and/or use of iterative reconstruction technique. CONTRAST:  25m OMNIPAQUE IOHEXOL 350 MG/ML SOLN COMPARISON:  None Available. FINDINGS: Cardiovascular: Satisfactory opacification of the pulmonary arteries to the segmental level. No evidence of pulmonary embolism. Normal heart size. No pericardial effusion. Mediastinum/Nodes: No enlarged mediastinal, hilar, or axillary lymph nodes. Thyroid gland, trachea, and esophagus demonstrate no significan seen. T findings. Lungs/Pleura: There are patchy and confluent ground-glass opacities throughout both lungs most significant in the upper lobes. Airspace consolidation is seen in the lingula and left lower lobe with air bronchograms. There are trace bilateral pleural effusions. No pneumothorax. Trachea and central airways are patent. Upper Abdomen: Cholecystectomy clips are present. Musculoskeletal: Degenerative changes affect the spine. No acute fractures are Review of the MIP images confirms the above findings. IMPRESSION: 1. No evidence for pulmonary embolism. 2. Diffuse patchy and confluent ground-glass opacities throughout both lungs. Findings are nonspecific and may be related to pulmonary edema, hemorrhage or infection. 3. Lingular and left lower lobe airspace consolidation compatible with pneumonia. 4. Trace bilateral pleural effusions. Electronically Signed   By: ARonney AstersM.D.   On: 04/30/2022 16:25   UKoreaAbdomen Complete  Result Date: 04/30/2022 CLINICAL DATA:  Abnormal LFTs.  Cholecystectomy. EXAM: ABDOMEN ULTRASOUND COMPLETE COMPARISON:  None Available. FINDINGS: Gallbladder: Surgically absent Common bile duct: Diameter: 8 mm Liver: Increased echogenicity throughout the liver. No focal mass. Portal vein is patent on color Doppler imaging with normal direction of blood flow towards the liver. IVC: No abnormality  visualized. Pancreas: Visualized portion unremarkable. Spleen: Size and appearance within normal limits. Right Kidney: Length: 11.3 cm. Echogenicity within normal limits. No mass or hydronephrosis visualized. Left Kidney: Length: 12.8 cm. Echogenicity within normal limits. No mass or hydronephrosis visualized. Abdominal aorta: No aneurysm identified within visualize limits. The distal aorta was obscured by bowel gas. Other findings: None. IMPRESSION: 1. Previous cholecystectomy. 2. The common bile duct measures 8 mm which is within normal limits after cholecystectomy. 3. Probable hepatic steatosis. 4. No other significant abnormalities. Electronically Signed   By: DDorise BullionIII M.D.   On:  04/30/2022 09:28   US Venous Img Lower Bilateral (DVT)  Result Date: 04/27/2022 CLINICAL DATA:  Bilateral lower extremity pain and swelling EXAM: BILATERAL LOWER EXTREMITY VENOUS DOPPLER ULTRASOUND TECHNIQUE: Gray-scale sonography with graded compression, as well as color Doppler and duplex ultrasound were performed to evaluate the lower extremity deep venous systems from the level of the common femoral vein and including the common femoral, femoral, profunda femoral, popliteal and calf veins including the posterior tibial, peroneal and gastrocnemius veins when visible. The superficial great saphenous vein was also interrogated. Spectral Doppler was utilized to evaluate flow at rest and with distal augmentation maneuvers in the common femoral, femoral and popliteal veins. COMPARISON:  None Available. FINDINGS: RIGHT LOWER EXTREMITY Common Femoral Vein: No evidence of thrombus. Normal compressibility, respiratory phasicity and response to augmentation. Saphenofemoral Junction: No evidence of thrombus. Normal compressibility and flow on color Doppler imaging. Profunda Femoral Vein: No evidence of thrombus. Normal compressibility and flow on color Doppler imaging. Femoral Vein: No evidence of thrombus. Normal  compressibility, respiratory phasicity and response to augmentation. Popliteal Vein: No evidence of thrombus. Normal compressibility, respiratory phasicity and response to augmentation. Calf Veins: No evidence of thrombus. Normal compressibility and flow on color Doppler imaging. Superficial Great Saphenous Vein: No evidence of thrombus. Normal compressibility. Venous Reflux:  None. Other Findings:  None. LEFT LOWER EXTREMITY Common Femoral Vein: No evidence of thrombus. Normal compressibility, respiratory phasicity and response to augmentation. Saphenofemoral Junction: No evidence of thrombus. Normal compressibility and flow on color Doppler imaging. Profunda Femoral Vein: No evidence of thrombus. Normal compressibility and flow on color Doppler imaging. Femoral Vein: No evidence of thrombus. Normal compressibility, respiratory phasicity and response to augmentation. Popliteal Vein: No evidence of thrombus. Normal compressibility, respiratory phasicity and response to augmentation. Calf Veins: No evidence of thrombus. Normal compressibility and flow on color Doppler imaging. Superficial Great Saphenous Vein: No evidence of thrombus. Normal compressibility. Venous Reflux:  None. Other Findings:  None. IMPRESSION: No evidence of deep venous thrombosis in either lower extremity. Electronically Signed   By: Jacqulynn Cadet M.D.   On: 04/27/2022 10:33   DG CHEST PORT 1 VIEW  Result Date: 04/29/2022 CLINICAL DATA:  Respiratory distress EXAM: PORTABLE CHEST 1 VIEW COMPARISON:  04/27/2022 FINDINGS: Stable cardiomediastinal contours. Extensive airspace opacity throughout the left lung with minimally improved aeration from prior. Worsening airspace consolidation in the right mid lung. Possible small left effusion. No pneumothorax. IMPRESSION: 1. Worsening airspace consolidation in the right mid lung. 2. Extensive airspace opacity throughout the left lung with slightly improved aeration compared to prior. Electronically  Signed   By: Davina Poke D.O.   On: 04/29/2022 15:01   DG CHEST PORT 1 VIEW  Result Date: 04/27/2022 CLINICAL DATA:  Follow-up lobar pneumonia. EXAM: PORTABLE CHEST 1 VIEW COMPARISON:  Chest x-ray May 15, 23. FINDINGS: Increasing left greater than right airspace opacities. Airspace opacities are confluent in the left lung and patchy in the right lung. No visible pleural effusions or pneumothorax. Cardiac silhouette is largely obscured. No displaced fracture. IMPRESSION: Increasing left greater than right airspace opacities, concerning for worsening pneumonia. These results will be called to the ordering clinician or representative by the Radiologist Assistant, and communication documented in the PACS or [employer]. Electronically Signed   By: Margaretha Sheffield M.D.   On: 04/27/2022 08:07   DG Chest Port 1 View  Result Date: 04/24/2022 CLINICAL DATA:  Short of breath EXAM: PORTABLE CHEST 1 VIEW COMPARISON:  12/14/2020 FINDINGS: New consolidative opacities in the  left lung. No pleural effusion. No pneumothorax. Heart size is normal. IMPRESSION: Multifocal left-sided pneumonia. Electronically Signed   By: Macy Mis M.D.   On: 04/24/2022 07:46   ECHOCARDIOGRAM COMPLETE  Result Date: 04/27/2022    ECHOCARDIOGRAM REPORT   Patient Name:   SHARISSE RANTZ Date of Exam: 04/27/2022 Medical Rec #:  [id number]        Height:       64.0 in Accession #:    [id number]       Weight:       232.1 lb Date of Birth:  05-06-1971         BSA:          2.084 m Patient Age:    7 years         BP:           133/65 mmHg Patient Gender: F                HR:           86 bpm. Exam Location:  [facility] Procedure: 2D Echo, Cardiac Doppler and Color Doppler Indications:    CHF  History:        Patient has no prior history of Echocardiogram examinations.                 CHF; Risk Factors:Hypertension, Diabetes and Dyslipidemia.  Sonographer:    Wenda Low Referring Phys: [phone] DAVID TAT IMPRESSIONS  1. Left  ventricular ejection fraction, by estimation, is 40 to 45%. The left ventricle has mildly decreased function. The left ventricle demonstrates global hypokinesis. The left ventricular internal cavity size was mildly dilated.  2. Right ventricular systolic function is low normal. The right ventricular size is normal. There is mildly elevated pulmonary artery systolic pressure.  3. The mitral valve is normal in structure. Mild mitral valve regurgitation.  4. The aortic valve is tricuspid. Aortic valve regurgitation is not visualized. Aortic valve sclerosis is present, with no evidence of aortic valve stenosis.  5. The inferior vena cava is dilated in size with <50% respiratory variability, suggesting right atrial pressure of 15 mmHg. FINDINGS  Left Ventricle: Left ventricular ejection fraction, by estimation, is 40 to 45%. The left ventricle has mildly decreased function. The left ventricle demonstrates global hypokinesis. The left ventricular internal cavity size was mildly dilated. There is  no left ventricular hypertrophy. Right Ventricle: The right ventricular size is normal. Right vetricular wall thickness was not assessed. Right ventricular systolic function is low normal. There is mildly elevated pulmonary artery systolic pressure. The tricuspid regurgitant velocity is  2.42 m/s, and with an assumed right atrial pressure of 15 mmHg, the estimated right ventricular systolic pressure is 62.6 mmHg. Left Atrium: Left atrial size was normal in size. Right Atrium: Right atrial size was normal in size. Pericardium: There is no evidence of pericardial effusion. Mitral Valve: The mitral valve is normal in structure. Mild mitral valve regurgitation. MV peak gradient, 3.8 mmHg. The mean mitral valve gradient is 2.0 mmHg. Tricuspid Valve: The tricuspid valve is normal in structure. Tricuspid valve regurgitation is mild. Aortic Valve: The aortic valve is tricuspid. Aortic valve regurgitation is not visualized. Aortic valve  sclerosis is present, with no evidence of aortic valve stenosis. Aortic valve mean gradient measures 4.0 mmHg. Aortic valve peak gradient measures 6.9  mmHg. Aortic valve area, by VTI measures 2.16 cm. Pulmonic Valve: The pulmonic valve was grossly normal. Pulmonic valve regurgitation is not visualized. Aorta: The aortic root  is normal in size and structure. Venous: The inferior vena cava is dilated in size with less than 50% respiratory variability, suggesting right atrial pressure of 15 mmHg. IAS/Shunts: No atrial level shunt detected by color flow Doppler.  LEFT VENTRICLE PLAX 2D LVIDd:         5.20 cm     Diastology LVIDs:         3.65 cm     LV e' medial:    7.94 cm/s LV PW:         0.80 cm     LV E/e' medial:  8.7 LV IVS:        1.00 cm     LV e' lateral:   14.90 cm/s LVOT diam:     2.00 cm     LV E/e' lateral: 4.6 LV SV:         57 LV SV Index:   27 LVOT Area:     3.14 cm  LV Volumes (MOD) LV vol d, MOD A2C: 94.9 ml LV vol d, MOD A4C: 81.3 ml LV vol s, MOD A2C: 48.9 ml LV vol s, MOD A4C: 42.9 ml LV SV MOD A2C:     46.0 ml LV SV MOD A4C:     81.3 ml LV SV MOD BP:      42.0 ml RIGHT VENTRICLE RV Basal diam:  3.55 cm RV Mid diam:    3.70 cm RV S prime:     12.90 cm/s TAPSE (M-mode): 2.4 cm LEFT ATRIUM             Index        RIGHT ATRIUM           Index LA diam:        4.30 cm 2.06 cm/m   RA Area:     15.70 cm LA Vol (A2C):   53.4 ml 25.62 ml/m  RA Volume:   44.80 ml  21.49 ml/m LA Vol (A4C):   55.1 ml 26.44 ml/m LA Biplane Vol: 58.7 ml 28.16 ml/m  AORTIC VALVE                    PULMONIC VALVE AV Area (Vmax):    2.27 cm     PV Vmax:       0.66 m/s AV Area (Vmean):   1.99 cm     PV Peak grad:  1.7 mmHg AV Area (VTI):     2.16 cm AV Vmax:           131.00 cm/s AV Vmean:          90.500 cm/s AV VTI:            0.262 m AV Peak Grad:      6.9 mmHg AV Mean Grad:      4.0 mmHg LVOT Vmax:         94.60 cm/s LVOT Vmean:        57.300 cm/s LVOT VTI:          0.180 m LVOT/AV VTI ratio: 0.69  AORTA Ao Root  diam: 2.70 cm MITRAL VALVE               TRICUSPID VALVE MV Area (PHT): 4.17 cm    TR Peak grad:   23.4 mmHg MV Area VTI:   2.46 cm    TR Vmax:        242.00 cm/s MV Peak grad:  3.8 mmHg MV Mean grad:  2.0 mmHg  SHUNTS MV Vmax:       0.98 m/s    Systemic VTI:  0.18 m MV Vmean:      61.6 cm/s   Systemic Diam: 2.00 cm MV Decel Time: 182 msec MV E velocity: 69.00 cm/s MV A velocity: 77.60 cm/s MV E/A ratio:  0.89 Dorris Carnes MD Electronically signed by Dorris Carnes MD Signature Date/Time: 04/27/2022/10:03:09 PM    Final    DG ESOPHAGUS W DOUBLE CM (HD)  Result Date: 04/25/2022 CLINICAL DATA:  Dysphagia, feels like foods are sticking in her chest and lower cervical region, pneumonia EXAM: ESOPHOGRAM / BARIUM SWALLOW / BARIUM TABLET STUDY TECHNIQUE: Combined double contrast and single contrast examination performed using effervescent crystals, thick barium liquid, and thin barium liquid. The patient was observed with fluoroscopy swallowing a 13 mm barium sulphate tablet. FLUOROSCOPY: Radiation Exposure Index (as provided by the fluoroscopic device): 57.0 mGy Kerma COMPARISON:  None Available. FINDINGS: Esophageal distention: Smooth narrowing at/just above gastroesophageal junction. Remainder of esophagus appears to distend normally. Filling defects:  No filling defects 12.5 mm barium tablet: Obstructed at the GE junction and did not pass into stomach despite multiple swallows of water and thick barium. 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 No residuals. 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 Electronically Signed   By: Lavonia Dana M.D.   On: 04/25/2022 10:59     Cardiac Studies   ECHOCARDIOGRAM COMPLETE   Result Date: 04/27/2022  IMPRESSIONS  1.  Left ventricular ejection fraction, by estimation, is 40 to 45%. The left ventricle has mildly decreased function. The left ventricle demonstrates global hypokinesis. The left ventricular internal cavity size was mildly dilated.   2. Right ventricular systolic function is low normal. The right ventricular size is normal. There is mildly elevated pulmonary artery systolic pressure.  3. The mitral valve is normal in structure. Mild mitral valve regurgitation.  4. The aortic valve is tricuspid. Aortic valve regurgitation is not visualized. Aortic valve sclerosis is present, with no evidence of aortic valve stenosis.  5. The inferior vena cava is dilated in size with <50% respiratory variability, suggesting right atrial pressure of 15 mmHg.   FINDINGS  Left Ventricle: Left ventricular ejection fraction, by estimation, is 40 to 45%. The left ventricle has mildly decreased function. The left ventricle demonstrates global hypokinesis. The left ventricular internal cavity size was mildly dilated. There is  no left ventricular hypertrophy. Right Ventricle: The right ventricular size is normal. Right vetricular wall thickness was not assessed. Right ventricular systolic function is low normal. There is mildly elevated pulmonary artery systolic pressure. The tricuspid regurgitant velocity is  2.42 m/s, and with an assumed right atrial pressure of 15 mmHg, the estimated right ventricular systolic pressure is 27.0 mmHg. Left Atrium: Left atrial size was normal in size. Right Atrium: Right atrial size was normal in size. Pericardium: There is no evidence of pericardial effusion. Mitral Valve: The mitral valve is normal in structure. Mild mitral valve regurgitation. MV peak gradient, 3.8 mmHg. The mean mitral valve gradient is 2.0 mmHg. Tricuspid Valve: The tricuspid valve is normal in structure. Tricuspid valve regurgitation is mild. Aortic Valve: The aortic valve is tricuspid. Aortic valve regurgitation is not visualized.  Aortic valve sclerosis is present, with no evidence of aortic valve stenosis. Aortic valve mean gradient measures  4.0 mmHg. Aortic valve peak gradient measures 6.9  mmHg. Aortic valve area, by VTI measures 2.16 cm. Pulmonic Valve: The pulmonic valve was grossly normal. Pulmonic valve regurgitation is not visualized. Aorta: The aortic root is normal in size and structure. Venous: The inferior vena cava is dilated in size with less than 50% respiratory variability, suggesting right atrial pressure of 15 mmHg. IAS/Shunts: No atrial level shunt detected by color flow Doppler.  LEFT VENTRICLE PLAX 2D LVIDd:         5.20 cm     Diastology LVIDs:         3.65 cm     LV e' medial:    7.94 cm/s LV PW:         0.80 cm     LV E/e' medial:  8.7 LV IVS:        1.00 cm     LV e' lateral:   14.90 cm/s LVOT diam:     2.00 cm     LV E/e' lateral: 4.6 LV SV:         57 LV SV Index:   27 LVOT Area:     3.14 cm  LV Volumes (MOD) LV vol d, MOD A2C: 94.9 ml LV vol d, MOD A4C: 81.3 ml LV vol s, MOD A2C: 48.9 ml LV vol s, MOD A4C: 42.9 ml LV SV MOD A2C:     46.0 ml LV SV MOD A4C:     81.3 ml LV SV MOD BP:      42.0 ml RIGHT VENTRICLE RV Basal diam:  3.55 cm RV Mid diam:    3.70 cm RV S prime:     12.90 cm/s TAPSE (M-mode): 2.4 cm LEFT ATRIUM             Index        RIGHT ATRIUM           Index LA diam:        4.30 cm 2.06 cm/m   RA Area:     15.70 cm LA Vol (A2C):   53.4 ml 25.62 ml/m  RA Volume:   44.80 ml  21.49 ml/m LA Vol (A4C):   55.1 ml 26.44 ml/m LA Biplane Vol: 58.7 ml 28.16 ml/m  AORTIC VALVE                    PULMONIC VALVE AV Area (Vmax):    2.27 cm     PV Vmax:       0.66 m/s AV Area (Vmean):   1.99 cm     PV Peak grad:  1.7 mmHg AV Area (VTI):     2.16 cm AV Vmax:           131.00 cm/s AV Vmean:          90.500 cm/s AV VTI:            0.262 m AV Peak Grad:      6.9 mmHg AV Mean Grad:      4.0 mmHg LVOT Vmax:         94.60 cm/s LVOT Vmean:        57.300 cm/s LVOT VTI:          0.180 m LVOT/AV VTI ratio: 0.69  AORTA  Ao Root diam: 2.70 cm MITRAL VALVE               TRICUSPID VALVE MV Area (PHT): 4.17 cm    TR Peak grad:  23.4 mmHg MV Area VTI:   2.46 cm    TR Vmax:        242.00 cm/s MV Peak grad:  3.8 mmHg MV Mean grad:  2.0 mmHg    SHUNTS MV Vmax:       0.98 m/s    Systemic VTI:  0.18 m MV Vmean:      61.6 cm/s   Systemic Diam: 2.00 cm MV Decel Time: 182 msec MV E velocity: 69.00 cm/s MV A velocity: 77.60 cm/s MV E/A ratio:  0.89 Dorris Carnes MD Electronically signed by Dorris Carnes MD Signature Date/Time: 04/27/2022/10:03:09 PM    Final       Patient Profile     51 y.o. female  with HLD with DM, Anemia and diverticulosis, presenting with PNA.  Found to have low EF as part of her evaluation who is being seen 04/28/2022 for the evaluation of HF.  Assessment & Plan     New cardiomyopathy LVEF 40-45% on echo in the setting of sepsis, pneumonia. Multiple CV risk factors, no chest pain.   - Plan to repeat echocardiogram as outpatient once recovers, if persistent systolic dysfunction will need ischemic evaluation.  - low dose bisoprolol 2.5 mg started 05/23, SBP 90s-100s but she is asymptomatic.   - f/u appt. made 05/30/22 with Bernerd Pho 3:30.   Acute Systolic CHF I/O's negative 12.1 L since admission - lasix 40 mg po daily started 05/24, no output recorded - O2 sats 95% on 1.5 L   Acute respiratory failure with hypoxia in setting of multilobar pneumonia left lung - was requiring 12 L O2 but down 1.5 L   - CT 05/21 no PE, diffuse patchy and confluent ground glass opacities in both lungs.   Uncontrolled DM2  - A1C 11.5, per IM   HLD  - continue Crestor   Tobacco abuse - says she will quit         For questions or updates, please contact [place] Please consult [email or web address] for contact info under        Signed, Rosaria Ferries, PA-C  05/04/2022, 7:59 AM    Patient seen and examined and agree with Rosaria Ferries, PA-C as detailed above.  In brief, the patient is a 51 year old female  with history of DMII, HLD, and asthma who presented with SOB and malaise found to have multilobar pneumonia with course complicated by new acute systolic heart failure with LVEF 40-45% for which Cardiology has been consulted.  TTE 40-45% with global hypokinesis, mild MR, mild pulmonary HTN, RAP 57mHg. Currently on bisoprolol, lasix '40mg'$  PO daily, and spironolactone. Further med titration has been limited by soft blood pressure. Will continue current regimen and plan for outpatient follow-up. Will need repeat TTE as outpatient to reassess LVEF once recovered from acute illness.   GEN: No acute distress.   Neck: No JVD Cardiac: RRR, no murmurs, rubs, or gallops.  Respiratory: Rhonchi at left lung base. Otherwise clear GI: Soft, nontender, non-distended  MS: No edema; No deformity. Neuro:  Nonfocal  Psych: Normal affect    Plan: -Continue bisoprolol 2.'5mg'$  daily -Continue lasix '40mg'$  PO daily -Continue spiro 12.'5mg'$  daily -Cannot tolerate ARNI/ARB due to soft BP -Consider addition of SGLT2i as outpatient -Will plan for repeat TTE to reassess LVEF as outpatient -Management of pneumonia per primary  Cardiology will sign-off. Please call with questions or concerns.  HGwyndolyn Kaufman MD

## 2022-05-04 NOTE — Discharge Summary (Signed)
Physician Discharge Summary  Kelli [name] HQP:[id number] DOB: August 28, 1971 DOA: 04/24/2022  PCP: Elkins Nation, MD GI: Ardis Hughs [facility]: [facility] Admit date: 04/24/2022 Discharge date: 05/04/2022  Admitted From:  Home  Disposition: Home   Recommendations for Outpatient Follow-up:  Follow up with PCP in 1 weeks Please check BMP in 1 week to check electrolytes  Please follow up with Dr Ardis Hughs in 2 weeks for hospital followup  Please follow up with cardiology on 05/30/22 as scheduled   Discharge Condition: Stable   CODE STATUS: Full DIET: Dysphagia 1 diet    Brief Hospitalization Summary: Please see all hospital notes, images, labs for full details of the hospitalization. Kelli [name] is a 51 y.o. female with medical history significant of prior history of asthma (per patient not a big issue lately and has never been using her home inhaler), type 2 diabetes mellitus, depression, gastroesophageal reflux disease, hyperlipidemia class II obesity; who presented to the emergency department secondary to productive coughing spells, shortness of breath, general malaise and dysuria.  Patient reports symptom has been present for the last 3-4 days and worsening.  She has noticed that in the last 24 hours she having very winded with minimal activity and just feeling completely drained.  Reports productive coughing spells (yellowish/greenish mucus, no hemoptysis), expressed being exposed to her grandkids (with URI symptoms) reported experiencing chills. Patient denies chest pain, no nausea or vomiting, pain, no hematuria, no hematochezia, no melena, no focal weakness, headache or any other complaints.   In the ED chest x-ray demonstrated multilobar pneumonia affecting her left lung, she was leukopenic, tachypneic, tachycardic and hypoxic.  Lactic acid 4.2.  Cultures were taken, fluid resuscitation per sepsis protocol initiated and IV antibiotics started.  [facility] contacted to place patient in the  hospital for further evaluation and management.   Of note, COVID/influenza PCR negative.   05/03/2022: Pt reports that she is only needing oxygen with ambulation to bathroom.   05/04/2022: EGD with dilation today with GI service.  Update 6:15 pm: Pt insisting on going home tonight.  I had planned to monitor overnight after dilation and likely DC tomorrow but patient insists on going home now and now willing to wait longer.  DC home.  Follow up recommendations given to patient and she verbalized understanding.     EGD 05/04/22: Impression: -Slight dilation of the esophageal body -status post Maloney dilation as described above. Abnormal gastric mucosa -query portal hypertensive gastropathy versus H. pylori infection, etc. Status post gastric biopsy.                           -Endoscopic findings suspicious for achalasia. May Will need further evaluation in the coming weeks with manometry, etc.  Recommendation:     -Dysphagia 1 diet. Daily PPI. Close interval follow-up with Dr. Ardis Hughs post discharge. Follow-up on pathology.   HOSPITAL COURSE BY PROBLEM LIST   Assessment and Plan: * Acute respiratory failure with hypoxia (HCC) -In the setting of multilobar pneumonia (left Lung) -Patient oxygen saturation in the mid 80s on room air; -need of 8 L high flow nasal cannula to maintain saturation above 92%. -now up to 12 L>>9L>>3L>>2L>1.5L -completed 5 days ceftriaxone/zithromax -Patient reporting difficulty swallowing for over 2 weeks and with high concern for aspiration.   Speech therapy has seen patient and at this moment recommendation for GI involvement -repeat BNP--113 -repeat PCT--1.31 -IS and flutter valve 4/20--repeat CXR--personally reviewed>>increase RLL opacity 5/21 CTA chest--No PE--Diffuse patchy and confluent  ground-glass opacities throughout both lungs  Community acquired pneumonia of left lower lobe of lung -- fully treated in hospital.   Constipation - miralax ordered as  needed  Pressure injury of skin -- continue skin care protocol per RN  Pressure Injury 05/02/22 Sacrum Mid Stage 2 -  Partial thickness loss of dermis presenting as a shallow open injury with a red, pink wound bed without slough. (Active)  05/02/22 1300  Location: Sacrum  Location Orientation: Mid  Staging: Stage 2 -  Partial thickness loss of dermis presenting as a shallow open injury with a red, pink wound bed without slough.  Wound Description (Comments):   Present on Admission: No    Hypomagnesemia repleted  Acute systolic CHF (congestive heart failure)/ new cardiomyopathy 5/18--personally reviewed CXR--increase vascular congestion and R-opacities 5/18 Echo--EF 40-45%, low normal RV; mild MR/TR She was treated with lasix 40 IV bid>>po lasix on 5/24 Accurate I/Os--incomplete Hold SGLT for outpatient follow up consideration per cardiology service Spironolactone added 05/02/22  Hypokalemia Repleted mag--repleted  Lobar pneumonia ([place]) 5/18 CXR--personally reviewed--increase vascular congestion, increase R-opacities Check PCT--4.24>>2.07>>1.31 Finished 5 days ceftriaxone/azithro -finished 5 days ceftriaxone and azithro and 2 days zosyn (7 days total abx) BNP--1360>>113 5/21 CTA chest--No PE--Diffuse patchy and confluent ground-glass opacities throughout both lungs -Weaned oxygen to room air  -This has been fully treated.   Dysphagia - Per patient ongoing for over 2 weeks -Affecting liquids and solids in different locations. 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 -EGD planned for 05/03/22 - Please see operative note from Dr. Gala Romney.  -Dysphagia 1 diet with thin liquids and close supervision has been recommended by speech therapy   Severe sepsis [facility]) -Meeting severe sepsis criteria at time of admission with leukopenia, fever, tachycardia, presence of lactic acidosis (lactic acid 4.2), increased respiratory rate and the  presence of hypoxia as part of organ dysfunction. -Lactic acid peaked 4.2 -Patient ended requiring up to 8 L high flow>>12L>>3L -finished 5 days ceftriaxone and azithro and 2 days zosyn (7 days total abx) -sepsis physiology resolved   Chronic diarrhea -In the setting of IBS -Continue the use of Questran and Florastor. -Appears to be stable/improved overall to resolved now.   Depression -No suicidal ideation or hallucinations -Continue Lexapro and Wellbutrin.  Hyperlipidemia -Continue statin  Uncontrolled type 2 diabetes mellitus with hyperglycemia, without long-term current use of insulin ([place]) -Patient with hyperglycemia -04/24/22 A1c 11.5 -Continue sliding scale insulin and Levemir; follow CBGs and adjust hypoglycemia regimen as needed. -Holding oral hypoglycemic agents while inpatient. CBGs largely controlled currently Continue levemir 15 units daily, resume metformin ER at 1000 mg BID CBG (last 3)  Recent Labs    05/04/22 0750 05/04/22 1228 05/04/22 1447  GLUCAP 190* 179* 161*     Discharge Diagnoses:  Principal Problem:   Acute respiratory failure with hypoxia (HCC) Active Problems:   Uncontrolled type 2 diabetes mellitus with hyperglycemia, without long-term current use of insulin (HCC)   Hyperlipidemia   Depression   Chronic diarrhea   Severe sepsis (HCC)   Dysphagia   Lobar pneumonia (HCC)   Hypokalemia   Acute systolic CHF (congestive heart failure)/ new cardiomyopathy   Hypomagnesemia   Pressure injury of skin   Constipation   Community acquired pneumonia of left lower lobe of lung   Discharge Instructions: Discharge Instructions     Ambulatory referral to Gastroenterology   Complete by: As directed    Hospital Follow up Dr. Ardis Hughs   What is the  reason for referral?: Other      Allergies as of 05/04/2022       Reactions   Aspirin    Vomiting and nausea   Neosporin [neomycin-bacitracin Zn-polymyx] Rash   "blisters"        Medication  List     STOP taking these medications    cholestyramine 4 g packet Commonly known as: Questran   hyoscyamine 0.125 MG tablet Commonly known as: Levsin       TAKE these medications    acetaminophen 325 MG tablet Commonly known as: TYLENOL Take 650 mg by mouth as needed.   albuterol 108 (90 Base) MCG/ACT inhaler Commonly known as: VENTOLIN HFA Inhale 1 puff into the lungs as needed.   bisoprolol 5 MG tablet Commonly known as: ZEBETA Take 0.5 tablets (2.5 mg total) by mouth daily. Start taking on: May 05, 2022   blood glucose meter kit and supplies Dispense based on patient and insurance preference. Use up to four times daily as directed. (FOR ICD-10 E10.9, E11.9).   buPROPion 150 MG 24 hr tablet Commonly known as: WELLBUTRIN XL Take 150 mg by mouth daily.   escitalopram 20 MG tablet Commonly known as: LEXAPRO Take 20 mg by mouth daily.   furosemide 40 MG tablet Commonly known as: LASIX Take 1 tablet (40 mg total) by mouth daily. Start taking on: May 05, 2022   hydrOXYzine 10 MG tablet Commonly known as: ATARAX Take 10-20 mg by mouth at bedtime as needed for anxiety (slleping).   insulin detemir 100 UNIT/ML FlexPen Commonly known as: LEVEMIR Inject 15 Units into the skin daily.   Insulin Pen Needle 31G X 5 MM Misc 1 Device by Does not apply route as directed.   metFORMIN 500 MG 24 hr tablet Commonly known as: GLUCOPHAGE-XR Take 2 tablets (1,000 mg total) by mouth 2 (two) times daily with a meal. What changed:  how much to take when to take this   pantoprazole 40 MG tablet Commonly known as: PROTONIX Take 1 tablet (40 mg total) by mouth daily. Start taking on: May 05, 2022   polyethylene glycol 17 g packet Commonly known as: MIRALAX / GLYCOLAX Take 17 g by mouth daily as needed for mild constipation.   potassium chloride 10 MEQ tablet Commonly known as: KLOR-CON M Take 1 tablet (10 mEq total) by mouth daily. Start taking on: May 05, 2022    rosuvastatin 5 MG tablet Commonly known as: CRESTOR Take 1 tablet (5 mg total) by mouth daily.   spironolactone 25 MG tablet Commonly known as: ALDACTONE Take 0.5 tablets (12.5 mg total) by mouth daily. Start taking on: May 05, 2022        Follow-up Information     Milus Banister, MD. Schedule an appointment as soon as possible for a visit in 1 week(s).   Specialty: Gastroenterology Why: Hospital Follow Up Contact information: [address] [address] [phone]         Erma Heritage, PA-C. Go on 05/30/2022.   Specialties: Physician Assistant, Cardiology Why: Hospital Follow Up Contact information: [place] [address] [phone]         [place] Nation, MD. Schedule an appointment as soon as possible for a visit in 1 week(s).   Specialty: Internal Medicine Why: Hospital Follow Up and check labs Contact information: [place] [address] [phone]  Allergies  Allergen Reactions   Aspirin     Vomiting and nausea   Neosporin [Neomycin-Bacitracin Zn-Polymyx] Rash    "blisters"   Allergies as of 05/04/2022       Reactions   Aspirin    Vomiting and nausea   Neosporin [neomycin-bacitracin Zn-polymyx] Rash   "blisters"        Medication List     STOP taking these medications    cholestyramine 4 g packet Commonly known as: Questran   hyoscyamine 0.125 MG tablet Commonly known as: Levsin       TAKE these medications    acetaminophen 325 MG tablet Commonly known as: TYLENOL Take 650 mg by mouth as needed.   albuterol 108 (90 Base) MCG/ACT inhaler Commonly known as: VENTOLIN HFA Inhale 1 puff into the lungs as needed.   bisoprolol 5 MG tablet Commonly known as: ZEBETA Take 0.5 tablets (2.5 mg total) by mouth daily. Start taking on: May 05, 2022   blood glucose meter kit and supplies Dispense based on patient and insurance preference. Use up to four times  daily as directed. (FOR ICD-10 E10.9, E11.9).   buPROPion 150 MG 24 hr tablet Commonly known as: WELLBUTRIN XL Take 150 mg by mouth daily.   escitalopram 20 MG tablet Commonly known as: LEXAPRO Take 20 mg by mouth daily.   furosemide 40 MG tablet Commonly known as: LASIX Take 1 tablet (40 mg total) by mouth daily. Start taking on: May 05, 2022   hydrOXYzine 10 MG tablet Commonly known as: ATARAX Take 10-20 mg by mouth at bedtime as needed for anxiety (slleping).   insulin detemir 100 UNIT/ML FlexPen Commonly known as: LEVEMIR Inject 15 Units into the skin daily.   Insulin Pen Needle 31G X 5 MM Misc 1 Device by Does not apply route as directed.   metFORMIN 500 MG 24 hr tablet Commonly known as: GLUCOPHAGE-XR Take 2 tablets (1,000 mg total) by mouth 2 (two) times daily with a meal. What changed:  how much to take when to take this   pantoprazole 40 MG tablet Commonly known as: PROTONIX Take 1 tablet (40 mg total) by mouth daily. Start taking on: May 05, 2022   polyethylene glycol 17 g packet Commonly known as: MIRALAX / GLYCOLAX Take 17 g by mouth daily as needed for mild constipation.   potassium chloride 10 MEQ tablet Commonly known as: KLOR-CON M Take 1 tablet (10 mEq total) by mouth daily. Start taking on: May 05, 2022   rosuvastatin 5 MG tablet Commonly known as: CRESTOR Take 1 tablet (5 mg total) by mouth daily.   spironolactone 25 MG tablet Commonly known as: ALDACTONE Take 0.5 tablets (12.5 mg total) by mouth daily. Start taking on: May 05, 2022        Procedures/Studies: CT Angio Chest Pulmonary Embolism (PE) W or WO Contrast  Result Date: 04/30/2022 CLINICAL DATA:  High probability for PE. Pleuritic chest pain, hypoxia and dyspnea. EXAM: CT ANGIOGRAPHY CHEST WITH CONTRAST TECHNIQUE: Multidetector CT imaging of the chest was performed using the standard protocol during bolus administration of intravenous contrast. Multiplanar CT image  reconstructions and MIPs were obtained to evaluate the vascular anatomy. RADIATION DOSE REDUCTION: This exam was performed according to the departmental dose-optimization program which includes automated exposure control, adjustment of the mA and/or kV according to patient size and/or use of iterative reconstruction technique. CONTRAST:  18mL OMNIPAQUE IOHEXOL 350 MG/ML SOLN COMPARISON:  None Available. FINDINGS: Cardiovascular: Satisfactory opacification of the pulmonary arteries to  the segmental level. No evidence of pulmonary embolism. Normal heart size. No pericardial effusion. Mediastinum/Nodes: No enlarged mediastinal, hilar, or axillary lymph nodes. Thyroid gland, trachea, and esophagus demonstrate no significan seen. T findings. Lungs/Pleura: There are patchy and confluent ground-glass opacities throughout both lungs most significant in the upper lobes. Airspace consolidation is seen in the lingula and left lower lobe with air bronchograms. There are trace bilateral pleural effusions. No pneumothorax. Trachea and central airways are patent. Upper Abdomen: Cholecystectomy clips are present. Musculoskeletal: Degenerative changes affect the spine. No acute fractures are Review of the MIP images confirms the above findings. IMPRESSION: 1. No evidence for pulmonary embolism. 2. Diffuse patchy and confluent ground-glass opacities throughout both lungs. Findings are nonspecific and may be related to pulmonary edema, hemorrhage or infection. 3. Lingular and left lower lobe airspace consolidation compatible with pneumonia. 4. Trace bilateral pleural effusions. Electronically Signed   By: Ronney Asters M.D.   On: 04/30/2022 16:25   US Abdomen Complete  Result Date: 04/30/2022 CLINICAL DATA:  Abnormal LFTs.  Cholecystectomy. EXAM: ABDOMEN ULTRASOUND COMPLETE COMPARISON:  None Available. FINDINGS: Gallbladder: Surgically absent Common bile duct: Diameter: 8 mm Liver: Increased echogenicity throughout the liver. No  focal mass. Portal vein is patent on color Doppler imaging with normal direction of blood flow towards the liver. IVC: No abnormality visualized. Pancreas: Visualized portion unremarkable. Spleen: Size and appearance within normal limits. Right Kidney: Length: 11.3 cm. Echogenicity within normal limits. No mass or hydronephrosis visualized. Left Kidney: Length: 12.8 cm. Echogenicity within normal limits. No mass or hydronephrosis visualized. Abdominal aorta: No aneurysm identified within visualize limits. The distal aorta was obscured by bowel gas. Other findings: None. IMPRESSION: 1. Previous cholecystectomy. 2. The common bile duct measures 8 mm which is within normal limits after cholecystectomy. 3. Probable hepatic steatosis. 4. No other significant abnormalities. Electronically Signed   By: Dorise Bullion III M.D.   On: 04/30/2022 09:28   US Venous Img Lower Bilateral (DVT)  Result Date: 04/27/2022 CLINICAL DATA:  Bilateral lower extremity pain and swelling EXAM: BILATERAL LOWER EXTREMITY VENOUS DOPPLER ULTRASOUND TECHNIQUE: Gray-scale sonography with graded compression, as well as color Doppler and duplex ultrasound were performed to evaluate the lower extremity deep venous systems from the level of the common femoral vein and including the common femoral, femoral, profunda femoral, popliteal and calf veins including the posterior tibial, peroneal and gastrocnemius veins when visible. The superficial great saphenous vein was also interrogated. Spectral Doppler was utilized to evaluate flow at rest and with distal augmentation maneuvers in the common femoral, femoral and popliteal veins. COMPARISON:  None Available. FINDINGS: RIGHT LOWER EXTREMITY Common Femoral Vein: No evidence of thrombus. Normal compressibility, respiratory phasicity and response to augmentation. Saphenofemoral Junction: No evidence of thrombus. Normal compressibility and flow on color Doppler imaging. Profunda Femoral Vein: No  evidence of thrombus. Normal compressibility and flow on color Doppler imaging. Femoral Vein: No evidence of thrombus. Normal compressibility, respiratory phasicity and response to augmentation. Popliteal Vein: No evidence of thrombus. Normal compressibility, respiratory phasicity and response to augmentation. Calf Veins: No evidence of thrombus. Normal compressibility and flow on color Doppler imaging. Superficial Great Saphenous Vein: No evidence of thrombus. Normal compressibility. Venous Reflux:  None. Other Findings:  None. LEFT LOWER EXTREMITY Common Femoral Vein: No evidence of thrombus. Normal compressibility, respiratory phasicity and response to augmentation. Saphenofemoral Junction: No evidence of thrombus. Normal compressibility and flow on color Doppler imaging. Profunda Femoral Vein: No evidence of thrombus. Normal compressibility and flow on color  Doppler imaging. Femoral Vein: No evidence of thrombus. Normal compressibility, respiratory phasicity and response to augmentation. Popliteal Vein: No evidence of thrombus. Normal compressibility, respiratory phasicity and response to augmentation. Calf Veins: No evidence of thrombus. Normal compressibility and flow on color Doppler imaging. Superficial Great Saphenous Vein: No evidence of thrombus. Normal compressibility. Venous Reflux:  None. Other Findings:  None. IMPRESSION: No evidence of deep venous thrombosis in either lower extremity. Electronically Signed   By: Jacqulynn Cadet M.D.   On: 04/27/2022 10:33   DG CHEST PORT 1 VIEW  Result Date: 04/29/2022 CLINICAL DATA:  Respiratory distress EXAM: PORTABLE CHEST 1 VIEW COMPARISON:  04/27/2022 FINDINGS: Stable cardiomediastinal contours. Extensive airspace opacity throughout the left lung with minimally improved aeration from prior. Worsening airspace consolidation in the right mid lung. Possible small left effusion. No pneumothorax. IMPRESSION: 1. Worsening airspace consolidation in the right mid  lung. 2. Extensive airspace opacity throughout the left lung with slightly improved aeration compared to prior. Electronically Signed   By: Davina Poke D.O.   On: 04/29/2022 15:01   DG CHEST PORT 1 VIEW  Result Date: 04/27/2022 CLINICAL DATA:  Follow-up lobar pneumonia. EXAM: PORTABLE CHEST 1 VIEW COMPARISON:  Chest x-ray May 15, 23. FINDINGS: Increasing left greater than right airspace opacities. Airspace opacities are confluent in the left lung and patchy in the right lung. No visible pleural effusions or pneumothorax. Cardiac silhouette is largely obscured. No displaced fracture. IMPRESSION: Increasing left greater than right airspace opacities, concerning for worsening pneumonia. These results will be called to the ordering clinician or representative by the Radiologist Assistant, and communication documented in the PACS or [employer]. Electronically Signed   By: Margaretha Sheffield M.D.   On: 04/27/2022 08:07   DG Chest Port 1 View  Result Date: 04/24/2022 CLINICAL DATA:  Short of breath EXAM: PORTABLE CHEST 1 VIEW COMPARISON:  12/14/2020 FINDINGS: New consolidative opacities in the left lung. No pleural effusion. No pneumothorax. Heart size is normal. IMPRESSION: Multifocal left-sided pneumonia. Electronically Signed   By: Macy Mis M.D.   On: 04/24/2022 07:46   ECHOCARDIOGRAM COMPLETE  Result Date: 04/27/2022    ECHOCARDIOGRAM REPORT   Patient Name:   Kelli [name] Date of Exam: 04/27/2022 Medical Rec #:  [id number]        Height:       64.0 in Accession #:    [id number]       Weight:       232.1 lb Date of Birth:  13-Nov-1971         BSA:          2.084 m Patient Age:    50 years         BP:           133/65 mmHg Patient Gender: F                HR:           86 bpm. Exam Location:  [facility] Procedure: 2D Echo, Cardiac Doppler and Color Doppler Indications:    CHF  History:        Patient has no prior history of Echocardiogram examinations.                 CHF; Risk  Factors:Hypertension, Diabetes and Dyslipidemia.  Sonographer:    Wenda Low Referring Phys: [phone] DAVID TAT IMPRESSIONS  1. Left ventricular ejection fraction, by estimation, is 40 to 45%. The left ventricle has mildly decreased function. The left  ventricle demonstrates global hypokinesis. The left ventricular internal cavity size was mildly dilated.  2. Right ventricular systolic function is low normal. The right ventricular size is normal. There is mildly elevated pulmonary artery systolic pressure.  3. The mitral valve is normal in structure. Mild mitral valve regurgitation.  4. The aortic valve is tricuspid. Aortic valve regurgitation is not visualized. Aortic valve sclerosis is present, with no evidence of aortic valve stenosis.  5. The inferior vena cava is dilated in size with <50% respiratory variability, suggesting right atrial pressure of 15 mmHg. FINDINGS  Left Ventricle: Left ventricular ejection fraction, by estimation, is 40 to 45%. The left ventricle has mildly decreased function. The left ventricle demonstrates global hypokinesis. The left ventricular internal cavity size was mildly dilated. There is  no left ventricular hypertrophy. Right Ventricle: The right ventricular size is normal. Right vetricular wall thickness was not assessed. Right ventricular systolic function is low normal. There is mildly elevated pulmonary artery systolic pressure. The tricuspid regurgitant velocity is  2.42 m/s, and with an assumed right atrial pressure of 15 mmHg, the estimated right ventricular systolic pressure is 56.3 mmHg. Left Atrium: Left atrial size was normal in size. Right Atrium: Right atrial size was normal in size. Pericardium: There is no evidence of pericardial effusion. Mitral Valve: The mitral valve is normal in structure. Mild mitral valve regurgitation. MV peak gradient, 3.8 mmHg. The mean mitral valve gradient is 2.0 mmHg. Tricuspid Valve: The tricuspid valve is normal in structure. Tricuspid  valve regurgitation is mild. Aortic Valve: The aortic valve is tricuspid. Aortic valve regurgitation is not visualized. Aortic valve sclerosis is present, with no evidence of aortic valve stenosis. Aortic valve mean gradient measures 4.0 mmHg. Aortic valve peak gradient measures 6.9  mmHg. Aortic valve area, by VTI measures 2.16 cm. Pulmonic Valve: The pulmonic valve was grossly normal. Pulmonic valve regurgitation is not visualized. Aorta: The aortic root is normal in size and structure. Venous: The inferior vena cava is dilated in size with less than 50% respiratory variability, suggesting right atrial pressure of 15 mmHg. IAS/Shunts: No atrial level shunt detected by color flow Doppler.  LEFT VENTRICLE PLAX 2D LVIDd:         5.20 cm     Diastology LVIDs:         3.65 cm     LV e' medial:    7.94 cm/s LV PW:         0.80 cm     LV E/e' medial:  8.7 LV IVS:        1.00 cm     LV e' lateral:   14.90 cm/s LVOT diam:     2.00 cm     LV E/e' lateral: 4.6 LV SV:         57 LV SV Index:   27 LVOT Area:     3.14 cm  LV Volumes (MOD) LV vol d, MOD A2C: 94.9 ml LV vol d, MOD A4C: 81.3 ml LV vol s, MOD A2C: 48.9 ml LV vol s, MOD A4C: 42.9 ml LV SV MOD A2C:     46.0 ml LV SV MOD A4C:     81.3 ml LV SV MOD BP:      42.0 ml RIGHT VENTRICLE RV Basal diam:  3.55 cm RV Mid diam:    3.70 cm RV S prime:     12.90 cm/s TAPSE (M-mode): 2.4 cm LEFT ATRIUM  Index        RIGHT ATRIUM           Index LA diam:        4.30 cm 2.06 cm/m   RA Area:     15.70 cm LA Vol (A2C):   53.4 ml 25.62 ml/m  RA Volume:   44.80 ml  21.49 ml/m LA Vol (A4C):   55.1 ml 26.44 ml/m LA Biplane Vol: 58.7 ml 28.16 ml/m  AORTIC VALVE                    PULMONIC VALVE AV Area (Vmax):    2.27 cm     PV Vmax:       0.66 m/s AV Area (Vmean):   1.99 cm     PV Peak grad:  1.7 mmHg AV Area (VTI):     2.16 cm AV Vmax:           131.00 cm/s AV Vmean:          90.500 cm/s AV VTI:            0.262 m AV Peak Grad:      6.9 mmHg AV Mean Grad:      4.0  mmHg LVOT Vmax:         94.60 cm/s LVOT Vmean:        57.300 cm/s LVOT VTI:          0.180 m LVOT/AV VTI ratio: 0.69  AORTA Ao Root diam: 2.70 cm MITRAL VALVE               TRICUSPID VALVE MV Area (PHT): 4.17 cm    TR Peak grad:   23.4 mmHg MV Area VTI:   2.46 cm    TR Vmax:        242.00 cm/s MV Peak grad:  3.8 mmHg MV Mean grad:  2.0 mmHg    SHUNTS MV Vmax:       0.98 m/s    Systemic VTI:  0.18 m MV Vmean:      61.6 cm/s   Systemic Diam: 2.00 cm MV Decel Time: 182 msec MV E velocity: 69.00 cm/s MV A velocity: 77.60 cm/s MV E/A ratio:  0.89 Dorris Carnes MD Electronically signed by Dorris Carnes MD Signature Date/Time: 04/27/2022/10:03:09 PM    Final    DG ESOPHAGUS W DOUBLE CM (HD)  Result Date: 04/25/2022 CLINICAL DATA:  Dysphagia, feels like foods are sticking in her chest and lower cervical region, pneumonia EXAM: ESOPHOGRAM / BARIUM SWALLOW / BARIUM TABLET STUDY TECHNIQUE: Combined double contrast and single contrast examination performed using effervescent crystals, thick barium liquid, and thin barium liquid. The patient was observed with fluoroscopy swallowing a 13 mm barium sulphate tablet. FLUOROSCOPY: Radiation Exposure Index (as provided by the fluoroscopic device): 57.0 mGy Kerma COMPARISON:  None Available. FINDINGS: Esophageal distention: Smooth narrowing at/just above gastroesophageal junction. Remainder of esophagus appears to distend normally. Filling defects:  No filling defects 12.5 mm barium tablet: Obstructed at the GE junction and did not pass into stomach despite multiple swallows of water and thick barium. 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 No residuals. 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 Electronically Signed   By: Lavonia Dana M.D.   On: 04/25/2022 10:59  Subjective: Pt insisting on going home after procedure today.  She is not willing to stay another night for observation.   Discharge Exam: Vitals:   05/04/22 1611 05/04/22 1710  BP: 112/63   Pulse: 64   Resp: 18   Temp: (!) 97.4 F (36.3 C)   SpO2: 95% 94%   Vitals:   05/04/22 1545 05/04/22 1553 05/04/22 1611 05/04/22 1710  BP: 104/63 101/63 112/63   Pulse: 64  64   Resp: $Remo'19 20 18   'nvnuW$ Temp:  97.6 F (36.4 C) (!) 97.4 F (36.3 C)   TempSrc:   Oral   SpO2: 90% 95% 95% 94%  Weight:      Height:        General: Pt is alert, awake, not in acute distress Cardiovascular: RRR, S1/S2 +, no rubs, no gallops Respiratory: CTA bilaterally, no wheezing, no rhonchi Abdominal: Soft, NT, ND, bowel sounds + Extremities: no edema, no cyanosis   The results of significant diagnostics from this hospitalization (including imaging, microbiology, ancillary and laboratory) are listed below for reference.     Microbiology: No results found for this or any previous visit (from the past 240 hour(s)).   Labs: BNP (last 3 results) Recent Labs    04/24/22 0735 04/27/22 0458 04/29/22 1616  BNP 209.0* 1,360.0* 998.3*   Basic Metabolic Panel: Recent Labs  Lab 04/28/22 0342 04/29/22 0109 05/01/22 0426 05/02/22 0437 05/03/22 0445  NA 141 141 136 137 136  K 3.4* 2.9* 3.3* 3.5 3.9  CL 100 95* 91* 92* 94*  CO2 29 36* 31 35* 34*  GLUCOSE 164* 184* 126* 154* 154*  BUN $Re'16 15 17 16 13  'BuA$ CREATININE 0.40* 0.38* 0.40* 0.41* 0.34*  CALCIUM 8.1* 7.9* 7.7* 7.9* 8.0*  MG 1.6* 1.9 1.9 1.9 1.9   Liver Function Tests: Recent Labs  Lab 04/29/22 1616 05/01/22 0426 05/02/22 0437  AST 42* 26 20  ALT 72* 43 31  ALKPHOS 340* 262* 225*  BILITOT 0.6 0.9 0.6  PROT 5.0* 5.2* 5.1*  ALBUMIN 2.0* 1.9* 1.8*   No results for input(s): LIPASE, AMYLASE in the last 168 hours. No results for input(s): AMMONIA in the last 168 hours. CBC: Recent Labs  Lab 04/29/22 1616  04/30/22 0418  WBC 9.5 10.0  HGB 12.8 12.5  HCT 37.9 36.9  MCV 99.5 100.0  PLT 117* 127*   Cardiac Enzymes: No results for input(s): CKTOTAL, CKMB, CKMBINDEX, TROPONINI in the last 168 hours. BNP: Invalid input(s): POCBNP CBG: Recent Labs  Lab 05/03/22 2105 05/04/22 0750 05/04/22 1228 05/04/22 1447 05/04/22 1707  GLUCAP 181* 190* 179* 161* 167*   D-Dimer No results for input(s): DDIMER in the last 72 hours. Hgb A1c No results for input(s): HGBA1C in the last 72 hours. Lipid Profile No results for input(s): CHOL, HDL, LDLCALC, TRIG, CHOLHDL, LDLDIRECT in the last 72 hours. Thyroid function studies No results for input(s): TSH, T4TOTAL, T3FREE, THYROIDAB in the last 72 hours.  Invalid input(s): FREET3 Anemia work up No results for input(s): VITAMINB12, FOLATE, FERRITIN, TIBC, IRON, RETICCTPCT in the last 72 hours. Urinalysis    Component Value Date/Time   COLORURINE AMBER (A) 04/24/2022 0715   APPEARANCEUR CLEAR 04/24/2022 0715   LABSPEC 1.024 04/24/2022 0715   PHURINE 6.0 04/24/2022 0715   GLUCOSEU >=500 (A) 04/24/2022 0715   HGBUR NEGATIVE 04/24/2022 0715   BILIRUBINUR NEGATIVE 04/24/2022 0715   BILIRUBINUR n 09/11/2016 1605   KETONESUR 5 (A) 04/24/2022 0715   PROTEINUR NEGATIVE 04/24/2022 0715  UROBILINOGEN negative 09/11/2016 1605   UROBILINOGEN 0.2 12/24/2007 2234   NITRITE POSITIVE (A) 04/24/2022 0715   LEUKOCYTESUR NEGATIVE 04/24/2022 0715   Sepsis Labs Invalid input(s): PROCALCITONIN,  WBC,  LACTICIDVEN Microbiology No results found for this or any previous visit (from the past 240 hour(s)).  Time coordinating discharge: 35 mins  SIGNED:  Irwin Brakeman, MD  [facility] 05/04/2022, 6:21 PM How to contact the [facility] Attending or Consulting provider [place] or covering provider during after hours [place], for this patient?  Check the care team in [facility] and look for a) attending/consulting TRH provider listed and b) the [facility] team listed Log into  [email or web address] and use [place]'s universal password to access. If you do not have the password, please contact the hospital operator. Locate the [facility] provider you are looking for under [facility] and page to a number that you can be directly reached. If you still have difficulty reaching the provider, please page the [facility] (Director on Call) for the Hospitalists listed on amion for assistance.

## 2022-05-04 NOTE — Anesthesia Postprocedure Evaluation (Signed)
Anesthesia Post Note  Patient: Kelli [name]  Procedure(s) Performed: ESOPHAGOGASTRODUODENOSCOPY (EGD) WITH PROPOFOL ESOPHAGEAL DILATION BIOPSY  Patient location during evaluation: PACU Anesthesia Type: General Level of consciousness: awake and alert and oriented Pain management: pain level controlled Vital Signs Assessment: post-procedure vital signs reviewed and stable Respiratory status: spontaneous breathing, nonlabored ventilation, respiratory function stable and patient connected to nasal cannula oxygen Cardiovascular status: blood pressure returned to baseline and stable Postop Assessment: no apparent nausea or vomiting Anesthetic complications: no   No notable events documented.   Last Vitals:  Vitals:   05/04/22 1553 05/04/22 1611  BP: 101/63 112/63  Pulse:  64  Resp: 20 18  Temp: 36.4 C (!) 36.3 C  SpO2: 95% 95%    Last Pain:  Vitals:   05/04/22 1611  TempSrc: Oral  PainSc:                  Merrily Tegeler C Seanpaul Preece

## 2022-05-04 NOTE — Interval H&P Note (Signed)
History and Physical Interval Note:  05/04/2022 3:00 PM  Kelli [name]  has presented today for surgery, with the diagnosis of Esophageal dysphagia.  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  The various methods of treatment have been discussed with the patient and family. After consideration of risks, benefits and other options for treatment, the patient has consented to  Procedure(s): ESOPHAGOGASTRODUODENOSCOPY (EGD) WITH PROPOFOL (N/A) ESOPHAGEAL DILATION (N/A) as a surgical intervention.  The patient's history has been reviewed, patient examined, no change in status, stable for surgery.  I have reviewed the patient's chart and labs.  Questions were answered to the patient's satisfaction.     Manus Rudd   patient seen and examined in short stay.  Respiratory status much improved over that seen on admission.  Barium pill esophagram reviewed.   Agree with need for EGD with esophageal dilation as feasible/appropriate per plan.   Discussed specifically the possible etiologies including tumor, stricture and achalasia.  I explained that even if her esophagus is stretched today that may not take care of her problem and  further evaluation i.e. manometry, etc. may be needed.  Her questions been answered.  She is agreeable..   Heparin discontinued yesterday.   Discussed with anesthesia.  Further recommendations to follow after EGD has been carried out.

## 2022-05-04 NOTE — Transfer of Care (Signed)
Immediate Anesthesia Transfer of Care Note  Patient: Kelli [name]  Procedure(s) Performed: ESOPHAGOGASTRODUODENOSCOPY (EGD) WITH PROPOFOL ESOPHAGEAL DILATION BIOPSY  Patient Location: PACU  Anesthesia Type:MAC  Level of Consciousness: awake, alert , oriented and patient cooperative  Airway & Oxygen Therapy: Patient Spontanous Breathing and Patient connected to nasal cannula oxygen  Post-op Assessment: Report given to RN, Post -op Vital signs reviewed and stable and Patient moving all extremities  Post vital signs: Reviewed and stable  Last Vitals:  Vitals Value Taken Time  BP 99/83 05/04/22 1533  Temp    Pulse 72 05/04/22 1534  Resp 10 05/04/22 1534  SpO2 92 % 05/04/22 1534  Vitals shown include unvalidated device data.  Last Pain:  Vitals:   05/04/22 1456  TempSrc:   PainSc: 7       Patients Stated Pain Goal: 0 (52/84/13 2440)  Complications: No notable events documented.

## 2022-05-04 NOTE — Assessment & Plan Note (Signed)
-   miralax ordered as needed

## 2022-05-04 NOTE — Progress Notes (Signed)
PROGRESS NOTE   Kelli [name]  QMG:[id number] DOB: 17-Mar-1971 DOA: 04/24/2022 PCP:  [place], MD   Chief Complaint  Patient presents with   Shortness of Breath   Level of care: Telemetry  Brief Admission History:  Kelli [name] is a 51 y.o. female with medical history significant of prior history of asthma (per patient not a big issue lately and has never been using her home inhaler), type 2 diabetes mellitus, depression, gastroesophageal reflux disease, hyperlipidemia class II obesity; who presented to the emergency department secondary to productive coughing spells, shortness of breath, general malaise and dysuria.  Patient reports symptom has been present for the last 3-4 days and worsening.  She has noticed that in the last 24 hours she having very winded with minimal activity and just feeling completely drained.  Reports productive coughing spells (yellowish/greenish mucus, no hemoptysis), expressed being exposed to her grandkids (with URI symptoms) reported experiencing chills. Patient denies chest pain, no nausea or vomiting, pain, no hematuria, no hematochezia, no melena, no focal weakness, headache or any other complaints.   In the ED chest x-ray demonstrated multilobar pneumonia affecting her left lung, she was leukopenic, tachypneic, tachycardic and hypoxic.  Lactic acid 4.2.  Cultures were taken, fluid resuscitation per sepsis protocol initiated and IV antibiotics started.  TRH contacted to place patient in the hospital for further evaluation and management.   Of note, COVID/influenza PCR negative.   05/03/2022: Pt reports that she is only needing oxygen with ambulation to bathroom.   05/04/2022: EGD with dilation today with GI service.     Assessment and Plan: * Acute respiratory failure with hypoxia (HCC) -In the setting of multilobar pneumonia (left Lung) -Patient oxygen saturation in the mid 80s on room air; -need of 8 L high flow nasal cannula to maintain  saturation above 92%. -now up to 12 L>>9L>>3L>>2L>1.5L -completed 5 days ceftriaxone/zithromax -Patient reporting difficulty swallowing for over 2 weeks and with high concern for aspiration.   Speech therapy has seen patient and at this moment recommendation for GI involvement -repeat BNP--113 -repeat PCT--1.31 -IS and flutter valve 4/20--repeat CXR--personally reviewed>>increase RLL opacity 5/21 CTA chest--No PE--Diffuse patchy and confluent ground-glass opacities throughout both lungs GI planning EGD later today.  Follow up results and recommendations.   Pressure injury of skin -- continue skin care protocol per RN  Pressure Injury 05/02/22 Sacrum Mid Stage 2 -  Partial thickness loss of dermis presenting as a shallow open injury with a red, pink wound bed without slough. (Active)  05/02/22 1300  Location: Sacrum  Location Orientation: Mid  Staging: Stage 2 -  Partial thickness loss of dermis presenting as a shallow open injury with a red, pink wound bed without slough.  Wound Description (Comments):   Present on Admission: No    Hypomagnesemia repleted  Acute systolic CHF (congestive heart failure)/ new cardiomyopathy 5/18--personally reviewed CXR--increase vascular congestion and R-opacities 5/18 Echo--EF 40-45%, low normal RV; mild MR/TR Continue lasix 40 IV bid>>po lasix on 5/24 Accurate I/Os--incomplete Hold SGLT due to sepsis Spironolactone added 05/02/22  Hypokalemia Repleted mag--repleted  Lobar pneumonia (Kelli [name]) 5/18 CXR--personally reviewed--increase vascular congestion, increase R-opacities Check PCT--4.24>>2.07>>1.31 Finished 5 days ceftriaxone/azithro -finished 5 days ceftriaxone and azithro and 2 days zosyn (7 days total abx) BNP--1360>>113 5/21 CTA chest--No PE--Diffuse patchy and confluent ground-glass opacities throughout both lungs -Wean oxygen to room air   Dysphagia - Per patient ongoing for over 2 weeks -Affecting liquids and solids in different  locations. -5/16 esophagram--Smooth stricture at GE junction  obstructing 12.5 mm diameter barium tablet. -EGD planned for 05/03/22 -Dysphagia 1 diet with thin liquids and close supervision has been recommended by speech therapy   Severe sepsis ([name]) -Meeting severe sepsis criteria at time of admission with leukopenia, fever, tachycardia, presence of lactic acidosis (lactic acid 4.2), increased respiratory rate and the presence of hypoxia as part of organ dysfunction. -Lactic acid peaked 4.2 -Patient ended requiring up to 8 L high flow>>12L>>3L -finished 5 days ceftriaxone and azithro and 2 days zosyn (7 days total abx) -sepsis physiology resolved   Chronic diarrhea -In the setting of IBS -Continue the use of Questran and Florastor. -Appears to be stable/improved overall to resolved now.   Depression -No suicidal ideation or hallucinations -Continue Lexapro and Wellbutrin.  Hyperlipidemia -Continue statin  Uncontrolled type 2 diabetes mellitus with hyperglycemia, without long-term current use of insulin ([name]) -Patient with hyperglycemia -04/24/22 A1c 11.5 -Continue sliding scale insulin and Levemir; follow CBGs and adjust hypoglycemia regimen as needed. -Holding oral hypoglycemic agents while inpatient. CBGs largely controlled currently continue novolog 5 units TIW CBG (last 3)  Recent Labs    05/04/22 0750 05/04/22 1228 05/04/22 1447  GLUCAP 190* 179* 161*     DVT prophylaxis: SQ heparin  Code Status: Full  Family Communication:  Disposition: Status is: Inpatient Remains inpatient appropriate because:    Consultants:  GI [facility]iology [facility] Procedures:   Antimicrobials:    Subjective: Pt reported that she wants to go home, still waiting on EGD with dilation to be done.    Objective: Vitals:   05/04/22 1535 05/04/22 1545 05/04/22 1553 05/04/22 1611  BP: 99/83 104/63 101/63 112/63  Pulse: 72 64  64  Resp: '10 19 20 18  '$ Temp: 98 F (36.7 C)  97.6 F (36.4  C) (!) 97.4 F (36.3 C)  TempSrc:    Oral  SpO2: 92% 90% 95% 95%  Weight:      Height:        Intake/Output Summary (Last 24 hours) at 05/04/2022 1649 Last data filed at 05/04/2022 1523 Gross per 24 hour  Intake 540 ml  Output --  Net 540 ml   Filed Weights   05/01/22 0607 05/02/22 0547 05/04/22 1354  Weight: 92.8 kg 94 kg 93.4 kg   Examination:  General exam: Appears calm and comfortable  Respiratory system: no crackles or rales heard. Cardiovascular system: normal S1 & S2 heard. No JVD, murmurs, rubs, gallops or clicks. No pedal edema. Gastrointestinal system: Abdomen is nondistended, soft and nontender. No organomegaly or masses felt. Normal bowel sounds heard. Central nervous system: Alert and oriented. No focal neurological deficits. Extremities: Symmetric 5 x 5 power. Skin: No rashes, lesions or ulcers. Psychiatry: Judgement and insight appear normal. Mood & affect appropriate.   Data Reviewed: I have personally reviewed following labs and imaging studies  CBC: Recent Labs  Lab 04/29/22 1616 04/30/22 0418  WBC 9.5 10.0  HGB 12.8 12.5  HCT 37.9 36.9  MCV 99.5 100.0  PLT 117* 127*    Basic Metabolic Panel: Recent Labs  Lab 04/28/22 0342 04/29/22 0109 05/01/22 0426 05/02/22 0437 05/03/22 0445  NA 141 141 136 137 136  K 3.4* 2.9* 3.3* 3.5 3.9  CL 100 95* 91* 92* 94*  CO2 29 36* 31 35* 34*  GLUCOSE 164* 184* 126* 154* 154*  BUN '16 15 17 16 13  '$ CREATININE 0.40* 0.38* 0.40* 0.41* 0.34*  CALCIUM 8.1* 7.9* 7.7* 7.9* 8.0*  MG 1.6* 1.9 1.9 1.9 1.9    CBG: Recent  Labs  Lab 05/03/22 1131 05/03/22 2105 05/04/22 0750 05/04/22 1228 05/04/22 1447  GLUCAP 179* 181* 190* 179* 161*    No results found for this or any previous visit (from the past 240 hour(s)).    Radiology Studies: No results found.  Scheduled Meds:  bisoprolol  2.5 mg Oral Daily   budesonide (PULMICORT) nebulizer solution  0.5 mg Nebulization BID   buPROPion  150 mg Oral Daily    chlorhexidine  15 mL Mouth Rinse BID   dextromethorphan-guaiFENesin  1 tablet Oral BID   escitalopram  10 mg Oral Daily   furosemide  40 mg Oral Daily   insulin aspart  0-15 Units Subcutaneous TID WC   insulin aspart  0-5 Units Subcutaneous QHS   insulin aspart  5 Units Subcutaneous TID WC   insulin detemir  15 Units Subcutaneous Daily   ipratropium  0.5 mg Nebulization BID   levalbuterol  0.63 mg Nebulization BID   mouth rinse  15 mL Mouth Rinse q12n4p   pantoprazole  40 mg Oral Daily   polyethylene glycol  17 g Oral Daily   potassium chloride  40 mEq Oral Daily   rosuvastatin  5 mg Oral Daily   saccharomyces boulardii  250 mg Oral BID   spironolactone  12.5 mg Oral Daily   Continuous Infusions:   LOS: 10 days   Time spent: 35 mins  Nawaf Strange Wynetta Emery, MD How to contact the [facility] Attending or Consulting provider 7A - 7P or covering provider during after hours [place], for this patient?  Check the care team in [facility] and look for a) attending/consulting TRH provider listed and b) the [facility] team listed Log into [email or web address] and use [place]'s universal password to access. If you do not have the password, please contact the hospital operator. Locate the [facility] provider you are looking for under [facility] and page to a number that you can be directly reached. If you still have difficulty reaching the provider, please page the [facility] (Director on Call) for the Hospitalists listed on amion for assistance.  05/04/2022, 4:49 PM

## 2022-05-04 NOTE — Anesthesia Preprocedure Evaluation (Signed)
Anesthesia Evaluation  Patient identified by MRN, date of birth, ID band Patient awake    Reviewed: Allergy & Precautions, NPO status , Patient's Chart, lab work & pertinent test results  Airway Mallampati: II  TM Distance: >3 FB Neck ROM: Full    Dental  (+) Dental Advisory Given, Missing   Pulmonary pneumonia, Current Smoker and Patient abstained from smoking.,    Pulmonary exam normal breath sounds clear to auscultation       Cardiovascular hypertension, Pt. on medications +CHF  Normal cardiovascular exam+ Valvular Problems/Murmurs  Rhythm:Regular Rate:Normal  1. Left ventricular ejection fraction, by estimation, is 40 to 45%. The left ventricle has mildly decreased function. The left ventricle demonstrates global hypokinesis. The left ventricular internal cavity size was mildly dilated.  2. Right ventricular systolic function is low normal. The right ventricular size is normal. There is mildly elevated pulmonary artery systolic pressure.  3. The mitral valve is normal in structure. Mild mitral valve  regurgitation.  4. The aortic valve is tricuspid. Aortic valve regurgitation is not visualized. Aortic valve sclerosis is present, with no evidence of aortic valve stenosis.  5. The inferior vena cava is dilated in size with <50% respiratory variability, suggesting right atrial pressure of 15 mmHg.   Neuro/Psych  Headaches, PSYCHIATRIC DISORDERS Anxiety Depression    GI/Hepatic Neg liver ROS, GERD  Medicated,  Endo/Other  diabetes, Well Controlled, Type 2, Oral Hypoglycemic Agents  Renal/GU negative Renal ROS  negative genitourinary   Musculoskeletal  (+) Arthritis , Osteoarthritis,    Abdominal   Peds negative pediatric ROS (+)  Hematology  (+) Blood dyscrasia, anemia ,   Anesthesia Other Findings Severe sepsis  Reproductive/Obstetrics negative OB ROS                              Anesthesia Physical Anesthesia Plan  ASA: 4  Anesthesia Plan: General   Post-op Pain Management: Minimal or no pain anticipated   Induction: Intravenous  PONV Risk Score and Plan: Propofol infusion  Airway Management Planned: Nasal Cannula and Natural Airway  Additional Equipment:   Intra-op Plan:   Post-operative Plan: Possible Post-op intubation/ventilation  Informed Consent: I have reviewed the patients History and Physical, chart, labs and discussed the procedure including the risks, benefits and alternatives for the proposed anesthesia with the patient or authorized representative who has indicated his/her understanding and acceptance.     Dental advisory given  Plan Discussed with: CRNA and Surgeon  Anesthesia Plan Comments:         Anesthesia Quick Evaluation

## 2022-05-09 ENCOUNTER — Encounter: Payer: Self-pay | Admitting: Internal Medicine

## 2022-05-09 LAB — GLUCOSE, CAPILLARY
Glucose-Capillary: 220 mg/dL — ABNORMAL HIGH (ref 70–99)
Glucose-Capillary: 172 mg/dL — ABNORMAL HIGH (ref 70–99)
Glucose-Capillary: 298 mg/dL — ABNORMAL HIGH (ref 70–99)

## 2022-05-09 LAB — SURGICAL PATHOLOGY

## 2022-05-10 ENCOUNTER — Encounter (HOSPITAL_COMMUNITY): Payer: Self-pay | Admitting: Internal Medicine

## 2022-05-15 ENCOUNTER — Encounter: Payer: Self-pay | Admitting: Family Medicine

## 2022-05-25 ENCOUNTER — Emergency Department (HOSPITAL_COMMUNITY)
Admission: EM | Admit: 2022-05-25 | Discharge: 2022-05-25 | Disposition: A | Discharge status: Home / Self Care | Payer: PRIVATE HEALTH INSURANCE | Attending: Student | Admitting: Student

## 2022-05-25 ENCOUNTER — Encounter (HOSPITAL_COMMUNITY): Payer: Self-pay | Admitting: *Deleted

## 2022-05-25 ENCOUNTER — Other Ambulatory Visit: Payer: Self-pay

## 2022-05-25 ENCOUNTER — Emergency Department (HOSPITAL_COMMUNITY): Payer: PRIVATE HEALTH INSURANCE

## 2022-05-25 DIAGNOSIS — E1165 Type 2 diabetes mellitus with hyperglycemia: Secondary | ICD-10-CM | POA: Insufficient documentation

## 2022-05-25 DIAGNOSIS — Z794 Long term (current) use of insulin: Secondary | ICD-10-CM | POA: Diagnosis not present

## 2022-05-25 DIAGNOSIS — I509 Heart failure, unspecified: Secondary | ICD-10-CM | POA: Diagnosis not present

## 2022-05-25 DIAGNOSIS — I110 Hypertensive heart disease with heart failure: Secondary | ICD-10-CM | POA: Insufficient documentation

## 2022-05-25 DIAGNOSIS — N3000 Acute cystitis without hematuria: Secondary | ICD-10-CM | POA: Diagnosis not present

## 2022-05-25 DIAGNOSIS — R1031 Right lower quadrant pain: Secondary | ICD-10-CM | POA: Diagnosis present

## 2022-05-25 DIAGNOSIS — Z7984 Long term (current) use of oral hypoglycemic drugs: Secondary | ICD-10-CM | POA: Diagnosis not present

## 2022-05-25 LAB — URINALYSIS, ROUTINE W REFLEX MICROSCOPIC
Bilirubin Urine: NEGATIVE
Glucose, UA: >=500 mg/dL — AB
Hgb urine dipstick: NEGATIVE
Ketones, ur: NEGATIVE mg/dL
Nitrite: NEGATIVE
Protein, ur: NEGATIVE mg/dL
Specific Gravity, Urine: 1.014 (ref 1.005–1.030)
pH: 6 (ref 5.0–8.0)

## 2022-05-25 LAB — COMPREHENSIVE METABOLIC PANEL
ALT: 14 U/L (ref 0–44)
AST: 13 U/L — ABNORMAL LOW (ref 15–41)
Albumin: 3.6 g/dL (ref 3.5–5.0)
Alkaline Phosphatase: 108 U/L (ref 38–126)
Anion gap: 11 (ref 5–15)
BUN: 11 mg/dL (ref 6–20)
CO2: 25 mmol/L (ref 22–32)
Calcium: 8.7 mg/dL — ABNORMAL LOW (ref 8.9–10.3)
Chloride: 96 mmol/L — ABNORMAL LOW (ref 98–111)
Creatinine, Ser: 0.63 mg/dL (ref 0.44–1.00)
GFR, Estimated: >60 mL/min (ref >60)
Glucose, Bld: 464 mg/dL — ABNORMAL HIGH (ref 70–99)
Potassium: 4 mmol/L (ref 3.5–5.1)
Sodium: 132 mmol/L — ABNORMAL LOW (ref 135–145)
Total Bilirubin: 0.8 mg/dL (ref 0.3–1.2)
Total Protein: 6.5 g/dL (ref 6.5–8.1)

## 2022-05-25 LAB — CBC WITH DIFFERENTIAL/PLATELET
Abs Immature Granulocytes: 0.03 10*3/uL (ref 0.00–0.07)
Basophils Absolute: 0.1 10*3/uL (ref 0.0–0.1)
Basophils Relative: 1 %
Eosinophils Absolute: 0.1 10*3/uL (ref 0.0–0.5)
Eosinophils Relative: 1 %
HCT: 41.1 % (ref 36.0–46.0)
Hemoglobin: 14.1 g/dL (ref 12.0–15.0)
Immature Granulocytes: 0 %
Lymphocytes Relative: 39 %
Lymphs Abs: 3.7 10*3/uL (ref 0.7–4.0)
MCH: 33.2 pg (ref 26.0–34.0)
MCHC: 34.3 g/dL (ref 30.0–36.0)
MCV: 96.7 fL (ref 80.0–100.0)
Monocytes Absolute: 0.6 10*3/uL (ref 0.1–1.0)
Monocytes Relative: 6 %
Neutro Abs: 5 10*3/uL (ref 1.7–7.7)
Neutrophils Relative %: 53 %
Platelets: 195 10*3/uL (ref 150–400)
RBC: 4.25 MIL/uL (ref 3.87–5.11)
RDW: 13.1 % (ref 11.5–15.5)
WBC: 9.5 10*3/uL (ref 4.0–10.5)
nRBC: 0 % (ref 0.0–0.2)

## 2022-05-25 LAB — LIPASE, BLOOD: Lipase: 48 U/L (ref 11–51)

## 2022-05-25 MED ORDER — FLUCONAZOLE 150 MG PO TABS: 150.0000 mg | ORAL_TABLET | Freq: Every day | ORAL | 0 refills | Status: AC

## 2022-05-25 MED ORDER — LIDOCAINE 5 % EX PTCH: 1.0000 | MEDICATED_PATCH | CUTANEOUS | 0 refills | Status: DC

## 2022-05-25 MED ORDER — MORPHINE SULFATE (PF) 4 MG/ML IV SOLN
4.0000 mg | Freq: Once | INTRAVENOUS | Status: AC
  Administered 2022-05-25: 4 mg via INTRAVENOUS
  Filled 2022-05-25: qty 1

## 2022-05-25 MED ORDER — METHOCARBAMOL 1000 MG PO TABS: 1000.0000 mg | ORAL_TABLET | Freq: Two times a day (BID) | ORAL | 0 refills | Status: DC

## 2022-05-25 MED ORDER — IOHEXOL 300 MG/ML  SOLN
100.0000 mL | Freq: Once | INTRAMUSCULAR | Status: AC | PRN
  Administered 2022-05-25: 100 mL via INTRAVENOUS

## 2022-05-25 MED ORDER — ONDANSETRON HCL 4 MG PO TABS: 4.0000 mg | ORAL_TABLET | Freq: Four times a day (QID) | ORAL | 0 refills | Status: DC

## 2022-05-25 MED ORDER — SODIUM CHLORIDE 0.9 % IV BOLUS
500.0000 mL | Freq: Once | INTRAVENOUS | Status: AC
  Administered 2022-05-25: 500 mL via INTRAVENOUS

## 2022-05-25 MED ORDER — NITROFURANTOIN MONOHYD MACRO 100 MG PO CAPS: 100.0000 mg | ORAL_CAPSULE | Freq: Two times a day (BID) | ORAL | 0 refills | Status: DC

## 2022-05-25 NOTE — ED Triage Notes (Signed)
Pt with abd pain and lower back pain.  Recently discharged on 5/25 for PNA/CHF. Generalized abd pain.  Pt states since released pt with numbness to right buttock. +nausea

## 2022-05-25 NOTE — ED Provider Notes (Signed)
[facility] EMERGENCY DEPARTMENT Provider Note   CSN: [id number] Arrival date & time: 05/25/22  1233     History  Chief Complaint  Patient presents with   Abdominal Pain    Kelli [name] is a 51 y.o. female with a past medical history of type 2 diabetes, hypertension, heart failure and recently discharged from the hospital after being septic secondary to pneumonia presenting today for right abdominal and buttock pain since her hospital discharge on 5/25.  She says she first had a pressure ulcer but this is since resolved.  Says that she is having midline lumbar spine pain that radiates down her right buttocks.  She is having difficulty sleeping and getting comfortable at night.  Additionally she is having bilateral back pain that she says she usually feels when she has a urinary tract infection.  No dysuria or hematuria.  No vaginal symptoms.  History of cholecystectomy but is wondering if she may have a problem with her appendix due to her right-sided abdominal pain.  No fevers, chills, nausea, vomiting or diarrhea.   Abdominal Pain      Home Medications Prior to Admission medications   Medication Sig Start Date End Date Taking? Authorizing Provider  acetaminophen (TYLENOL) 325 MG tablet Take 650 mg by mouth as needed.    [provider]  albuterol (VENTOLIN HFA) 108 (90 Base) MCG/ACT inhaler Inhale 1 puff into the lungs as needed. 01/30/20   [provider]  bisoprolol (ZEBETA) 5 MG tablet Take 0.5 tablets (2.5 mg total) by mouth daily. 05/05/22   Murlean Iba, MD  blood glucose meter kit and supplies Dispense based on patient and insurance preference. Use up to four times daily as directed. (FOR ICD-10 E10.9, E11.9). 05/04/22   Johnson, Clanford L, MD  buPROPion (WELLBUTRIN XL) 150 MG 24 hr tablet Take 150 mg by mouth daily. 04/06/22   [provider]  escitalopram (LEXAPRO) 20 MG tablet Take 20 mg by mouth daily. 03/23/22   [provider]   furosemide (LASIX) 40 MG tablet Take 1 tablet (40 mg total) by mouth daily. 05/05/22   Johnson, Clanford L, MD  hydrOXYzine (ATARAX) 10 MG tablet Take 10-20 mg by mouth at bedtime as needed for anxiety (slleping). 04/06/22   [provider]  insulin detemir (LEVEMIR) 100 UNIT/ML FlexPen Inject 15 Units into the skin daily. 05/04/22   Johnson, Clanford L, MD  Insulin Pen Needle 31G X 5 MM MISC 1 Device by Does not apply route as directed. 05/04/22   Johnson, Clanford L, MD  metFORMIN (GLUCOPHAGE-XR) 500 MG 24 hr tablet Take 2 tablets (1,000 mg total) by mouth 2 (two) times daily with a meal. 05/04/22   Johnson, Clanford L, MD  pantoprazole (PROTONIX) 40 MG tablet Take 1 tablet (40 mg total) by mouth daily. 05/05/22   Johnson, Clanford L, MD  polyethylene glycol (MIRALAX / GLYCOLAX) 17 g packet Take 17 g by mouth daily as needed for mild constipation. 05/04/22   Johnson, Clanford L, MD  potassium chloride SA (KLOR-CON M) 10 MEQ tablet Take 1 tablet (10 mEq total) by mouth daily. 05/05/22   Johnson, Clanford L, MD  rosuvastatin (CRESTOR) 5 MG tablet Take 1 tablet (5 mg total) by mouth daily. 05/04/22   Johnson, Clanford L, MD  spironolactone (ALDACTONE) 25 MG tablet Take 0.5 tablets (12.5 mg total) by mouth daily. 05/05/22   Murlean Iba, MD      Allergies    Aspirin and Neosporin [neomycin-bacitracin zn-polymyx]  Review of Systems   Review of Systems  Gastrointestinal:  Positive for abdominal pain.    Physical Exam Updated Vital Signs BP 117/74 (BP Location: Right Arm)   Pulse 69   Temp (!) 97.4 F (36.3 C) (Temporal)   Resp 15   Ht $R'5\' 4"'up$  (1.626 m)   Wt 86.2 kg   LMP 11/25/2019   SpO2 99%   BMI 32.61 kg/m  Physical Exam Vitals and nursing note reviewed.  Constitutional:      Appearance: Normal appearance.  HENT:     Head: Normocephalic and atraumatic.  Eyes:     General: No scleral icterus.    Conjunctiva/sclera: Conjunctivae normal.  Pulmonary:     Effort:  Pulmonary effort is normal. No respiratory distress.  Abdominal:     General: Abdomen is flat.     Palpations: Abdomen is soft.     Tenderness: There is abdominal tenderness in the right lower quadrant and suprapubic area. There is no right CVA tenderness or left CVA tenderness. Negative signs include Murphy's sign and McBurney's sign.  Skin:    General: Skin is warm and dry.     Findings: No rash.  Neurological:     Mental Status: She is alert.  Psychiatric:        Mood and Affect: Mood normal.     ED Results / Procedures / Treatments   Labs (all labs ordered are listed, but only abnormal results are displayed) Labs Reviewed  COMPREHENSIVE METABOLIC PANEL - Abnormal; Notable for the following components:      Result Value   Sodium 132 (*)    Chloride 96 (*)    Glucose, Bld 464 (*)    Calcium 8.7 (*)    AST 13 (*)    All other components within normal limits  URINALYSIS, ROUTINE W REFLEX MICROSCOPIC - Abnormal; Notable for the following components:   Glucose, UA >=500 (*)    Leukocytes,Ua LARGE (*)    Bacteria, UA MANY (*)    All other components within normal limits  URINE CULTURE  LIPASE, BLOOD  CBC WITH DIFFERENTIAL/PLATELET    EKG None  Radiology CT ABDOMEN PELVIS W CONTRAST  Result Date: 05/25/2022 CLINICAL DATA:  Abdominal pain, acute, nonlocalized. EXAM: CT ABDOMEN AND PELVIS WITH CONTRAST TECHNIQUE: Multidetector CT imaging of the abdomen and pelvis was performed using the standard protocol following bolus administration of intravenous contrast. RADIATION DOSE REDUCTION: This exam was performed according to the departmental dose-optimization program which includes automated exposure control, adjustment of the mA and/or kV according to patient size and/or use of iterative reconstruction technique. CONTRAST:  158mL OMNIPAQUE IOHEXOL 300 MG/ML  SOLN COMPARISON:  Ultrasound 04/30/2022 FINDINGS: Lower chest: The lungs are now clear, except for what looks like mild  residual scarring at the left lung base. Hepatobiliary: No liver parenchymal abnormality. Previous cholecystectomy. Mild ductal prominence, typical following cholecystectomy. Pancreas: Normal Spleen: Normal Adrenals/Urinary Tract: Adrenal glands are normal. The kidneys are normal except for a nonobstructing 2 mm stone in the lower pole on the left. The bladder is normal. Stomach/Bowel: Question distal esophageal wall thickening. The stomach appears normal. The small bowel is normal. No abnormal colon finding. Vascular/Lymphatic: Mild aortic atherosclerosis. No aneurysm. IVC is normal. No adenopathy. Reproductive: No pelvic mass. Other: No free fluid or air. Musculoskeletal: Ordinary lower lumbar degenerative changes. IMPRESSION: Lung bases now clear except for minimal scarring at the left base. Question distal esophageal thickening suggesting esophagitis. Previous cholecystectomy. Mild ductal prominence typical of prior cholecystectomy. 2  mm nonobstructing stone in the lower pole the left kidney. Electronically Signed   By: Nelson Chimes M.D.   On: 05/25/2022 15:32    Procedures Procedures   Medications Ordered in ED Medications  morphine (PF) 4 MG/ML injection 4 mg (4 mg Intravenous Given 05/25/22 1351)  sodium chloride 0.9 % bolus 500 mL (500 mLs Intravenous New Bag/Given 05/25/22 1352)  iohexol (OMNIPAQUE) 300 MG/ML solution 100 mL (100 mLs Intravenous Contrast Given 05/25/22 1507)    ED Course/ Medical Decision Making/ A&P                           Medical Decision Making Amount and/or Complexity of Data Reviewed Labs: ordered. Radiology: ordered.  Risk Prescription drug management.   This patient presents to the ED for concern of abdominal pain. The differential diagnosis for generalized abdominal pain includes, but is not limited to AAA, gastroenteritis, appendicitis, Bowel obstruction, Bowel perforation. Gastroparesis, DKA, Hernia, Inflammatory bowel disease, mesenteric ischemia,  pancreatitis, peritonitis SBP, volvulus.    This is not an exhaustive differential.    Past Medical History / Co-morbidities / Social History: Uncontrolled type 2 diabetes, recent hospitalization   Additional history: Chart reviewed.  Patient was discharged from the hospital on 5/25.  It was suggested that she stay 1 more night but she says she cannot get comfortable.  Was complaining of similar back pain that she is complaining of today.  At that time her blood sugar was extremely uncontrolled.  Patient did have a urinary tract infection during her hospitalization on 5/15.  She was on antibiotics for community-acquired pneumonia and it was assumed that these antibiotics would treat her UTI (Rocephin and Zithromax)   Physical Exam: Pertinent physical exam findings include -Generalized abdominal tenderness, worse in the RLQ  Lab Tests: I ordered, and personally interpreted labs.  The pertinent results include:  -Leukocyte positive bacteriuria -Blood glucose 464 with glucosuria reflecting her poorly controlled diabetes.  No signs of HHS   Imaging Studies: I ordered and independently visualized and interpreted imaging which showed normal appendix.  Question esophagitis and left-sided small kidney stone. I agree with the radiologist interpretation.     Medications: I ordered medication including IVF and morphine. Reevaluation of the patient after these medicines showed that the patient improved. I have reviewed the patients home medicines and have made adjustments as needed.   Disposition: After consideration of the diagnostic results and the patients response to treatment, I feel that patient does not have an emergent cause of abdominal pain.  Normal white count, pain is resolved, afebrile and not tachycardic.  She was notified that her diabetes still seems to be uncontrolled and she has an appointment with her PCP tomorrow.  She may talk about all of these concerns with them tomorrow.  I  will treat her for her urinary tract infection and send her lidocaine patches and muscle relaxants for her muscular pain.  I suspect a lot of this is musculoskeletal secondary to 10 days in a hospital bed.  She is agreeable to discharge and follow-up  Of note, patient did not have a catheter during her hospitalization and I will treat her for an uncomplicated UTI with Macrobid.    Final Clinical Impression(s) / ED Diagnoses Final diagnoses:  Acute cystitis without hematuria    Rx / DC Orders ED Discharge Orders          Ordered    nitrofurantoin, macrocrystal-monohydrate, (MACROBID) 100 MG capsule  2 times daily        05/25/22 1543    ondansetron (ZOFRAN) 4 MG tablet  Every 6 hours        05/25/22 1543    fluconazole (DIFLUCAN) 150 MG tablet  Daily        05/25/22 1543    lidocaine (LIDODERM) 5 %  Every 24 hours        05/25/22 1543    methocarbamol 1000 MG TABS  2 times daily        05/25/22 1543           Results and diagnoses were explained to the patient. Return precautions discussed in full. Patient had no additional questions and expressed complete understanding.   This chart was dictated using voice recognition software.  Despite best efforts to proofread,  errors can occur which can change the documentation meaning.    Darliss Ridgel 05/25/22 [place], [place], MD 05/26/22 [phone]

## 2022-05-25 NOTE — Discharge Instructions (Addendum)
I have sent the following medications to your pharmacy Lidocaine patch, remember you may not wear this for more than 12 hours in a row.  You may use up to 3 patches simultaneously Robaxin is a muscle relaxant.  Do not drive on this.  I have prescribed it for morning and night but if you have a busy day do not take it in the morning Macrobid is the antibiotic for your urinary tract infection.  Take this for the next week Fluconazole is for you to use as needed if you get a yeast infection after treatment. Ondansetron is the nausea medication for you to use if you become nauseated.  Your appendix looks okay and your pneumonia has resolved.  Also, you do have a small left-sided kidney stone that is not causing any symptoms but return if it begins to bother you down the line.  Follow-up with your primary care tomorrow as planned.  Return with any worsening symptoms.

## 2022-05-26 LAB — URINE CULTURE: Culture: <10000 — AB

## 2022-05-29 NOTE — Progress Notes (Unsigned)
Cardiology Office Note    Date:  05/30/2022   ID:  Kelli [name], DOB August 04, 1971, MRN [id number]  PCP:  Joya Gaskins, FNP  Cardiologist: Werner Lean, MD    Chief Complaint  Patient presents with   Hospitalization Follow-up    History of Present Illness:    Kelli [name] is a 51 y.o. female with past medical history of HLD, Type 2 DM, anemia and diverticulosis who presents to the office today for hospital follow-up.   She was admitted to [facility] in 04/2022 for severe sepsis in the setting of a UTI and PNA. Was also evaluated by GI for dysphagia during admission. Cardiology was consulted as she was found to have a reduced EF of 40-45%. She was started on Bisoprolol 2.5mg  daily, Lasix 40mg  daily and Spironolactone 12.5mg  daily. BP did not allow for ACE-I/ARB and she was not started on an SGLT2 inhibitor given her UTI. Was recommended to have a repeat TTE as an outpatient for reassessment of her EF.   Was evaluated again in the ED on 05/25/2022 for abdominal pain and low back pain. CT Abdomen showed esophageal thickening and a nonobstructive kidney stone but no acute findings. Was found to have a UTI and was discharged on antibiotic therapy.   In talking with the patient and her mom today, she denies any recent exertional chest pain or dyspnea on exertion. No recent orthopnea, PND or pitting edema. Her weight has continued to decline on her home scales. She is continuing to experience hip/lower back pain mostly along her right side and is scheduled to see her PCP tomorrow. Pain improves with positional changes.   Past Medical History:  Diagnosis Date   Allergy    Anemia    Arthritis    knee   Chronic headache    Colon polyps 03/28/11   Depression    Diabetes mellitus    Diverticulosis    GAD (generalized anxiety disorder)    Gallstones    GERD (gastroesophageal reflux disease)    Heart murmur    Hyperlipidemia    Migraines    Obesity     Past  Surgical History:  Procedure Laterality Date   BIOPSY  05/04/2022   Procedure: BIOPSY;  Surgeon: Daneil Dolin, MD;  Location: [facility];  Service: Endoscopy;;   [place] N/A 05/04/2022   Procedure: ESOPHAGEAL DILATION;  Surgeon: Daneil Dolin, MD;  Location: [facility];  Service: Endoscopy;  Laterality: N/A;   ESOPHAGOGASTRODUODENOSCOPY (EGD) WITH PROPOFOL N/A 05/04/2022   Procedure: ESOPHAGOGASTRODUODENOSCOPY (EGD) WITH PROPOFOL;  Surgeon: Daneil Dolin, MD;  Location: [facility];  Service: Endoscopy;  Laterality: N/A;   POLYPECTOMY     RHINOPLASTY  1993   TUBAL LIGATION Bilateral 1995    Current Medications: Outpatient Medications Prior to Visit  Medication Sig Dispense Refill   acetaminophen (TYLENOL) 325 MG tablet Take 650 mg by mouth as needed.     albuterol (VENTOLIN HFA) 108 (90 Base) MCG/ACT inhaler Inhale 1 puff into the lungs as needed.     bisoprolol (ZEBETA) 5 MG tablet Take 0.5 tablets (2.5 mg total) by mouth daily. 15 tablet 2   blood glucose meter kit and supplies Dispense based on patient and insurance preference. Use up to four times daily as directed. (FOR ICD-10 E10.9, E11.9). 1 each 0   buPROPion (WELLBUTRIN XL) 150 MG 24 hr  tablet Take 150 mg by mouth daily.     Dulaglutide 0.75 MG/0.5ML SOPN Inject into the skin.     escitalopram (LEXAPRO) 20 MG tablet Take 20 mg by mouth daily.     hydrOXYzine (ATARAX) 10 MG tablet Take 10-20 mg by mouth at bedtime as needed for anxiety (slleping).     Insulin Pen Needle 31G X 5 MM MISC 1 Device by Does not apply route as directed. 30 each 0   metFORMIN (GLUCOPHAGE-XR) 500 MG 24 hr tablet Take 2 tablets (1,000 mg total) by mouth 2 (two) times daily with a meal. 120 tablet 1   ondansetron (ZOFRAN) 4 MG tablet Take 1 tablet (4 mg total) by mouth every 6 (six) hours. 12 tablet 0   pantoprazole (PROTONIX) 40 MG tablet Take 1 tablet (40 mg  total) by mouth daily. 30 tablet 1   polyethylene glycol (MIRALAX / GLYCOLAX) 17 g packet Take 17 g by mouth daily as needed for mild constipation. 30 each 1   potassium chloride SA (KLOR-CON M) 10 MEQ tablet Take 1 tablet (10 mEq total) by mouth daily. 30 tablet 1   rosuvastatin (CRESTOR) 5 MG tablet Take 1 tablet (5 mg total) by mouth daily. 30 tablet 1   spironolactone (ALDACTONE) 25 MG tablet Take 0.5 tablets (12.5 mg total) by mouth daily. 15 tablet 1   furosemide (LASIX) 40 MG tablet Take 1 tablet (40 mg total) by mouth daily. 30 tablet 1   lidocaine (LIDODERM) 5 % Place 1 patch onto the skin daily. Remove & Discard patch within 12 hours or as directed by MD (Patient not taking: Reported on 05/30/2022) 30 patch 0   methocarbamol 1000 MG TABS Take 1,000 mg by mouth in the morning and at bedtime. (Patient not taking: Reported on 05/30/2022) 14 tablet 0   nitrofurantoin, macrocrystal-monohydrate, (MACROBID) 100 MG capsule Take 1 capsule (100 mg total) by mouth 2 (two) times daily. (Patient not taking: Reported on 05/30/2022) 10 capsule 0   insulin detemir (LEVEMIR) 100 UNIT/ML FlexPen Inject 15 Units into the skin daily. 15 mL 0   No facility-administered medications prior to visit.     Allergies:   Aspirin and Neosporin [neomycin-bacitracin zn-polymyx]   Social History   Socioeconomic History   Marital status: Married    Spouse name: Not on file   Number of children: 2   Years of education: Not on file   Highest education level: Not on file  Occupational History   Occupation: customer service rep  Tobacco Use   Smoking status: Former    Packs/day: 0.75    Years: 1.00    Total pack years: 0.75    Types: Cigarettes    Quit date: 04/24/2022    Years since quitting: 0.0   Smokeless tobacco: Never  Vaping Use   Vaping Use: Never used  Substance and Sexual Activity   Alcohol use: Not Currently    Comment: occasionally   Drug use: No   Sexual activity: Yes    Partners: Male     Birth control/protection: Surgical, Condom    Comment: BTL  Other Topics Concern   Not on file  Social History Narrative   Not on file   Social Determinants of Health   Financial Resource Strain: Not on file  Food Insecurity: Not on file  Transportation Needs: Not on file  Physical Activity: Not on file  Stress: Not on file  Social Connections: Not on file     Family History:  The  patient's family history includes Colon polyps in her mother; Diabetes in her maternal grandmother; Heart disease in her mother; Kidney failure in her daughter.   Review of Systems:    Please see the history of present illness.     All other systems reviewed and are otherwise negative except as noted above.   Physical Exam:    VS:  BP 122/78 (BP Location: Right Arm, Patient Position: Sitting, Cuff Size: Large)   Pulse 75   Ht 5\' 4"  (1.626 m)   Wt 190 lb (86.2 kg)   LMP 11/25/2019   SpO2 100%   BMI 32.61 kg/m    General: Well developed, well nourished,female appearing in no acute distress. Head: Normocephalic, atraumatic. Neck: No carotid bruits. JVD not elevated.  Lungs: Respirations regular and unlabored, without wheezes or rales.  Heart: Regular rate and rhythm. No S3 or S4.  No murmur, no rubs, or gallops appreciated. Abdomen: Appears non-distended. No obvious abdominal masses. Msk:  Strength and tone appear normal for age. No obvious joint deformities or effusions. Extremities: No clubbing or cyanosis. No pitting edema.  Distal pedal pulses are 2+ bilaterally. Neuro: Alert and oriented X 3. Moves all extremities spontaneously. No focal deficits noted. Psych:  Responds to questions appropriately with a normal affect. Skin: No rashes or lesions noted  Wt Readings from Last 3 Encounters:  05/30/22 190 lb (86.2 kg)  05/25/22 190 lb (86.2 kg)  05/04/22 206 lb (93.4 kg)     Studies/Labs Reviewed:   EKG:  EKG is not ordered today.   Recent Labs: 04/24/2022: TSH 1.486 04/29/2022: B  Natriuretic Peptide 113.0 05/03/2022: Magnesium 1.9 05/25/2022: ALT 14; BUN 11; Creatinine, Ser 0.63; Hemoglobin 14.1; Platelets 195; Potassium 4.0; Sodium 132   Lipid Panel    Component Value Date/Time   CHOL 143 09/17/2015 1342   TRIG 137 09/17/2015 1342   HDL 52 09/17/2015 1342   CHOLHDL 2.8 09/17/2015 1342   VLDL 27 09/17/2015 1342   LDLCALC 64 09/17/2015 1342    Additional studies/ records that were reviewed today include:   Echo: 04/27/2022 IMPRESSIONS     1. Left ventricular ejection fraction, by estimation, is 40 to 45%. The  left ventricle has mildly decreased function. The left ventricle  demonstrates global hypokinesis. The left ventricular internal cavity size  was mildly dilated.   2. Right ventricular systolic function is low normal. The right  ventricular size is normal. There is mildly elevated pulmonary artery  systolic pressure.   3. The mitral valve is normal in structure. Mild mitral valve  regurgitation.   4. The aortic valve is tricuspid. Aortic valve regurgitation is not  visualized. Aortic valve sclerosis is present, with no evidence of aortic  valve stenosis.   5. The inferior vena cava is dilated in size with <50% respiratory  variability, suggesting right atrial pressure of 15 mmHg.   Assessment:    1. Acute systolic CHF (congestive heart failure)/ new cardiomyopathy   2. Mixed hyperlipidemia   3. Lower abdominal pain      Plan:   In order of problems listed above:  1. HFmrEF - Recent echo showed her EF was reduced at 40-45% and this was in the setting of sepsis secondary to PNA and a UTI. She denies any recent orthopnea, PND or pitting edema and appears euvolemic on examination today. Weight has continued to decline on her home scales.  - Will reduce Lasix from 40mg  daily to 20mg  daily. Continue Bisoprolol 2.5mg  daily and Spironolactone 12.5mg   daily. She has not been on an ACE-I/ARB/ARNI given intermittent hypotension. Would not start an  SGLT2 inhibitor given her recurrent UTIs and yeast infections. Will plan for a repeat limited echo next month for reassessment of her EF. If this remains reduced, would try adding a low-dose ARB and would plan for a NST for ischemic evaluation.   2. HLD - Followed by her PCP. She remains on Crestor $RemoveBe'5mg'sYEmANIKV$  daily.   3. Abdominal Pain/Back Pain - Recent Abdominal CT was reassuring and she was treated for a UTI but reports persistent pain along her right hip area and right buttocks. Pain is worse with positional changes. May possibly be secondary to sciatica. She is scheduled to follow-up with her PCP tomorrow.     Medication Adjustments/Labs and Tests Ordered: Current medicines are reviewed at length with the patient today.  Concerns regarding medicines are outlined above.  Medication changes, Labs and Tests ordered today are listed in the Patient Instructions below. Patient Instructions  Medication Instructions:  Your physician has recommended you make the following change in your medication:  Decrease Lasix 20 mg tablets daily   Labwork: None  Testing/Procedures: Your physician has requested that you have an echocardiogram. Echocardiography is a painless test that uses sound waves to create images of your heart. It provides your doctor with information about the size and shape of your heart and how well your heart's chambers and valves are working. This procedure takes approximately one hour. There are no restrictions for this procedure.   Follow-Up: Follow up with Dr. Gasper Sells in 3 months.   Any Other Special Instructions Will Be Listed Below (If Applicable).     If you need a refill on your cardiac medications before your next appointment, please call your pharmacy.    Signed, Erma Heritage, PA-C  05/30/2022 7:25 PM    [facility] [address] [address] [place], [place] [address] Phone: [phone] Fax: [phone]

## 2022-05-30 ENCOUNTER — Ambulatory Visit (INDEPENDENT_AMBULATORY_CARE_PROVIDER_SITE_OTHER): Payer: PRIVATE HEALTH INSURANCE | Admitting: Student

## 2022-05-30 ENCOUNTER — Encounter: Payer: Self-pay | Admitting: Student

## 2022-05-30 VITALS — BP 122/78 | HR 75 | Ht 64.0 in | Wt 190.0 lb

## 2022-05-30 DIAGNOSIS — E782 Mixed hyperlipidemia: Secondary | ICD-10-CM

## 2022-05-30 DIAGNOSIS — R1030 Lower abdominal pain, unspecified: Secondary | ICD-10-CM

## 2022-05-30 DIAGNOSIS — I5021 Acute systolic (congestive) heart failure: Secondary | ICD-10-CM | POA: Diagnosis not present

## 2022-05-30 MED ORDER — FUROSEMIDE 20 MG PO TABS: 20.0000 mg | ORAL_TABLET | Freq: Every day | ORAL | 3 refills | Status: DC

## 2022-05-30 NOTE — Patient Instructions (Signed)
Medication Instructions:  Your physician has recommended you make the following change in your medication:  Decrease Lasix 20 mg tablets daily   Labwork: None  Testing/Procedures: Your physician has requested that you have an echocardiogram. Echocardiography is a painless test that uses sound waves to create images of your heart. It provides your doctor with information about the size and shape of your heart and how well your heart's chambers and valves are working. This procedure takes approximately one hour. There are no restrictions for this procedure.   Follow-Up: Follow up with Dr. Gasper Sells in 3 months.   Any Other Special Instructions Will Be Listed Below (If Applicable).     If you need a refill on your cardiac medications before your next appointment, please call your pharmacy.

## 2022-06-06 ENCOUNTER — Other Ambulatory Visit: Payer: Self-pay | Admitting: Student

## 2022-06-06 DIAGNOSIS — R9430 Abnormal result of cardiovascular function study, unspecified: Secondary | ICD-10-CM

## 2022-06-06 DIAGNOSIS — I5021 Acute systolic (congestive) heart failure: Secondary | ICD-10-CM

## 2022-06-21 ENCOUNTER — Other Ambulatory Visit: Payer: Self-pay | Admitting: Student

## 2022-06-26 ENCOUNTER — Ambulatory Visit: Payer: PRIVATE HEALTH INSURANCE

## 2022-06-26 DIAGNOSIS — I5021 Acute systolic (congestive) heart failure: Secondary | ICD-10-CM

## 2022-06-26 DIAGNOSIS — R9430 Abnormal result of cardiovascular function study, unspecified: Secondary | ICD-10-CM

## 2022-06-26 LAB — ECHOCARDIOGRAM LIMITED
Area-P 1/2: 3.3 cm2
Calc EF: 64 %
S' Lateral: 2.74 cm
Single Plane A2C EF: 66.2 %
Single Plane A4C EF: 60.5 %

## 2022-08-02 ENCOUNTER — Other Ambulatory Visit: Payer: Self-pay | Admitting: Student

## 2022-08-21 NOTE — Progress Notes (Deleted)
Cardiology Office Note:    Date:  08/21/2022   ID:  Kelli [name], DOB 1971-07-24, MRN [id number]  PCP:  Joya Gaskins, [facility] Cardiologist:  Werner Lean, MD { Click to update primary MD,subspecialty MD or APP then REFRESH:1}    Referring MD: Joya Gaskins, *   CC:  follow up HF  History of Present Illness:    Kelli [name] is a 51 y.o. female with a hx of HLD with T2DM found to have HF in the setting of sepsis.  Started on bisoprolol, MRA, and lasix.  BP limited further titration.  After infection, LVEF had improved (06/2022.  Patient notes that she is doing ***.   Since last visit notes *** . There are no*** interval hospital/ED visit.    No chest pain or pressure ***.  No SOB/DOE*** and no PND/Orthopnea***.  No weight gain or leg swelling***.  No palpitations or syncope ***.  Ambulatory blood pressure ***.   Past Medical History:  Diagnosis Date   Allergy    Anemia    Arthritis    knee   Chronic headache    Colon polyps 03/28/11   Depression    Diabetes mellitus    Diverticulosis    GAD (generalized anxiety disorder)    Gallstones    GERD (gastroesophageal reflux disease)    Heart murmur    Hyperlipidemia    Migraines    Obesity     Past Surgical History:  Procedure Laterality Date   BIOPSY  05/04/2022   Procedure: BIOPSY;  Surgeon: Daneil Dolin, MD;  [facility];  Service: Endoscopy;;   [place] N/A 05/04/2022   Procedure: ESOPHAGEAL DILATION;  Surgeon: Daneil Dolin, MD;  Location: [facility];  Service: Endoscopy;  Laterality: N/A;   ESOPHAGOGASTRODUODENOSCOPY (EGD) WITH PROPOFOL N/A 05/04/2022   Procedure: ESOPHAGOGASTRODUODENOSCOPY (EGD) WITH PROPOFOL;  Surgeon: Daneil Dolin, MD;  Location: [facility];  Service: Endoscopy;  Laterality: N/A;   POLYPECTOMY     RHINOPLASTY  1993   TUBAL  LIGATION Bilateral 1995    Current Medications: No outpatient medications have been marked as taking for the 08/22/22 encounter (Appointment) with Werner Lean, MD.     Allergies:   Aspirin and Neosporin [neomycin-bacitracin zn-polymyx]   Social History   Socioeconomic History   Marital status: Married    Spouse name: Not on file   Number of children: 2   Years of education: Not on file   Highest education level: Not on file  Occupational History   Occupation: customer service rep  Tobacco Use   Smoking status: Former    Packs/day: 0.75    Years: 1.00    Total pack years: 0.75    Types: Cigarettes    Quit date: 04/24/2022    Years since quitting: 0.3   Smokeless tobacco: Never  Vaping Use   Vaping Use: Never used  Substance and Sexual Activity   Alcohol use: Not Currently    Comment: occasionally   Drug use: No   Sexual activity: Yes    Partners: Male    Birth control/protection: Surgical, Condom    Comment: BTL  Other Topics Concern   Not on file  Social History Narrative   Not on file   Social Determinants of Health   Financial Resource Strain: Not on file  Food Insecurity:  Not on file  Transportation Needs: Not on file  Physical Activity: Not on file  Stress: Not on file  Social Connections: Not on file     Family History: The patient's family history includes Colon polyps in her mother; Diabetes in her maternal grandmother; Heart disease in her mother; Kidney failure in her daughter. There is no history of Colon cancer, Rectal cancer, Stomach cancer, or Esophageal cancer.  ROS:   Please see the history of present illness.     All other systems reviewed and are negative.  EKGs/Labs/Other Studies Reviewed:    The following studies were reviewed today:   EKG:  EKG is *** ordered today.  The ekg ordered today demonstrates *** 08/22/22: ***  ECHO COMPLETE WO IMAGING ENHANCING AGENT 04/27/2022  Narrative ECHOCARDIOGRAM REPORT    Patient  Name:   Kelli [name] Date of Exam: 04/27/2022 Medical Rec #:  [id number]        Height:       64.0 in Accession #:    [id number]       Weight:       232.1 lb Date of Birth:  July 02, 1971         BSA:          2.084 m Patient Age:    41 years         BP:           133/65 mmHg Patient Gender: F                HR:           86 bpm. Exam Location:  [facility]  Procedure: 2D Echo, Cardiac Doppler and Color Doppler  Indications:    CHF  History:        Patient has no prior history of Echocardiogram examinations. CHF; Risk Factors:Hypertension, Diabetes and Dyslipidemia.  Sonographer:    Wenda Low Referring Phys: [phone] DAVID TAT  IMPRESSIONS   1. Left ventricular ejection fraction, by estimation, is 40 to 45%. The left ventricle has mildly decreased function. The left ventricle demonstrates global hypokinesis. The left ventricular internal cavity size was mildly dilated. 2. Right ventricular systolic function is low normal. The right ventricular size is normal. There is mildly elevated pulmonary artery systolic pressure. 3. The mitral valve is normal in structure. Mild mitral valve regurgitation. 4. The aortic valve is tricuspid. Aortic valve regurgitation is not visualized. Aortic valve sclerosis is present, with no evidence of aortic valve stenosis. 5. The inferior vena cava is dilated in size with <50% respiratory variability, suggesting right atrial pressure of 15 mmHg.  FINDINGS Left Ventricle: Left ventricular ejection fraction, by estimation, is 40 to 45%. The left ventricle has mildly decreased function. The left ventricle demonstrates global hypokinesis. The left ventricular internal cavity size was mildly dilated. There is no left ventricular hypertrophy.  Right Ventricle: The right ventricular size is normal. Right vetricular wall thickness was not assessed. Right ventricular systolic function is low normal. There is mildly elevated pulmonary artery systolic pressure. The  tricuspid regurgitant velocity is 2.42 m/s, and with an assumed right atrial pressure of 15 mmHg, the estimated right ventricular systolic pressure is 54.0 mmHg.  Left Atrium: Left atrial size was normal in size.  Right Atrium: Right atrial size was normal in size.  Pericardium: There is no evidence of pericardial effusion.  Mitral Valve: The mitral valve is normal in structure. Mild mitral valve regurgitation. MV peak gradient, 3.8 mmHg. The mean mitral valve gradient is  2.0 mmHg.  Tricuspid Valve: The tricuspid valve is normal in structure. Tricuspid valve regurgitation is mild.  Aortic Valve: The aortic valve is tricuspid. Aortic valve regurgitation is not visualized. Aortic valve sclerosis is present, with no evidence of aortic valve stenosis. Aortic valve mean gradient measures 4.0 mmHg. Aortic valve peak gradient measures 6.9 mmHg. Aortic valve area, by VTI measures 2.16 cm.  Pulmonic Valve: The pulmonic valve was grossly normal. Pulmonic valve regurgitation is not visualized.  Aorta: The aortic root is normal in size and structure.  Venous: The inferior vena cava is dilated in size with less than 50% respiratory variability, suggesting right atrial pressure of 15 mmHg.  IAS/Shunts: No atrial level shunt detected by color flow Doppler.   LEFT VENTRICLE PLAX 2D LVIDd:         5.20 cm     Diastology LVIDs:         3.65 cm     LV e' medial:    7.94 cm/s LV PW:         0.80 cm     LV E/e' medial:  8.7 LV IVS:        1.00 cm     LV e' lateral:   14.90 cm/s LVOT diam:     2.00 cm     LV E/e' lateral: 4.6 LV SV:         57 LV SV Index:   27 LVOT Area:     3.14 cm  LV Volumes (MOD) LV vol d, MOD A2C: 94.9 ml LV vol d, MOD A4C: 81.3 ml LV vol s, MOD A2C: 48.9 ml LV vol s, MOD A4C: 42.9 ml LV SV MOD A2C:     46.0 ml LV SV MOD A4C:     81.3 ml LV SV MOD BP:      42.0 ml  RIGHT VENTRICLE RV Basal diam:  3.55 cm RV Mid diam:    3.70 cm RV S prime:     12.90 cm/s TAPSE  (M-mode): 2.4 cm  LEFT ATRIUM             Index        RIGHT ATRIUM           Index LA diam:        4.30 cm 2.06 cm/m   RA Area:     15.70 cm LA Vol (A2C):   53.4 ml 25.62 ml/m  RA Volume:   44.80 ml  21.49 ml/m LA Vol (A4C):   55.1 ml 26.44 ml/m LA Biplane Vol: 58.7 ml 28.16 ml/m AORTIC VALVE                    PULMONIC VALVE AV Area (Vmax):    2.27 cm     PV Vmax:       0.66 m/s AV Area (Vmean):   1.99 cm     PV Peak grad:  1.7 mmHg AV Area (VTI):     2.16 cm AV Vmax:           131.00 cm/s AV Vmean:          90.500 cm/s AV VTI:            0.262 m AV Peak Grad:      6.9 mmHg AV Mean Grad:      4.0 mmHg LVOT Vmax:         94.60 cm/s LVOT Vmean:        57.300 cm/s LVOT VTI:  0.180 m LVOT/AV VTI ratio: 0.69  AORTA Ao Root diam: 2.70 cm  MITRAL VALVE               TRICUSPID VALVE MV Area (PHT): 4.17 cm    TR Peak grad:   23.4 mmHg MV Area VTI:   2.46 cm    TR Vmax:        242.00 cm/s MV Peak grad:  3.8 mmHg MV Mean grad:  2.0 mmHg    SHUNTS MV Vmax:       0.98 m/s    Systemic VTI:  0.18 m MV Vmean:      61.6 cm/s   Systemic Diam: 2.00 cm MV Decel Time: 182 msec MV E velocity: 69.00 cm/s MV A velocity: 77.60 cm/s MV E/A ratio:  0.89  Dorris Carnes MD Electronically signed by Dorris Carnes MD Signature Date/Time: 04/27/2022/10:03:09 PM    Final   ECHO LIMITED WO IMAGE ENHANCING AGENT 06/26/2022  Narrative ECHOCARDIOGRAM LIMITED REPORT    Patient Name:   Kelli [name] Date of Exam: 06/26/2022 Medical Rec #:  [id number]        Height:       64.0 in Accession #:    [id number]       Weight:       190.0 lb Date of Birth:  April 04, 1971         BSA:          1.914 m Patient Age:    21 years         BP:           122/78 mmHg Patient Gender: F                HR:           70 bpm. Exam Location:  [facility]  Procedure: Limited Echo, Cardiac Doppler, Color Doppler and Strain Analysis  Indications:    M57.84 Acute systolic (congestive) heart failure R94.30  EF<50%  History:        Patient has prior history of Echocardiogram examinations, most recent 04/27/2022. CHF; Risk Factors:Hypertension, Diabetes, Dyslipidemia and Former Smoker.  Sonographer:    Jeneen Montgomery RDMS, RVT, RDCS Referring Phys: [id number] [place]   1. Limited study. 2. Left ventricular ejection fraction, by estimation, is 55 to 60%. The left ventricle has normal function. The left ventricle has no regional wall motion abnormalities. Left ventricular diastolic parameters were normal. The average left ventricular global longitudinal strain is -21.9 %. The global longitudinal strain is normal. 3. Right ventricular systolic function is normal. The right ventricular size is normal. 4. The aortic valve is tricuspid.  Comparison(s): Prior images reviewed side by side. LVEF has improved to normal range, 55-60%.  FINDINGS Left Ventricle: Left ventricular ejection fraction, by estimation, is 55 to 60%. The left ventricle has normal function. The left ventricle has no regional wall motion abnormalities. The average left ventricular global longitudinal strain is -21.9 %. The global longitudinal strain is normal. The left ventricular internal cavity size was normal in size. There is no left ventricular hypertrophy. Left ventricular diastolic parameters were normal.  Right Ventricle: The right ventricular size is normal. No increase in right ventricular wall thickness. Right ventricular systolic function is normal.  Left Atrium: Left atrial size was normal in size.  Right Atrium: Right atrial size was normal in size.  Pericardium: There is no evidence of pericardial effusion.  Aortic Valve: The aortic valve is tricuspid.  Aorta: The aortic root  is normal in size and structure.  LEFT VENTRICLE PLAX 2D LVIDd:         5.09 cm     Diastology LVIDs:         2.74 cm     LV e' medial:    9.03 cm/s LV PW:         0.88 cm     LV E/e' medial:  8.4 LV IVS:         0.72 cm     LV e' lateral:   14.10 cm/s LV E/e' lateral: 5.4  LV Volumes (MOD)           2D Longitudinal Strain LV vol d, MOD A2C: 74.3 ml 2D Strain GLS (A2C):   -21.7 % LV vol d, MOD A4C: 86.9 ml 2D Strain GLS (A3C):   -21.1 % LV vol s, MOD A2C: 25.1 ml 2D Strain GLS (A4C):   -23.0 % LV vol s, MOD A4C: 34.3 ml 2D Strain GLS Avg:     -21.9 % LV SV MOD A2C:     49.2 ml LV SV MOD A4C:     86.9 ml LV SV MOD BP:      51.7 ml  RIGHT VENTRICLE RV S prime:     8.49 cm/s TAPSE (M-mode): 2.4 cm  LEFT ATRIUM             Index        RIGHT ATRIUM           Index LA diam:        3.50 cm 1.83 cm/m   RA Area:     12.80 cm LA Vol (A2C):   45.3 ml 23.67 ml/m  RA Volume:   30.20 ml  15.78 ml/m LA Vol (A4C):   37.6 ml 19.64 ml/m LA Biplane Vol: 41.2 ml 21.52 ml/m  AORTA Ao Root diam: 2.50 cm  MITRAL VALVE MV Area (PHT): 3.30 cm MV Decel Time: 230 msec MV E velocity: 76.30 cm/s MV A velocity: 78.40 cm/s MV E/A ratio:  0.97  Rozann Lesches MD Electronically signed by Rozann Lesches MD Signature Date/Time: 06/26/2022/1:48:37 PM    Final     Recent Labs: 04/24/2022: TSH 1.486 04/29/2022: B Natriuretic Peptide 113.0 05/03/2022: Magnesium 1.9 05/25/2022: ALT 14; BUN 11; Creatinine, Ser 0.63; Hemoglobin 14.1; Platelets 195; Potassium 4.0; Sodium 132  Recent Lipid Panel    Component Value Date/Time   CHOL 143 09/17/2015 1342   TRIG 137 09/17/2015 1342   HDL 52 09/17/2015 1342   CHOLHDL 2.8 09/17/2015 1342   VLDL 27 09/17/2015 1342   LDLCALC 64 09/17/2015 1342     Physical Exam:    VS:  LMP 11/25/2019     Wt Readings from Last 3 Encounters:  05/30/22 190 lb (86.2 kg)  05/25/22 190 lb (86.2 kg)  05/04/22 206 lb (93.4 kg)    Gen: *** distress, *** obese/well nourished/malnourished   Neck: No JVD, *** carotid bruit Ears: *** Frank Sign Cardiac: No Rubs or Gallops, *** Murmur, ***cardia, *** radial pulses Respiratory: Clear to auscultation bilaterally, *** effort, ***   respiratory rate GI: Soft, nontender, non-distended *** MS: No *** edema; *** moves all extremities Integument: Skin feels *** Neuro:  At time of evaluation, alert and oriented to person/place/time/situation *** Psych: Normal affect, patient feels ***   ASSESSMENT:    No diagnosis found. PLAN:    Heart Failure with recovered Ejection Fraction      {Are you ordering a CV Procedure (  e.g. stress test, cath, DCCV, TEE, etc)?   Press F2        :[id number]}    Medication Adjustments/Labs and Tests Ordered: Current medicines are reviewed at length with the patient today.  Concerns regarding medicines are outlined above.  No orders of the defined types were placed in this encounter.  No orders of the defined types were placed in this encounter.   There are no Patient Instructions on file for this visit.   Signed, Werner Lean, MD  08/21/2022 9:44 AM    [place]

## 2022-08-22 ENCOUNTER — Ambulatory Visit: Payer: PRIVATE HEALTH INSURANCE | Admitting: Internal Medicine

## 2022-09-11 ENCOUNTER — Other Ambulatory Visit: Payer: Self-pay | Admitting: Student

## 2022-09-25 ENCOUNTER — Other Ambulatory Visit: Payer: Self-pay

## 2022-09-25 ENCOUNTER — Encounter (HOSPITAL_COMMUNITY): Payer: Self-pay

## 2022-09-25 ENCOUNTER — Emergency Department (HOSPITAL_COMMUNITY): Payer: PRIVATE HEALTH INSURANCE

## 2022-09-25 ENCOUNTER — Emergency Department (HOSPITAL_COMMUNITY)
Admission: EM | Admit: 2022-09-25 | Discharge: 2022-09-25 | Disposition: A | Discharge status: Home / Self Care | Payer: PRIVATE HEALTH INSURANCE | Attending: Emergency Medicine | Admitting: Emergency Medicine

## 2022-09-25 DIAGNOSIS — R42 Dizziness and giddiness: Secondary | ICD-10-CM

## 2022-09-25 DIAGNOSIS — Z79899 Other long term (current) drug therapy: Secondary | ICD-10-CM | POA: Diagnosis not present

## 2022-09-25 DIAGNOSIS — I110 Hypertensive heart disease with heart failure: Secondary | ICD-10-CM | POA: Insufficient documentation

## 2022-09-25 DIAGNOSIS — E119 Type 2 diabetes mellitus without complications: Secondary | ICD-10-CM | POA: Insufficient documentation

## 2022-09-25 DIAGNOSIS — E860 Dehydration: Secondary | ICD-10-CM

## 2022-09-25 DIAGNOSIS — M25562 Pain in left knee: Secondary | ICD-10-CM | POA: Diagnosis not present

## 2022-09-25 DIAGNOSIS — I509 Heart failure, unspecified: Secondary | ICD-10-CM | POA: Insufficient documentation

## 2022-09-25 DIAGNOSIS — Z794 Long term (current) use of insulin: Secondary | ICD-10-CM | POA: Insufficient documentation

## 2022-09-25 DIAGNOSIS — W1839XA Other fall on same level, initial encounter: Secondary | ICD-10-CM | POA: Diagnosis not present

## 2022-09-25 DIAGNOSIS — M25561 Pain in right knee: Secondary | ICD-10-CM | POA: Diagnosis not present

## 2022-09-25 DIAGNOSIS — Z7984 Long term (current) use of oral hypoglycemic drugs: Secondary | ICD-10-CM | POA: Insufficient documentation

## 2022-09-25 DIAGNOSIS — W19XXXA Unspecified fall, initial encounter: Secondary | ICD-10-CM

## 2022-09-25 LAB — URINALYSIS, ROUTINE W REFLEX MICROSCOPIC
Bilirubin Urine: NEGATIVE
Glucose, UA: 50 mg/dL — AB
Hgb urine dipstick: NEGATIVE
Ketones, ur: NEGATIVE mg/dL
Nitrite: NEGATIVE
Protein, ur: 100 mg/dL — AB
Specific Gravity, Urine: 1.033 — ABNORMAL HIGH (ref 1.005–1.030)
pH: 5 (ref 5.0–8.0)

## 2022-09-25 LAB — CBC
HCT: 39.5 % (ref 36.0–46.0)
Hemoglobin: 13.4 g/dL (ref 12.0–15.0)
MCH: 33.3 pg (ref 26.0–34.0)
MCHC: 33.9 g/dL (ref 30.0–36.0)
MCV: 98.3 fL (ref 80.0–100.0)
Platelets: 142 10*3/uL — ABNORMAL LOW (ref 150–400)
RBC: 4.02 MIL/uL (ref 3.87–5.11)
RDW: 12.9 % (ref 11.5–15.5)
WBC: 9.5 10*3/uL (ref 4.0–10.5)
nRBC: 0 % (ref 0.0–0.2)

## 2022-09-25 LAB — BASIC METABOLIC PANEL
Anion gap: 10 (ref 5–15)
BUN: 18 mg/dL (ref 6–20)
CO2: 25 mmol/L (ref 22–32)
Calcium: 9.3 mg/dL (ref 8.9–10.3)
Chloride: 102 mmol/L (ref 98–111)
Creatinine, Ser: 0.58 mg/dL (ref 0.44–1.00)
GFR, Estimated: >60 mL/min (ref >60)
Glucose, Bld: 200 mg/dL — ABNORMAL HIGH (ref 70–99)
Potassium: 3.8 mmol/L (ref 3.5–5.1)
Sodium: 137 mmol/L (ref 135–145)

## 2022-09-25 LAB — GLUCOSE, CAPILLARY: Glucose-Capillary: 210 mg/dL — ABNORMAL HIGH (ref 70–99)

## 2022-09-25 MED ORDER — SODIUM CHLORIDE 0.9 % IV BOLUS
500.0000 mL | Freq: Once | INTRAVENOUS | Status: AC
  Administered 2022-09-25: 500 mL via INTRAVENOUS

## 2022-09-25 MED ORDER — MORPHINE SULFATE (PF) 4 MG/ML IV SOLN
2.0000 mg | Freq: Once | INTRAVENOUS | Status: AC
  Administered 2022-09-25: 2 mg via INTRAVENOUS
  Filled 2022-09-25: qty 1

## 2022-09-25 NOTE — ED Notes (Signed)
Knee brace applied to R knee. Pt signed ortho papers.

## 2022-09-25 NOTE — Discharge Instructions (Signed)
As we discussed, your work-up in the ER today was reassuring for acute findings.  I suspect that your symptoms are related to dehydration given that fluids improved her symptoms.  I recommend that you ensure that you are maintaining adequate hydration daily.  I also recommend that you discuss with your doctor if you still require daily Lasix.  You will need to follow-up with both your primary care doctor and your cardiologist for continued evaluation and management of your symptoms.  It is also important that you transition from sitting to standing slowly to help prevent your symptoms.   Additionally, have given you a knee brace to help with your pain as well as a follow-up with an orthopedist as needed if you continue to have pain.  In the interim, I recommend rest, ice, compression, and elevation and Tylenol as needed for pain.  Return if development of any new or worsening symptoms.

## 2022-09-25 NOTE — ED Triage Notes (Signed)
Pt presents to ED after fall last night. Pt states she was dizzy and fell to her knees. Pt c/o bilateral knee pain. Denies dizziness at this time.

## 2022-09-25 NOTE — ED Provider Notes (Signed)
[facility] EMERGENCY DEPARTMENT Provider Note   CSN: [id number] Arrival date & time: 09/25/22  1757     History  Chief Complaint  Patient presents with   Lytle Michaels    Kelli [name] is a 51 y.o. female.  Patient with history of T2DM, CHF (echo 55-60% 7/23), htn, IBS, and anemia presents today with complaints of fall.  She states that same occurred yesterday evening when she stood up from the dinner table to walk into the kitchen and felt lightheaded as if she would pass out and fell on her knees. Endorses pain to bilateral knees since her fall. She has been able to walk since without assistance per baseline. She denies losing consciousness.  She did not hit her head.  She is not anticoagulated.  States that same has been occurring over the past few days when she stands up too quickly.  States that she has never actually lost consciousness. She denies any fevers, chills, chest pain, shortness of breath, nausea, or vomiting. States that she is on Lasix 20 mg daily for her heart failure and that her weights have been good recently. Of note, patient is post menopausal and has not had a menstrual cycle in many years and is not sexually active.  The history is provided by the patient. No language interpreter was used.  Fall       Home Medications Prior to Admission medications   Medication Sig Start Date End Date Taking? Authorizing Provider  acetaminophen (TYLENOL) 325 MG tablet Take 650 mg by mouth as needed.    [provider]  albuterol (VENTOLIN HFA) 108 (90 Base) MCG/ACT inhaler Inhale 1 puff into the lungs as needed. 01/30/20   [provider]  bisoprolol (ZEBETA) 5 MG tablet TAKE 1/2 TABLET BY MOUTH DAILY 09/11/22   Ahmed Prima, [place] M, PA-C  blood glucose meter kit and supplies Dispense based on patient and insurance preference. Use up to four times daily as directed. (FOR ICD-10 E10.9, E11.9). 05/04/22   Johnson, Clanford L, MD  buPROPion (WELLBUTRIN XL) 150 MG 24  hr tablet Take 150 mg by mouth daily. 04/06/22   [provider]  Dulaglutide 0.75 MG/0.5ML SOPN Inject into the skin. 05/26/22   [provider]  escitalopram (LEXAPRO) 20 MG tablet Take 20 mg by mouth daily. 03/23/22   [provider]  furosemide (LASIX) 20 MG tablet Take 1 tablet (20 mg total) by mouth daily. 05/30/22 05/25/23  Strader, Fransisco Hertz, PA-C  hydrOXYzine (ATARAX) 10 MG tablet Take 10-20 mg by mouth at bedtime as needed for anxiety (slleping). 04/06/22   [provider]  Insulin Pen Needle 31G X 5 MM MISC 1 Device by Does not apply route as directed. 05/04/22   Johnson, Clanford L, MD  lidocaine (LIDODERM) 5 % Place 1 patch onto the skin daily. Remove & Discard patch within 12 hours or as directed by MD Patient not taking: Reported on 05/30/2022 05/25/22   Redwine, Madison A, PA-C  metFORMIN (GLUCOPHAGE-XR) 500 MG 24 hr tablet Take 2 tablets (1,000 mg total) by mouth 2 (two) times daily with a meal. 05/04/22   Johnson, Clanford L, MD  methocarbamol 1000 MG TABS Take 1,000 mg by mouth in the morning and at bedtime. Patient not taking: Reported on 05/30/2022 05/25/22   Redwine, Madison A, PA-C  nitrofurantoin, macrocrystal-monohydrate, (MACROBID) 100 MG capsule Take 1 capsule (100 mg total) by mouth 2 (two) times daily. Patient not taking: Reported on 05/30/2022 05/25/22   Redwine, Madison A, PA-C  ondansetron (ZOFRAN) 4 MG tablet Take 1 tablet (4 mg total) by mouth every 6 (six) hours. 05/25/22   Redwine, Madison A, PA-C  pantoprazole (PROTONIX) 40 MG tablet TAKE 1 TABLET BY MOUTH DAILY 08/02/22   Ahmed Prima, [place] M, PA-C  polyethylene glycol (MIRALAX / GLYCOLAX) 17 g packet Take 17 g by mouth daily as needed for mild constipation. 05/04/22   Johnson, Clanford L, MD  potassium chloride (KLOR-CON M) 10 MEQ tablet TAKE 1 TABLET BY MOUTH DAILY 08/02/22   Ahmed Prima, [place] M, PA-C  rosuvastatin (CRESTOR) 5 MG tablet TAKE 1 TABLET BY MOUTH DAILY 08/02/22   Ahmed Prima, [place]  M, PA-C  spironolactone (ALDACTONE) 25 MG tablet TAKE 1/2 TABLET BY MOUTH DAILY 08/02/22   Ahmed Prima, [place] M, PA-C      Allergies    Aspirin and Neosporin [neomycin-bacitracin zn-polymyx]    Review of Systems   Review of Systems  Musculoskeletal:  Positive for arthralgias.  Neurological:  Positive for light-headedness.  All other systems reviewed and are negative.   Physical Exam Updated Vital Signs BP 99/67   Pulse 76   Resp 14   Ht $R'5\' 4"'Hd$  (1.626 m)   Wt 80.3 kg   LMP 11/25/2019   SpO2 100%   BMI 30.38 kg/m  Physical Exam Vitals and nursing note reviewed.  Constitutional:      General: She is not in acute distress.    Appearance: Normal appearance. She is normal weight. She is not ill-appearing, toxic-appearing or diaphoretic.  HENT:     Head: Normocephalic and atraumatic.  Eyes:     Extraocular Movements: Extraocular movements intact.     Pupils: Pupils are equal, round, and reactive to light.  Cardiovascular:     Rate and Rhythm: Normal rate and regular rhythm.     Pulses: Normal pulses.     Heart sounds: Normal heart sounds.  Pulmonary:     Effort: Pulmonary effort is normal. No respiratory distress.     Breath sounds: Normal breath sounds.  Abdominal:     General: Abdomen is flat.     Palpations: Abdomen is soft.  Musculoskeletal:        General: Normal range of motion.     Cervical back: Normal range of motion.     Right lower leg: No edema.     Left lower leg: No edema.  Skin:    General: Skin is warm and dry.     Comments: No lower extremity edema or tenderness present  Neurological:     General: No focal deficit present.     Mental Status: She is alert and oriented to person, place, and time.  Psychiatric:        Mood and Affect: Mood normal.        Behavior: Behavior normal.     ED Results / Procedures / Treatments   Labs (all labs ordered are listed, but only abnormal results are displayed) Labs Reviewed  GLUCOSE, CAPILLARY - Abnormal;  Notable for the following components:      Result Value   Glucose-Capillary 210 (*)    All other components within normal limits  BASIC METABOLIC PANEL  CBC  URINALYSIS, ROUTINE W REFLEX MICROSCOPIC  CBG MONITORING, ED    EKG None  Radiology DG Knee Complete 4 Views Right  Result Date: 09/25/2022 CLINICAL DATA:  Bilateral knee pain following a fall last night. EXAM: RIGHT KNEE - COMPLETE 4+ VIEW COMPARISON:  None Available. FINDINGS: Mild medial and lateral spur formation and mild-to-moderate posterior  patellar spur formation. Mild medial joint space narrowing. Probable small effusion. No fracture or dislocation seen. IMPRESSION: 1. No fracture. 2. Probable small effusion. 3. Mild tricompartmental degenerative changes. Electronically Signed   By: Claudie Revering M.D.   On: 09/25/2022 18:56   DG Knee Complete 4 Views Left  Result Date: 09/25/2022 CLINICAL DATA:  Bilateral knee pain following a fall last night. EXAM: LEFT KNEE - COMPLETE 4+ VIEW COMPARISON:  12/08/2014 FINDINGS: Mild medial joint space narrowing. Mild-to-moderate medial and mild lateral and patellofemoral spur formation. Small effusion. No fracture or dislocation seen. IMPRESSION: 1. No fracture. 2. Small effusion. 3. Mild tricompartmental degenerative changes without significant change. Electronically Signed   By: Claudie Revering M.D.   On: 09/25/2022 18:54    Procedures Procedures    Medications Ordered in ED Medications  sodium chloride 0.9 % bolus 500 mL (has no administration in time range)    ED Course/ Medical Decision Making/ A&P                           Medical Decision Making Amount and/or Complexity of Data Reviewed Labs: ordered. Radiology: ordered.   This patient presents to the ED for concern of pre syncope and knee pain, this involves an extensive number of treatment options, and is a complaint that carries with it a high risk of complications and morbidity.   Co morbidities that complicate the  patient evaluation  Hx CHF   Additional history obtained:  Additional history obtained from epic chart review   Lab Tests:  I Ordered, and personally interpreted labs.  The pertinent results include:  no leukocytosis or anemia. Glucose 200. UA low suspicion for infection in absence of symptoms   Imaging Studies ordered:  I ordered imaging studies including DG bilateral knees  I independently visualized and interpreted imaging which showed  No acute findings I agree with the radiologist interpretation   Cardiac Monitoring: / EKG:  The patient was maintained on a cardiac monitor.  I personally viewed and interpreted the cardiac monitored which showed an underlying rhythm of: no STEMI   Problem List / ED Course / Critical interventions / Medication management  I ordered medication including fluids  for dehydration  Reevaluation of the patient after these medicines showed that the patient resolved I have reviewed the patients home medicines and have made adjustments as needed  Test / Admission - Considered:  Patient presents today with bilateral knee pain from a fall yesterday.  She states that she stood up from the dinner table and got lightheaded and subsequently fell to her knees.  She denies any loss of consciousness.  States that this is happened several times over the past few days every time when she stands up quickly. Sitting in the bed, patient denies any symptoms, however when she stands she states that she feels lightheaded. She is afebrile, nontoxic-appearing, and in no acute distress with reassuring vital signs.  She is mildly hypotensive in the 90s. Orthostatic vital signs performed and are reassuring.  Will give fluids and reassess. Considered pregnancy test, however patient states she is postmenopausal, has not had a menstrual cycle in years, and is not sexually active.  Therefore will defer testing at this time. After fluids, upon reassessment, patient is able to  stand in the room without any subsequent lightheadedness. States that she is completely asymptomatic. Her blood pressure is now improved to 120/74. She is not tachycardic or hypoxic and denies any chest  pain or shortness of breath. She is also low risk [place]. Given her improvement with fluids, low suspicion for PE or cardiac cause of symptoms. Discussed this with patient who is in agreement. She is on Lasix daily and has been doing well with her fluid status and had improvement on her echo in July, I wonder if she needs to decrease her Lasix dose. Will defer to her cardiologist for same. For now will emphasize hydration and transitioning to standing slowly. She is also diabetic, sugars have been good today, low suspicion that this is causing her symptoms but recommend she check her sugars more regularly in the future especially if this happens again. Patient is understanding and amenable with plan.  I have also given her a knee brace given her pain from her fall.  Will include orthopedic follow-up as needed if her symptoms not improve.  Recommend RICE as well.  She is stable for discharge at this time, educated on red flag symptoms that would prompt immediate return.  Also emphasized the importance of close PCP and cardiology follow-up.  Patient discharged in stable condition.   Final Clinical Impression(s) / ED Diagnoses Final diagnoses:  Fall, initial encounter  Dehydration  Lightheadedness  Acute pain of both knees    Rx / DC Orders ED Discharge Orders     None     An After Visit Summary was printed and given to the patient.     Nestor Lewandowsky 09/25/22 2221    Davonna Belling, MD 09/25/22 [phone]

## 2022-10-01 ENCOUNTER — Other Ambulatory Visit: Payer: Self-pay | Admitting: Student

## 2022-10-08 ENCOUNTER — Encounter (HOSPITAL_COMMUNITY): Payer: Self-pay | Admitting: Emergency Medicine

## 2022-10-08 ENCOUNTER — Emergency Department (HOSPITAL_COMMUNITY)
Admission: EM | Admit: 2022-10-08 | Discharge: 2022-10-08 | Discharge status: Against Medical Advice | Payer: PRIVATE HEALTH INSURANCE | Attending: Emergency Medicine | Admitting: Emergency Medicine

## 2022-10-08 ENCOUNTER — Other Ambulatory Visit: Payer: Self-pay

## 2022-10-08 DIAGNOSIS — R109 Unspecified abdominal pain: Secondary | ICD-10-CM | POA: Diagnosis present

## 2022-10-08 DIAGNOSIS — Z5321 Procedure and treatment not carried out due to patient leaving prior to being seen by health care provider: Secondary | ICD-10-CM | POA: Insufficient documentation

## 2022-10-08 NOTE — ED Triage Notes (Signed)
Patient c/o abdominal pain around midsection and right side since May.  Patient has had US/CT scan and is supposed to have [place] but it's not scheduled until November.  Patient reports that she is out of her tramadol that has been helping.  Patient reports "I want to find out what is wrong."

## 2022-10-30 ENCOUNTER — Other Ambulatory Visit: Payer: Self-pay | Admitting: Student

## 2023-02-22 ENCOUNTER — Ambulatory Visit: Payer: Medicaid Other | Admitting: Neurology

## 2023-02-22 ENCOUNTER — Encounter: Payer: Self-pay | Admitting: Neurology

## 2023-02-27 ENCOUNTER — Ambulatory Visit (HOSPITAL_COMMUNITY): Payer: Medicaid Other

## 2023-03-13 ENCOUNTER — Encounter (HOSPITAL_COMMUNITY): Payer: Self-pay

## 2023-03-13 ENCOUNTER — Other Ambulatory Visit: Payer: Self-pay

## 2023-03-13 ENCOUNTER — Emergency Department (HOSPITAL_COMMUNITY): Payer: Medicaid Other

## 2023-03-13 ENCOUNTER — Emergency Department (HOSPITAL_COMMUNITY)
Admission: EM | Admit: 2023-03-13 | Discharge: 2023-03-13 | Disposition: A | Discharge status: Home / Self Care | Payer: Medicaid Other | Attending: Emergency Medicine | Admitting: Emergency Medicine

## 2023-03-13 DIAGNOSIS — Z7984 Long term (current) use of oral hypoglycemic drugs: Secondary | ICD-10-CM | POA: Insufficient documentation

## 2023-03-13 DIAGNOSIS — R42 Dizziness and giddiness: Secondary | ICD-10-CM | POA: Diagnosis not present

## 2023-03-13 DIAGNOSIS — I110 Hypertensive heart disease with heart failure: Secondary | ICD-10-CM | POA: Diagnosis not present

## 2023-03-13 DIAGNOSIS — I509 Heart failure, unspecified: Secondary | ICD-10-CM | POA: Insufficient documentation

## 2023-03-13 DIAGNOSIS — R002 Palpitations: Secondary | ICD-10-CM | POA: Insufficient documentation

## 2023-03-13 DIAGNOSIS — R519 Headache, unspecified: Secondary | ICD-10-CM | POA: Diagnosis not present

## 2023-03-13 DIAGNOSIS — E119 Type 2 diabetes mellitus without complications: Secondary | ICD-10-CM | POA: Diagnosis not present

## 2023-03-13 DIAGNOSIS — R112 Nausea with vomiting, unspecified: Secondary | ICD-10-CM | POA: Insufficient documentation

## 2023-03-13 DIAGNOSIS — R0602 Shortness of breath: Secondary | ICD-10-CM | POA: Diagnosis not present

## 2023-03-13 LAB — URINALYSIS, ROUTINE W REFLEX MICROSCOPIC
Bilirubin Urine: NEGATIVE
Glucose, UA: NEGATIVE mg/dL
Hgb urine dipstick: NEGATIVE
Ketones, ur: 20 mg/dL — AB
Leukocytes,Ua: NEGATIVE
Nitrite: NEGATIVE
Protein, ur: 30 mg/dL — AB
Specific Gravity, Urine: 1.019 (ref 1.005–1.030)
pH: 5 (ref 5.0–8.0)

## 2023-03-13 LAB — COMPREHENSIVE METABOLIC PANEL
ALT: 10 U/L (ref 0–44)
AST: 9 U/L — ABNORMAL LOW (ref 15–41)
Albumin: 4.3 g/dL (ref 3.5–5.0)
Alkaline Phosphatase: 86 U/L (ref 38–126)
Anion gap: 17 — ABNORMAL HIGH (ref 5–15)
BUN: 19 mg/dL (ref 6–20)
CO2: 26 mmol/L (ref 22–32)
Calcium: 9.6 mg/dL (ref 8.9–10.3)
Chloride: 91 mmol/L — ABNORMAL LOW (ref 98–111)
Creatinine, Ser: 0.94 mg/dL (ref 0.44–1.00)
GFR, Estimated: >60 mL/min (ref >60)
Glucose, Bld: 241 mg/dL — ABNORMAL HIGH (ref 70–99)
Potassium: 3 mmol/L — ABNORMAL LOW (ref 3.5–5.1)
Sodium: 134 mmol/L — ABNORMAL LOW (ref 135–145)
Total Bilirubin: 1.5 mg/dL — ABNORMAL HIGH (ref 0.3–1.2)
Total Protein: 7.8 g/dL (ref 6.5–8.1)

## 2023-03-13 LAB — CBC WITH DIFFERENTIAL/PLATELET
Abs Immature Granulocytes: 0.04 10*3/uL (ref 0.00–0.07)
Basophils Absolute: 0 10*3/uL (ref 0.0–0.1)
Basophils Relative: 0 %
Eosinophils Absolute: 0.1 10*3/uL (ref 0.0–0.5)
Eosinophils Relative: 0 %
HCT: 46.1 % — ABNORMAL HIGH (ref 36.0–46.0)
Hemoglobin: 16.2 g/dL — ABNORMAL HIGH (ref 12.0–15.0)
Immature Granulocytes: 0 %
Lymphocytes Relative: 18 %
Lymphs Abs: 2.1 10*3/uL (ref 0.7–4.0)
MCH: 34.2 pg — ABNORMAL HIGH (ref 26.0–34.0)
MCHC: 35.1 g/dL (ref 30.0–36.0)
MCV: 97.3 fL (ref 80.0–100.0)
Monocytes Absolute: 0.7 10*3/uL (ref 0.1–1.0)
Monocytes Relative: 6 %
Neutro Abs: 8.8 10*3/uL — ABNORMAL HIGH (ref 1.7–7.7)
Neutrophils Relative %: 76 %
Platelets: 176 10*3/uL (ref 150–400)
RBC: 4.74 MIL/uL (ref 3.87–5.11)
RDW: 12.1 % (ref 11.5–15.5)
WBC: 11.7 10*3/uL — ABNORMAL HIGH (ref 4.0–10.5)
nRBC: 0 % (ref 0.0–0.2)

## 2023-03-13 LAB — MAGNESIUM: Magnesium: 1.7 mg/dL (ref 1.7–2.4)

## 2023-03-13 LAB — LIPASE, BLOOD: Lipase: 23 U/L (ref 11–51)

## 2023-03-13 MED ORDER — DIPHENHYDRAMINE HCL 50 MG/ML IJ SOLN
12.5000 mg | Freq: Once | INTRAMUSCULAR | Status: AC
  Administered 2023-03-13: 12.5 mg via INTRAVENOUS
  Filled 2023-03-13: qty 1

## 2023-03-13 MED ORDER — LACTATED RINGERS IV BOLUS
1000.0000 mL | Freq: Once | INTRAVENOUS | Status: AC
  Administered 2023-03-13: 1000 mL via INTRAVENOUS

## 2023-03-13 MED ORDER — POTASSIUM CHLORIDE CRYS ER 20 MEQ PO TBCR
40.0000 meq | EXTENDED_RELEASE_TABLET | Freq: Once | ORAL | Status: AC
  Administered 2023-03-13: 40 meq via ORAL

## 2023-03-13 MED ORDER — KETOROLAC TROMETHAMINE 15 MG/ML IJ SOLN
15.0000 mg | Freq: Once | INTRAMUSCULAR | Status: AC
  Administered 2023-03-13: 15 mg via INTRAVENOUS
  Filled 2023-03-13: qty 1

## 2023-03-13 MED ORDER — METOCLOPRAMIDE HCL 5 MG/ML IJ SOLN
10.0000 mg | Freq: Once | INTRAMUSCULAR | Status: AC
  Administered 2023-03-13: 10 mg via INTRAVENOUS
  Filled 2023-03-13: qty 2

## 2023-03-13 NOTE — Discharge Instructions (Addendum)
Drink plenty of fluids to stay hydrated and replace recent fluid losses.  Return to the emergency department for any new or worsening symptoms of concern.

## 2023-03-13 NOTE — ED Triage Notes (Signed)
Pt c/o headache and emesis x3 day, SOB and palpitations x1 day, and sinus/facial pain starting today.  Pain score 6/10.  Pt easily able to speak full sentences.  Hx of CHF.  Pt denies weight gain/edema.

## 2023-03-13 NOTE — ED Provider Notes (Signed)
[facility] Provider Note   CSN: [id number] Arrival date & time: 03/13/23  0725     History  Chief Complaint  Patient presents with   Emesis   Headache   Shortness of Breath    Kelli [name] is a 52 y.o. female.   Emesis Associated symptoms: headaches   Headache Associated symptoms: dizziness, fatigue, nausea, sinus pressure and vomiting   Shortness of Breath Associated symptoms: headaches and vomiting   Patient presents for multiple complaints.  Medical history includes T2DM, HTN, HLD, depression, anxiety, CHF, GERD, anemia, migraine headaches.  3 days ago, she had onset of nausea and vomiting.  She subsequently developed a bifrontal headache.  Over the past 2 days, she has had p.o. intolerance.  Starting yesterday, she has had pain in area of right maxillary sinus.  She does have a history of sinusitis.  She describes recent nasal drainage as yellow in color.  This occurred several days ago but has since improved.  She has not had any subjective fevers or chills.  She does have continued nausea.  She tried treating her symptoms at home but has had vomiting anytime she tries to take medicine.  Currently, she has ongoing nausea, headache, and sinus pain.  She also describes intermittent palpitations, dizziness, lightheadedness.  She has felt short of breath as well.      Home Medications Prior to Admission medications   Medication Sig Start Date End Date Taking? Authorizing Provider  albuterol (VENTOLIN HFA) 108 (90 Base) MCG/ACT inhaler Inhale 1 puff into the lungs as needed for wheezing or shortness of breath. 01/30/20  Yes [provider]  bisoprolol (ZEBETA) 5 MG tablet TAKE 1/2 TABLET BY MOUTH DAILY - please schedule APPOINTMENT FOR more refills 10/30/22  Yes Strader, [place], PA-C  buPROPion (WELLBUTRIN XL) 300 MG 24 hr tablet Take 300 mg by mouth daily. 09/08/22  Yes [provider]  busPIRone (BUSPAR) 5 MG  tablet Take 5 mg by mouth 2 (two) times daily. 09/20/22  Yes [provider]  Cholecalciferol (VITAMIN D3) 25 MCG (1000 UT) CAPS Take 1 capsule by mouth daily.   Yes [provider]  cyanocobalamin (VITAMIN B12) 1000 MCG tablet Take 1,000 mcg by mouth daily.   Yes [provider]  escitalopram (LEXAPRO) 20 MG tablet Take 20 mg by mouth daily. 03/23/22  Yes [provider]  furosemide (LASIX) 20 MG tablet Take 1 tablet (20 mg total) by mouth daily. 05/30/22 05/25/23 Yes Strader, Fransisco Hertz, PA-C  HYDROcodone-acetaminophen (NORCO) 7.5-325 MG tablet Take 1 tablet by mouth 2 (two) times daily. 01/15/23  Yes [provider]  magnesium oxide (MAG-OX) 400 (240 Mg) MG tablet Take 400 mg by mouth daily.   Yes [provider]  metFORMIN (GLUCOPHAGE-XR) 500 MG 24 hr tablet Take 2 tablets (1,000 mg total) by mouth 2 (two) times daily with a meal. 05/04/22  Yes Johnson, Clanford L, MD  ondansetron (ZOFRAN-ODT) 4 MG disintegrating tablet Take 4 mg by mouth every 8 (eight) hours as needed. 09/20/22  Yes [provider]  OZEMPIC, 1 MG/DOSE, 4 MG/3ML SOPN Inject 1 mg into the skin once a week. 02/02/23  Yes [provider]  pantoprazole (PROTONIX) 40 MG tablet TAKE 1 TABLET BY MOUTH DAILY 10/30/22  Yes Strader, [place] M, PA-C  pregabalin (LYRICA) 25 MG capsule Take 25 mg by mouth 2 (two) times daily. 03/05/23  Yes [provider]  rosuvastatin (CRESTOR) 5 MG tablet TAKE 1  TABLET BY MOUTH DAILY 10/02/22  Yes Strader, [place] M, PA-C  traZODone (DESYREL) 50 MG tablet Take 50 mg by mouth at bedtime. 09/08/22  Yes [provider]  acetaminophen (TYLENOL) 325 MG tablet Take 650 mg by mouth as needed.    [provider]  blood glucose meter kit and supplies Dispense based on patient and insurance preference. Use up to four times daily as directed. (FOR ICD-10 E10.9, E11.9). 05/04/22   Johnson, Clanford L, MD  Insulin Pen Needle 31G  X 5 MM MISC 1 Device by Does not apply route as directed. 05/04/22   Johnson, Clanford L, MD  methocarbamol 1000 MG TABS Take 1,000 mg by mouth in the morning and at bedtime. Patient not taking: Reported on 05/30/2022 05/25/22   Redwine, Madison A, PA-C  nitrofurantoin, macrocrystal-monohydrate, (MACROBID) 100 MG capsule Take 1 capsule (100 mg total) by mouth 2 (two) times daily. Patient not taking: Reported on 05/30/2022 05/25/22   Redwine, Madison A, PA-C  ondansetron (ZOFRAN) 4 MG tablet Take 1 tablet (4 mg total) by mouth every 6 (six) hours. Patient not taking: Reported on 03/13/2023 05/25/22   Redwine, Madison A, PA-C  polyethylene glycol (MIRALAX / GLYCOLAX) 17 g packet Take 17 g by mouth daily as needed for mild constipation. 05/04/22   Johnson, Clanford L, MD  potassium chloride (KLOR-CON M) 10 MEQ tablet TAKE 1 TABLET BY MOUTH DAILY Patient not taking: Reported on 03/13/2023 10/30/22   Erma Heritage, PA-C  spironolactone (ALDACTONE) 25 MG tablet TAKE 1/2 TABLET BY MOUTH DAILY Patient not taking: Reported on 03/13/2023 10/30/22   Erma Heritage, PA-C  SUMAtriptan (IMITREX) 100 MG tablet Take 100 mg by mouth daily as needed for migraine or headache. Patient not taking: Reported on 03/13/2023 08/04/22   [provider]  traMADol (ULTRAM) 50 MG tablet Take 50 mg by mouth every 6 (six) hours as needed. Patient not taking: Reported on 03/13/2023 09/20/22   [provider]      Allergies    Aspirin and Neosporin [neomycin-bacitracin zn-polymyx]    Review of Systems   Review of Systems  Constitutional:  Positive for fatigue.  HENT:  Positive for sinus pressure and sinus pain.   Respiratory:  Positive for shortness of breath.   Cardiovascular:  Positive for palpitations.  Gastrointestinal:  Positive for nausea and vomiting.  Neurological:  Positive for dizziness, light-headedness and headaches.    Physical Exam Updated Vital Signs BP (!) 115/90   Pulse 90   Temp 98.1 F  (36.7 C) (Oral)   Resp 17   Ht 5\' 4"  (1.626 m)   Wt 72.6 kg   LMP 11/25/2019   SpO2 97%   BMI 27.46 kg/m  Physical Exam Vitals and nursing note reviewed.  Constitutional:      General: She is not in acute distress.    Appearance: She is well-developed. She is not ill-appearing, toxic-appearing or diaphoretic.  HENT:     Head: Normocephalic and atraumatic.  Eyes:     General: No scleral icterus.    Extraocular Movements: Extraocular movements intact.     Conjunctiva/sclera: Conjunctivae normal.  Cardiovascular:     Rate and Rhythm: Normal rate and regular rhythm.     Heart sounds: No murmur heard. Pulmonary:     Effort: Pulmonary effort is normal. No respiratory distress.     Breath sounds: Normal breath sounds. No wheezing or rales.  Chest:     Chest wall: No tenderness.  Abdominal:  General: There is no distension.     Palpations: Abdomen is soft.     Tenderness: There is no abdominal tenderness.  Musculoskeletal:        General: No swelling.     Cervical back: Normal range of motion and neck supple.  Skin:    General: Skin is warm and dry.     Capillary Refill: Capillary refill takes less than 2 seconds.     Coloration: Skin is not cyanotic or pale.  Neurological:     Mental Status: She is alert and oriented to person, place, and time.     Cranial Nerves: No cranial nerve deficit, dysarthria or facial asymmetry.     Sensory: No sensory deficit.     Motor: No weakness.     Coordination: Coordination normal.  Psychiatric:        Mood and Affect: Mood normal.        Behavior: Behavior normal.     ED Results / Procedures / Treatments   Labs (all labs ordered are listed, but only abnormal results are displayed) Labs Reviewed  CBC WITH DIFFERENTIAL/PLATELET - Abnormal; Notable for the following components:      Result Value   WBC 11.7 (*)    Hemoglobin 16.2 (*)    HCT 46.1 (*)    MCH 34.2 (*)    Neutro Abs 8.8 (*)    All other components within normal  limits  COMPREHENSIVE METABOLIC PANEL - Abnormal; Notable for the following components:   Sodium 134 (*)    Potassium 3.0 (*)    Chloride 91 (*)    Glucose, Bld 241 (*)    AST 9 (*)    Total Bilirubin 1.5 (*)    Anion gap 17 (*)    All other components within normal limits  URINALYSIS, ROUTINE W REFLEX MICROSCOPIC - Abnormal; Notable for the following components:   Color, Urine AMBER (*)    APPearance HAZY (*)    Ketones, ur 20 (*)    Protein, ur 30 (*)    Bacteria, UA RARE (*)    All other components within normal limits  MAGNESIUM  LIPASE, BLOOD  CBG MONITORING, ED  I-STAT CHEM 8, ED    EKG EKG Interpretation  Date/Time:  Tuesday March 13 2023 07:46:08 EDT Ventricular Rate:  94 PR Interval:  140 QRS Duration: 94 QT Interval:  368 QTC Calculation: 461 R Axis:   44 Text Interpretation: Sinus rhythm Borderline low voltage, extremity leads Confirmed by Godfrey Pick ([id number]) on 03/13/2023 9:54:24 AM  Radiology CT HEAD WO CONTRAST  Result Date: 03/13/2023 CLINICAL DATA:  Headaches EXAM: CT HEAD WITHOUT CONTRAST TECHNIQUE: Contiguous axial images were obtained from the base of the skull through the vertex without intravenous contrast. RADIATION DOSE REDUCTION: This exam was performed according to the departmental dose-optimization program which includes automated exposure control, adjustment of the mA and/or kV according to patient size and/or use of iterative reconstruction technique. COMPARISON:  09/13/2021 FINDINGS: Brain: No evidence of acute infarction, hemorrhage, hydrocephalus, extra-axial collection or mass lesion/mass effect. Cavum septum pellucidum is noted and stable. Vascular: No hyperdense vessel or unexpected calcification. Skull: Normal. Negative for fracture or focal lesion. Sinuses/Orbits: No acute finding. Other: None. IMPRESSION: No acute intracranial abnormality noted. Electronically Signed   By: Inez Catalina M.D.   On: 03/13/2023 09:47   DG Chest 2 View  Result  Date: 03/13/2023 CLINICAL DATA:  Shortness of breath EXAM: CHEST - 2 VIEW COMPARISON:  Previous studies including the CT  done on 04/30/2022 FINDINGS: Cardiac size is within normal limits. There are no signs of pulmonary edema or focal pulmonary consolidation. There is marked interval improvement in aeration in both lungs. There is no pleural effusion or pneumothorax. IMPRESSION: No active cardiopulmonary disease. Electronically Signed   By: Elmer Picker M.D.   On: 03/13/2023 08:14    Procedures Procedures    Medications Ordered in ED Medications  ketorolac (TORADOL) 15 MG/ML injection 15 mg (15 mg Intravenous Given 03/13/23 0912)  metoCLOPramide (REGLAN) injection 10 mg (10 mg Intravenous Given 03/13/23 0911)  diphenhydrAMINE (BENADRYL) injection 12.5 mg (12.5 mg Intravenous Given 03/13/23 0912)  lactated ringers bolus 1,000 mL (0 mLs Intravenous Stopped 03/13/23 1308)  potassium chloride SA (KLOR-CON M) CR tablet 40 mEq (40 mEq Oral Given 03/13/23 1115)    ED Course/ Medical Decision Making/ A&P                             Medical Decision Making Amount and/or Complexity of Data Reviewed Labs: ordered. Radiology: ordered.  Risk Prescription drug management.   This patient presents to the ED for concern of multiple complaints, this involves an extensive number of treatment options, and is a complaint that carries with it a high risk of complications and morbidity.  The differential diagnosis includes migraine headache, dehydration, enteritis, polypharmacy, medication withdrawal, metabolic derangements   Co morbidities that complicate the patient evaluation  T2DM, HTN, HLD, depression, anxiety, CHF, GERD, anemia, migraine headaches   Additional history obtained:  Additional history obtained from N/A External records from outside source obtained and reviewed including EMR   Lab Tests:  I Ordered, and personally interpreted labs.  The pertinent results include: Hypokalemia is  present with otherwise normal electrolytes, mild leukocytosis is present.  Hemoglobin is elevated consistent with hemoconcentration.  Glucose is moderately elevated.  Bicarb is normal.  There are minimal ketones on urinalysis.  UA not consistent with UTI.   Imaging Studies ordered:  I ordered imaging studies including chest x-ray, CT head I independently visualized and interpreted imaging which showed no acute findings I agree with the radiologist interpretation   Cardiac Monitoring: / EKG:  The patient was maintained on a cardiac monitor.  I personally viewed and interpreted the cardiac monitored which showed an underlying rhythm of: Sinus rhythm  Problem List / ED Course / Critical interventions / Medication management  Patient presents for multiple complaints.  Vital signs on arrival are unremarkable.  Patient is well-appearing on exam.  Chief among her complaints are nausea, vomiting and p.o. intolerance.  She states that she has not been able to tolerate any food, medication or drink for the past 2 days.  She does have a history of migraine headaches and has developed a bifrontal headache since onset of nausea and vomiting.  She has no focal neurologic deficits on exam.  Despite her reported shortness of breath, breathing is currently unlabored and lungs are clear to auscultation.  SpO2 is 100% on room air.  EKG shows normal sinus rhythm.  Patient was placed on bedside cardiac monitor.  IV fluids were ordered for hydration.  Headache cocktail was ordered.  Lab work was notable for hypokalemia.  Replacement potassium was ordered.  CT imaging did not show any acute findings, including in areas of sinuses.  On reassessment, patient reports resolution of headache and nausea.  She was given fluids to drink.  Patient is requesting discharge home at this time.  She  declines any new prescriptions.  She does have medication at home for headaches and nausea.  She was advised to return for any worsening of  symptoms.  She was discharged in stable condition. I ordered medication including IV fluids for hydration; Toradol, Reglan, Benadryl for headache; potassium chloride for hypokalemia Reevaluation of the patient after these medicines showed that the patient resolved I have reviewed the patients home medicines and have made adjustments as needed   Social Determinants of Health:  Has PCP        Final Clinical Impression(s) / ED Diagnoses Final diagnoses:  Nausea and vomiting, unspecified vomiting type  Bad headache    Rx / DC Orders ED Discharge Orders     None         Godfrey Pick, MD 03/13/23 1332

## 2023-03-26 ENCOUNTER — Ambulatory Visit (HOSPITAL_COMMUNITY): Payer: PRIVATE HEALTH INSURANCE

## 2023-03-26 ENCOUNTER — Other Ambulatory Visit: Payer: Self-pay

## 2023-03-26 ENCOUNTER — Ambulatory Visit (HOSPITAL_COMMUNITY): Payer: Medicaid Other | Attending: Orthopedic Surgery

## 2023-03-26 DIAGNOSIS — M545 Low back pain, unspecified: Secondary | ICD-10-CM | POA: Diagnosis present

## 2023-03-26 NOTE — Therapy (Signed)
OUTPATIENT PHYSICAL THERAPY THORACOLUMBAR EVALUATION   Patient Name: Kelli [name] MRN: [id number] DOB:22-Oct-1971, 52 y.o., female Today's Date: 03/26/2023  END OF SESSION:  PT End of Session - 03/26/23 1357     Visit Number 1    Number of Visits 4    Date for PT Re-Evaluation 04/23/23    Authorization Type medicaid [employer]    Authorization Time Period submitted please check auth    PT Start Time 1355    PT Stop Time 1430    PT Time Calculation (min) 35 min    Activity Tolerance Patient tolerated treatment well    Behavior During Therapy [facility] for tasks assessed/performed             Past Medical History:  Diagnosis Date   Allergy    Anemia    Arthritis    knee   Chronic headache    Colon polyps 03/28/11   Depression    Diabetes mellitus    Diverticulosis    GAD (generalized anxiety disorder)    Gallstones    GERD (gastroesophageal reflux disease)    Heart murmur    Hyperlipidemia    Migraines    Obesity    Past Surgical History:  Procedure Laterality Date   BIOPSY  05/04/2022   Procedure: BIOPSY;  Surgeon: Corbin Ade, MD;  Location: [facility]O [facility];  Service: Endoscopy;;   CESAREAN SECTION  1991, 1995   CHOLECYSTECTOMY  1992   COLONOSCOPY     ESOPHAGEAL DILATION N/A 05/04/2022   Procedure: ESOPHAGEAL DILATION;  Surgeon: Corbin Ade, MD;  Location: [facility] ENDO SU[facility];  Service: Endoscopy;  Laterality: N/A;   ESOPHAGOGASTRODUODENOSCOPY (EGD) WITH PROPOFOL N/A 05/04/2022   Procedure: ESOPHAGOGASTRODUODENOSCOPY (EGD) WITH PROPOFOL;  Surgeon: Corbin Ade, MD;  Location: [facility];  Service: Endoscopy;  Laterality: N/A;   POLYPECTOMY     RHINOPLASTY  1993   TUBAL LIGATION Bilateral 1995   Patient Active Problem List   Diagnosis Date Noted   Community acquired pneumonia of left lower lobe of lung    Pressure injury of skin 05/03/2022   Constipation    Acute systolic CHF (congestive heart failure)/ new cardiomyopathy 04/28/2022   Hypomagnesemia 04/28/2022    Lobar pneumonia 04/26/2022   Hypokalemia 04/26/2022   Dysphagia 04/25/2022   Acute respiratory failure with hypoxia 04/24/2022   Chronic diarrhea 04/24/2022   Severe sepsis 04/24/2022   Diarrhea 10/26/2021   Loss of weight 10/26/2021   Lower abdominal pain 10/26/2021   Bloating 10/26/2021   Uncontrolled type 2 diabetes mellitus with hyperglycemia, without long-term current use of insulin 02/25/2011   Hypertension 02/25/2011   Hyperlipidemia 02/25/2011   Depression 02/25/2011   Generalized anxiety disorder 02/25/2011    PCP: Grace Bushy, FNP  REFERRING PROVIDER: Tanda Rockers, MD  REFERRING DIAG:  SACROILIAC INFLAMMATION LOW BACK OAIN UNSPECIFIED BACK PAIN LATERALITY UNSPECIFIED CHRONICITY UNSPECIFIED WHETHER SCIATICA PRESENT    Rationale for Evaluation and Treatment: Rehabilitation  THERAPY DIAG:  Low back pain, unspecified back pain laterality, unspecified chronicity, unspecified whether sciatica present  ONSET DATE: worsened May of 2023   SUBJECTIVE:  SUBJECTIVE STATEMENT: Was in the hospital back in May 2023 with aspiration pneumonia; since then has had worsening back pain; she reports she has seen multiple physicians and "they won't do anything until I have some physical therapy"  PERTINENT HISTORY:  Headaches almost daily Taking Lyrica and seems to affect "my memory" Has lost a significant amount of weight in the last year ~ 100 lbs  PAIN:  Are you having pain? Yes: NPRS scale: 2/10 Pain location: low back and down into her buttocks and legs and shoots down to toes Pain description: shooting, sharp, sore, aching Aggravating factors: mornings Relieving factors: Norco and meloxicam, lidocaine patch  PRECAUTIONS: None  WEIGHT BEARING RESTRICTIONS: No  FALLS:  Has  patient fallen in last 6 months? Yes. Number of falls 1; tripped over high heels and in gravel  OCCUPATION: not working  PLOF: Independent  PATIENT GOALS: get stronger, move better and less pain; get an MRI  NEXT MD VISIT: when I get done with therapy  OBJECTIVE:   DIAGNOSTIC FINDINGS:  none  PATIENT SURVEYS:  Modified Oswestry 25/50 = 50%   COGNITION: Overall cognitive status: Within functional limits for tasks assessed and reports some memory      SENSATION: WFL  POSTURE: rounded shoulders and forward head  PALPATION: Low back; SI joint area  LUMBAR ROM:   AROM eval  Flexion 70% available pulling down leg  Extension 50% available Pain in low back  Right lateral flexion To 1 inch above knee  Left lateral flexion To 1 inch above knee  Right rotation   Left rotation    (Blank rows = not tested)    LOWER EXTREMITY MMT:    MMT Right eval Left eval  Hip flexion 4- 4-  Hip extension 4* 4*  Hip abduction    Hip adduction    Hip internal rotation    Hip external rotation    Knee flexion 4 4-  Knee extension 5 4  Ankle dorsiflexion 5 4  Ankle plantarflexion    Ankle inversion    Ankle eversion     (Blank rows = not tested)  FUNCTIONAL TESTS:  5 times sit to stand: 28.53 sec using hands on thighs to push up   TODAY'S TREATMENT:                                                                                                                              DATE: 03/26/23 physical therapy evaluation     PATIENT EDUCATION:  Education details: Patient educated on exam findings, POC, scope of PT, HEP, and what to expect next visit. Person educated: Patient Education method: Explanation, Demonstration, and Handouts Education comprehension: verbalized understanding, returned demonstration, verbal cues required, and tactile cues required   HOME EXERCISE PROGRAM: Access Code: [email or web address] URL: [email or web address] Date: 03/26/2023 Prepared by: [facility] -  Rehab  Exercises - Supine Transversus Abdominis Bracing - Hands on Stomach  - 2-3 x daily - 7 x weekly -  1 sets - 10 reps - Supine Bridge  - 2-3 x daily - 7 x weekly - 1 sets - 10 reps  ASSESSMENT:  CLINICAL IMPRESSION: Patient is a 52 y.o. female who was seen today for physical therapy evaluation and treatment for  SACROILIAC INFLAMMATION LOW BACK OAIN UNSPECIFIED BACK PAIN LATERALITY UNSPECIFIED CHRONICITY UNSPECIFIED WHETHER SCIATICA PRESENT  Patient  presents to physical therapy with complaint of back pain and pain that radiates down her legs. Patient demonstrates muscle weakness, reduced ROM, and fascial restrictions which are likely contributing to symptoms of pain and are negatively impacting patient ability to perform ADLs and functional mobility tasks. Patient will benefit from skilled physical therapy services to address these deficits to reduce pain and improve level of function with ADLs and functional mobility tasks. .   OBJECTIVE IMPAIRMENTS: decreased activity tolerance, decreased endurance, decreased knowledge of condition, decreased mobility, difficulty walking, decreased ROM, decreased strength, hypomobility, increased fascial restrictions, impaired perceived functional ability, impaired flexibility, and pain.   ACTIVITY LIMITATIONS: carrying, lifting, bending, sitting, standing, squatting, sleeping, stairs, transfers, locomotion level, and caring for others  PARTICIPATION LIMITATIONS: meal prep, cleaning, laundry, shopping, community activity, and yard work  [facility] POTENTIAL: Good  CLINICAL DECISION MAKING: Stable/uncomplicated  EVALUATION COMPLEXITY: Low   GOALS: Goals reviewed with patient? No  SHORT TERM GOALS: Target date: 04/09/2023  patient will be independent with initial HEP   Baseline: Goal status: INITIAL  2.  Patient will self report 30% improvement to improve tolerance for functional activity   Baseline:  Goal status: INITIAL  LONG TERM GOALS:  Target date: 04/23/2023  Patient will be independent in self management strategies to improve quality of life and functional outcomes. Baseline:  Goal status: INITIAL  2.  Patient will self report 50% improvement to improve tolerance for functional activity   Baseline:  Goal status: INITIAL  3.  Patient will improve 5 times sit to stand score from 28.53 sec to 23 sec or less without UE assist to demonstrate improved functional mobility and increased lower extremity strength.  Baseline:  Goal status: INITIAL  4.  Patient will increase  leg MMTs to 4+-5/5 without pain to promote return to ambulation community distances with minimal deviation.    Baseline: see above Goal status: INITIAL  5.  Patient will improve Modified Oswestry Score by 10 points to 35/50 to demonstrate improved functional mobility at home and in community. Baseline: 25/50 Goal status: INITIAL  PLAN:  PT FREQUENCY: 1x/week  PT DURATION: 4 weeks  PLANNED INTERVENTIONS: Therapeutic exercises, Therapeutic activity, Neuromuscular re-education, Balance training, Gait training, Patient/Family education, Joint manipulation, Joint mobilization, Stair training, Orthotic/Fit training, DME instructions, Aquatic Therapy, Dry Needling, Electrical stimulation, Spinal manipulation, Spinal mobilization, Cryotherapy, Moist heat, Compression bandaging, scar mobilization, Splintting, Taping, Traction, Ultrasound, Ionotophoresis /ml Dexamethasone, and Manual therapy .  PLAN FOR NEXT SESSION: Review HEP and goals; progress lumbar and core strengthening; add in hamstring and lumbar stretching as appropriate  2:36 PM, 03/26/23 Silena Wyss Small Sabrine Patchen MPT [facility] [place] [phone] Ph:[phone]

## 2023-04-02 ENCOUNTER — Ambulatory Visit (HOSPITAL_COMMUNITY): Payer: Medicaid Other

## 2023-04-02 DIAGNOSIS — M545 Low back pain, unspecified: Secondary | ICD-10-CM | POA: Diagnosis not present

## 2023-04-02 NOTE — Therapy (Signed)
OUTPATIENT PHYSICAL THERAPY THORACOLUMBAR EVALUATION   Patient Name: Kelli [name] MRN: [id number] DOB:10/02/1971, 52 y.o., female Today's Date: 04/02/2023  END OF SESSION:  PT End of Session - 04/02/23 1310     Visit Number 2    Number of Visits 4    Date for PT Re-Evaluation 04/23/23    Authorization Type medicaid UHC    Authorization Time Period submitted please check auth    PT Start Time 1310   late check in   PT Stop Time 1345    PT Time Calculation (min) 35 min    Activity Tolerance Patient tolerated treatment well    Behavior During Therapy [facility] for tasks assessed/performed             Past Medical History:  Diagnosis Date   Allergy    Anemia    Arthritis    knee   Chronic headache    Colon polyps 03/28/11   Depression    Diabetes mellitus    Diverticulosis    GAD (generalized anxiety disorder)    Gallstones    GERD (gastroesophageal reflux disease)    Heart murmur    Hyperlipidemia    Migraines    Obesity    Past Surgical History:  Procedure Laterality Date   BIOPSY  05/04/2022   Procedure: BIOPSY;  Surgeon: Corbin Ade, MD;  [facility];  Service: Endoscopy;;   CESAREAN SECTION  1991, 1995   CHOLECYSTECTOMY  1992   COLONOSCOPY     ESOPHAGEAL DILATION N/A 05/04/2022   Procedure: ESOPHAGEAL DILATION;  Surgeon: Corbin Ade, MD;  [facility];  Service: Endoscopy;  Laterality: N/A;   ESOPHAGOGASTRODUODENOSCOPY (EGD) WITH PROPOFOL N/A 05/04/2022   Procedure: ESOPHAGOGASTRODUODENOSCOPY (EGD) WITH PROPOFOL;  Surgeon: Corbin Ade, MD;  [facility];  Service: Endoscopy;  Laterality: N/A;   POLYPECTOMY     RHINOPLASTY  1993   TUBAL LIGATION Bilateral 1995   Patient Active Problem List   Diagnosis Date Noted   Community acquired pneumonia of left lower lobe of lung    Pressure injury of skin 05/03/2022   Constipation    Acute systolic CHF (congestive heart failure)/ new cardiomyopathy 04/28/2022    Hypomagnesemia 04/28/2022   Lobar pneumonia 04/26/2022   Hypokalemia 04/26/2022   Dysphagia 04/25/2022   Acute respiratory failure with hypoxia 04/24/2022   Chronic diarrhea 04/24/2022   Severe sepsis 04/24/2022   Diarrhea 10/26/2021   Loss of weight 10/26/2021   Lower abdominal pain 10/26/2021   Bloating 10/26/2021   Uncontrolled type 2 diabetes mellitus with hyperglycemia, without long-term current use of insulin 02/25/2011   Hypertension 02/25/2011   Hyperlipidemia 02/25/2011   Depression 02/25/2011   Generalized anxiety disorder 02/25/2011    PCP: Grace Bushy, FNP  REFERRING PROVIDER: Tanda Rockers, MD  REFERRING DIAG:  SACROILIAC INFLAMMATION LOW BACK OAIN UNSPECIFIED BACK PAIN LATERALITY UNSPECIFIED CHRONICITY UNSPECIFIED WHETHER SCIATICA PRESENT    Rationale for Evaluation and Treatment: Rehabilitation  THERAPY DIAG:  Low back pain, unspecified back pain laterality, unspecified chronicity, unspecified whether sciatica present  ONSET DATE: worsened May of 2023   SUBJECTIVE:  SUBJECTIVE STATEMENT: "Doing pretty good" feeling a little better; doing some walking and exercises; less nerve pain   Eval:Was in the hospital back in May 2023 with aspiration pneumonia; since then has had worsening back pain; she reports she has seen multiple physicians and "they won't do anything until I have some physical therapy"  PERTINENT HISTORY:  Headaches almost daily Taking Lyrica and seems to affect "my memory" Has lost a significant amount of weight in the last year ~ 100 lbs scoliosis PAIN:  Are you having pain? Yes: NPRS scale: 2/10 Pain location: low back and down into her buttocks and legs and shoots down to toes Pain description: shooting, sharp, sore, aching Aggravating factors:  mornings Relieving factors: Norco and meloxicam, lidocaine patch  PRECAUTIONS: None  WEIGHT BEARING RESTRICTIONS: No  FALLS:  Has patient fallen in last 6 months? Yes. Number of falls 1; tripped over high heels and in gravel  OCCUPATION: not working  PLOF: Independent  PATIENT GOALS: get stronger, move better and less pain; get an MRI  NEXT MD VISIT: when I get done with therapy  OBJECTIVE:   DIAGNOSTIC FINDINGS:  none  PATIENT SURVEYS:  Modified Oswestry 25/50 = 50%   COGNITION: Overall cognitive status: Within functional limits for tasks assessed and reports some memory      SENSATION: WFL  POSTURE: rounded shoulders and forward head  PALPATION: Low back; SI joint area  LUMBAR ROM:   AROM eval  Flexion 70% available pulling down leg  Extension 50% available Pain in low back  Right lateral flexion To 1 inch above knee  Left lateral flexion To 1 inch above knee  Right rotation   Left rotation    (Blank rows = not tested)    LOWER EXTREMITY MMT:    MMT Right eval Left eval  Hip flexion 4- 4-  Hip extension 4* 4*  Hip abduction    Hip adduction    Hip internal rotation    Hip external rotation    Knee flexion 4 4-  Knee extension 5 4  Ankle dorsiflexion 5 4  Ankle plantarflexion    Ankle inversion    Ankle eversion     (Blank rows = not tested)  FUNCTIONAL TESTS:  5 times sit to stand: 28.53 sec using hands on thighs to push up   TODAY'S TREATMENT:                                                                                                                              DATE:  04/02/23  Review of HEP and goals Supine: Abdominal bracing 5" hold x 10 Bridge with 3" hold x 10 SKTC 3 x 10" each Hamstring stretch 3 x 10" each LTR x 10 Prone lying (decreased pain) Prone on elbows (increased pain in back and did not improve so d/c) Thoracic rotation to the left lying on right x 10    03/26/23 physical therapy evaluation  PATIENT  EDUCATION:  Education details: Patient educated on exam findings, POC, scope of PT, HEP, and what to expect next visit. Person educated: Patient Education method: Explanation, Demonstration, and Handouts Education comprehension: verbalized understanding, returned demonstration, verbal cues required, and tactile cues required   HOME EXERCISE PROGRAM: 04/02/23 TA with marching, SKTC, hamstring stretch, thoracic rotation Access Code: [email or web address] URL: [email or web address] Date: 03/26/2023 Prepared by: [facility]  Exercises - Supine Transversus Abdominis Bracing - Hands on Stomach  - 2-3 x daily - 7 x weekly - 1 sets - 10 reps - Supine Bridge  - 2-3 x daily - 7 x weekly - 1 sets - 10 reps  ASSESSMENT:  CLINICAL IMPRESSION: Late check in; Today's session started with a review of HEP and goals.  Patient verbalizes agreement with set rehab goals. Patient needs cues for breathing properly with exercise. Trial of prone lying decreased pain but progressed extension did not decrease back pain so d/c; noted curve in the thoracic spine so added thoracic rotation.  Patient states she does have a history of scoliosis. Patient will benefit from continued skilled therapy services to address deficits and promote return to optimal function.      Eval:Patient is a 52 y.o. female who was seen today for physical therapy evaluation and treatment for  SACROILIAC INFLAMMATION LOW BACK OAIN UNSPECIFIED BACK PAIN LATERALITY UNSPECIFIED CHRONICITY UNSPECIFIED WHETHER SCIATICA PRESENT  Patient  presents to physical therapy with complaint of back pain and pain that radiates down her legs. Patient demonstrates muscle weakness, reduced ROM, and fascial restrictions which are likely contributing to symptoms of pain and are negatively impacting patient ability to perform ADLs and functional mobility tasks. Patient will benefit from skilled physical therapy services to address these deficits to reduce pain and  improve level of function with ADLs and functional mobility tasks. .   OBJECTIVE IMPAIRMENTS: decreased activity tolerance, decreased endurance, decreased knowledge of condition, decreased mobility, difficulty walking, decreased ROM, decreased strength, hypomobility, increased fascial restrictions, impaired perceived functional ability, impaired flexibility, and pain.   ACTIVITY LIMITATIONS: carrying, lifting, bending, sitting, standing, squatting, sleeping, stairs, transfers, locomotion level, and caring for others  PARTICIPATION LIMITATIONS: meal prep, cleaning, laundry, shopping, community activity, and yard work  [facility] POTENTIAL: Good  CLINICAL DECISION MAKING: Stable/uncomplicated  EVALUATION COMPLEXITY: Low   GOALS: Goals reviewed with patient? No  SHORT TERM GOALS: Target date: 04/09/2023  patient will be independent with initial HEP   Baseline: Goal status: IN PROGRESS  2.  Patient will self report 30% improvement to improve tolerance for functional activity   Baseline:  Goal status: IN PROGRESS  LONG TERM GOALS: Target date: 04/23/2023  Patient will be independent in self management strategies to improve quality of life and functional outcomes. Baseline:  Goal status: IN PROGRESS  2.  Patient will self report 50% improvement to improve tolerance for functional activity   Baseline:  Goal status: IN PROGRESS  3.  Patient will improve 5 times sit to stand score from 28.53 sec to 23 sec or less without UE assist to demonstrate improved functional mobility and increased lower extremity strength.  Baseline:  Goal status: IN PROGRESS  4.  Patient will increase  leg MMTs to 4+-5/5 without pain to promote return to ambulation community distances with minimal deviation.    Baseline: see above Goal status: IN PROGRESS  5.  Patient will improve Modified Oswestry Score by 10 points to 35/50 to demonstrate improved functional mobility at home and in  community. Baseline: 25/50  Goal status: IN PROGRESS  PLAN:  PT FREQUENCY: 1x/week  PT DURATION: 4 weeks  PLANNED INTERVENTIONS: Therapeutic exercises, Therapeutic activity, Neuromuscular re-education, Balance training, Gait training, Patient/Family education, Joint manipulation, Joint mobilization, Stair training, Orthotic/Fit training, DME instructions, Aquatic Therapy, Dry Needling, Electrical stimulation, Spinal manipulation, Spinal mobilization, Cryotherapy, Moist heat, Compression bandaging, scar mobilization, Splintting, Taping, Traction, Ultrasound, Ionotophoresis /ml Dexamethasone, and Manual therapy .  PLAN FOR NEXT SESSION: progress lumbar and core strengthening; add in hamstring and lumbar stretching as appropriate  1:49 PM, 04/02/23 Sharifa Bucholz Small Butch Otterson MPT [facility] [place] [phone] Ph:[phone]

## 2023-04-10 ENCOUNTER — Encounter (HOSPITAL_COMMUNITY): Payer: PRIVATE HEALTH INSURANCE | Admitting: Physical Therapy

## 2023-04-16 ENCOUNTER — Ambulatory Visit (HOSPITAL_COMMUNITY): Payer: Medicaid Other | Attending: Orthopedic Surgery | Admitting: Physical Therapy

## 2023-04-16 DIAGNOSIS — M545 Low back pain, unspecified: Secondary | ICD-10-CM | POA: Diagnosis present

## 2023-04-16 NOTE — Therapy (Signed)
OUTPATIENT PHYSICAL THERAPY  TREATMENT  Patient Name: Jonnetta Joshi MRN: [id number] DOB:[birth year], 52 y.o., female Today's Date: 04/16/2023  END OF SESSION:  PT End of Session - 04/16/23 1358     Visit Number 3    Number of Visits 4    Date for PT Re-Evaluation 04/23/23    Authorization Type medicaid UHC; no auth needed for over 21    Progress Note Due on Visit 0.02    PT Start Time 1350    PT Stop Time 1430    PT Time Calculation (min) 40 min    Activity Tolerance Patient tolerated treatment well    Behavior During Therapy [facility] for tasks assessed/performed             Past Medical History:  Diagnosis Date   Allergy    Anemia    Arthritis    knee   Chronic headache    Colon polyps 03/28/11   Depression    Diabetes mellitus    Diverticulosis    GAD (generalized anxiety disorder)    Gallstones    GERD (gastroesophageal reflux disease)    Heart murmur    Hyperlipidemia    Migraines    Obesity    Past Surgical History:  Procedure Laterality Date   BIOPSY  05/04/2022   Procedure: BIOPSY;  Surgeon: Corbin Ade, MD;  [facility];  Service: Endoscopy;;   CESAREAN SECTION  1991, 1995   CHOLECYSTECTOMY  1992   COLONOSCOPY     ESOPHAGEAL DILATION N/A 05/04/2022   Procedure: ESOPHAGEAL DILATION;  Surgeon: Corbin Ade, MD;  [facility];  Service: Endoscopy;  Laterality: N/A;   ESOPHAGOGASTRODUODENOSCOPY (EGD) WITH PROPOFOL N/A 05/04/2022   Procedure: ESOPHAGOGASTRODUODENOSCOPY (EGD) WITH PROPOFOL;  Surgeon: Corbin Ade, MD;  [facility];  Service: Endoscopy;  Laterality: N/A;   POLYPECTOMY     RHINOPLASTY  1993   TUBAL LIGATION Bilateral 1995   Patient Active Problem List   Diagnosis Date Noted   Community acquired pneumonia of left lower lobe of lung    Pressure injury of skin 05/03/2022   Constipation    Acute systolic CHF (congestive heart failure)/ new cardiomyopathy 04/28/2022   Hypomagnesemia 04/28/2022   Lobar  pneumonia (HCC) 04/26/2022   Hypokalemia 04/26/2022   Dysphagia 04/25/2022   Acute respiratory failure with hypoxia (HCC) 04/24/2022   Chronic diarrhea 04/24/2022   Severe sepsis (HCC) 04/24/2022   Diarrhea 10/26/2021   Loss of weight 10/26/2021   Lower abdominal pain 10/26/2021   Bloating 10/26/2021   Uncontrolled type 2 diabetes mellitus with hyperglycemia, without long-term current use of insulin (HCC) 02/25/2011   Hypertension 02/25/2011   Hyperlipidemia 02/25/2011   Depression 02/25/2011   Generalized anxiety disorder 02/25/2011    PCP: Grace Bushy, FNP  REFERRING PROVIDER: Tanda Rockers, MD  REFERRING DIAG:  SACROILIAC INFLAMMATION LOW BACK OAIN UNSPECIFIED BACK PAIN LATERALITY UNSPECIFIED CHRONICITY UNSPECIFIED WHETHER SCIATICA PRESENT    Rationale for Evaluation and Treatment: Rehabilitation  THERAPY DIAG:  Low back pain, unspecified back pain laterality, unspecified chronicity, unspecified whether sciatica present  ONSET DATE: worsened May of 2023   SUBJECTIVE:  SUBJECTIVE STATEMENT: Pt states she drove the the aquarium in [place]/outlet mall and the zoo and did fine with all of that.  Currently 7/10 in back and 5-6/10 in lt knee.    Eval:Was in the hospital back in May 2023 with aspiration pneumonia; since then has had worsening back pain; she reports she has seen multiple physicians and "they won't do anything until I have some physical therapy"  PERTINENT HISTORY:  Headaches almost daily Taking Lyrica and seems to affect "my memory" Has lost a significant amount of weight in the last year ~ 100 lbs scoliosis PAIN:  Are you having pain? Yes: NPRS scale: 2/10 Pain location: low back and down into her buttocks and legs and shoots down to toes Pain description:  shooting, sharp, sore, aching Aggravating factors: mornings Relieving factors: Norco and meloxicam, lidocaine patch  PRECAUTIONS: None  WEIGHT BEARING RESTRICTIONS: No  FALLS:  Has patient fallen in last 6 months? Yes. Number of falls 1; tripped over high heels and in gravel  OCCUPATION: not working  PLOF: Independent  PATIENT GOALS: get stronger, move better and less pain; get an MRI  NEXT MD VISIT: when I get done with therapy  OBJECTIVE:   DIAGNOSTIC FINDINGS:  none  PATIENT SURVEYS:  Modified Oswestry 25/50 = 50%   COGNITION: Overall cognitive status: Within functional limits for tasks assessed and reports some memory      SENSATION: WFL  POSTURE: rounded shoulders and forward head  PALPATION: Low back; SI joint area  LUMBAR ROM:   AROM eval  Flexion 70% available pulling down leg  Extension 50% available Pain in low back  Right lateral flexion To 1 inch above knee  Left lateral flexion To 1 inch above knee  Right rotation   Left rotation    (Blank rows = not tested)    LOWER EXTREMITY MMT:    MMT Right eval Left eval  Hip flexion 4- 4-  Hip extension 4* 4*  Hip abduction    Hip adduction    Hip internal rotation    Hip external rotation    Knee flexion 4 4-  Knee extension 5 4  Ankle dorsiflexion 5 4  Ankle plantarflexion    Ankle inversion    Ankle eversion     (Blank rows = not tested)  FUNCTIONAL TESTS:  5 times sit to stand: 28.53 sec using hands on thighs to push up   TODAY'S TREATMENT:                                                                                                                              DATE:  04/16/23 Supine: Abdominal bracing 5" hold x 10 Bridge with 3" hold 2x10 SKTC 5 x 10" each Hamstring stretch 5 x 10" each LTR 5X10" SLR 10X each with core stab Seated long sitting hamstring stretch 2X20"  Piriformis stretch 2X20" each  04/02/23  Review of HEP and goals Supine: Abdominal bracing 5"  hold x  10 Bridge with 3" hold x 10 SKTC 3 x 10" each Hamstring stretch 3 x 10" each LTR x 10 Prone lying (decreased pain) Prone on elbows (increased pain in back and did not improve so d/c) Thoracic rotation to the left lying on right x 10  03/26/23 physical therapy evaluation     PATIENT EDUCATION:  Education details: Patient educated on exam findings, POC, scope of PT, HEP, and what to expect next visit. Person educated: Patient Education method: Explanation, Demonstration, and Handouts Education comprehension: verbalized understanding, returned demonstration, verbal cues required, and tactile cues required   HOME EXERCISE PROGRAM: 04/02/23 TA with marching, SKTC, hamstring stretch, thoracic rotation Access Code: [email or web address] URL: [email or web address] Date: 03/26/2023 Prepared by: [facility]  Exercises - Supine Transversus Abdominis Bracing - Hands on Stomach  - 2-3 x daily - 7 x weekly - 1 sets - 10 reps - Supine Bridge  - 2-3 x daily - 7 x weekly - 1 sets - 10 reps  ASSESSMENT:  CLINICAL IMPRESSION:  Continued with focus on improving core stabilization and spinal mobilty.  Began seated piriformis and hamstring stretches with noted tightness and good stretch obtained.  Pt requires cues to increase hold times and for general repetition count. No reports of pain or visual pain behaviors during session today despite high pain ratings.  No new exercises given for HEP due to time constraints.  Will update next session. Patient will benefit from continued skilled therapy services to address deficits and promote return to optimal function.      Eval:Patient is a 52 y.o. female who was seen today for physical therapy evaluation and treatment for  SACROILIAC INFLAMMATION LOW BACK OAIN UNSPECIFIED BACK PAIN LATERALITY UNSPECIFIED CHRONICITY UNSPECIFIED WHETHER SCIATICA PRESENT  Patient  presents to physical therapy with complaint of back pain and pain that radiates down her legs.  Patient demonstrates muscle weakness, reduced ROM, and fascial restrictions which are likely contributing to symptoms of pain and are negatively impacting patient ability to perform ADLs and functional mobility tasks. Patient will benefit from skilled physical therapy services to address these deficits to reduce pain and improve level of function with ADLs and functional mobility tasks. .   OBJECTIVE IMPAIRMENTS: decreased activity tolerance, decreased endurance, decreased knowledge of condition, decreased mobility, difficulty walking, decreased ROM, decreased strength, hypomobility, increased fascial restrictions, impaired perceived functional ability, impaired flexibility, and pain.   ACTIVITY LIMITATIONS: carrying, lifting, bending, sitting, standing, squatting, sleeping, stairs, transfers, locomotion level, and caring for others  PARTICIPATION LIMITATIONS: meal prep, cleaning, laundry, shopping, community activity, and yard work  [facility] POTENTIAL: Good  CLINICAL DECISION MAKING: Stable/uncomplicated  EVALUATION COMPLEXITY: Low   GOALS: Goals reviewed with patient? No  SHORT TERM GOALS: Target date: 04/09/2023  patient will be independent with initial HEP   Baseline: Goal status: IN PROGRESS  2.  Patient will self report 30% improvement to improve tolerance for functional activity   Baseline:  Goal status: IN PROGRESS  LONG TERM GOALS: Target date: 04/23/2023  Patient will be independent in self management strategies to improve quality of life and functional outcomes. Baseline:  Goal status: IN PROGRESS  2.  Patient will self report 50% improvement to improve tolerance for functional activity   Baseline:  Goal status: IN PROGRESS  3.  Patient will improve 5 times sit to stand score from 28.53 sec to 23 sec or less without UE assist to demonstrate improved functional mobility and increased lower extremity strength.  Baseline:  Goal  status: IN PROGRESS  4.  Patient  will increase  leg MMTs to 4+-5/5 without pain to promote return to ambulation community distances with minimal deviation.    Baseline: see above Goal status: IN PROGRESS  5.  Patient will improve Modified Oswestry Score by 10 points to 35/50 to demonstrate improved functional mobility at home and in community. Baseline: 25/50 Goal status: IN PROGRESS  PLAN:  PT FREQUENCY: 1x/week  PT DURATION: 4 weeks  PLANNED INTERVENTIONS: Therapeutic exercises, Therapeutic activity, Neuromuscular re-education, Balance training, Gait training, Patient/Family education, Joint manipulation, Joint mobilization, Stair training, Orthotic/Fit training, DME instructions, Aquatic Therapy, Dry Needling, Electrical stimulation, Spinal manipulation, Spinal mobilization, Cryotherapy, Moist heat, Compression bandaging, scar mobilization, Splintting, Taping, Traction, Ultrasound, Ionotophoresis 4mg /ml Dexamethasone, and Manual therapy .  PLAN FOR NEXT SESSION: progress lumbar and core strengthening; add in hamstring and lumbar stretching as appropriate.  Update HEP next session.  1:59 PM, 04/16/23 Lurena Nida, PTA/CLT [facility] Ph: [phone]

## 2023-04-23 ENCOUNTER — Encounter (HOSPITAL_COMMUNITY): Payer: PRIVATE HEALTH INSURANCE

## 2023-04-26 ENCOUNTER — Encounter (HOSPITAL_COMMUNITY): Payer: PRIVATE HEALTH INSURANCE

## 2023-04-27 ENCOUNTER — Ambulatory Visit: Payer: PRIVATE HEALTH INSURANCE | Admitting: Student

## 2023-05-03 ENCOUNTER — Ambulatory Visit: Payer: Medicaid Other | Admitting: Neurology

## 2023-05-11 ENCOUNTER — Encounter (HOSPITAL_COMMUNITY): Payer: PRIVATE HEALTH INSURANCE

## 2023-05-31 ENCOUNTER — Ambulatory Visit: Payer: Medicaid Other | Attending: Orthopedic Surgery | Admitting: Nurse Practitioner

## 2023-05-31 ENCOUNTER — Encounter: Payer: Self-pay | Admitting: Nurse Practitioner

## 2023-05-31 VITALS — BP 100/70 | HR 67 | Ht 64.5 in | Wt 173.6 lb

## 2023-05-31 DIAGNOSIS — E782 Mixed hyperlipidemia: Secondary | ICD-10-CM | POA: Insufficient documentation

## 2023-05-31 DIAGNOSIS — I872 Venous insufficiency (chronic) (peripheral): Secondary | ICD-10-CM | POA: Insufficient documentation

## 2023-05-31 DIAGNOSIS — R600 Localized edema: Secondary | ICD-10-CM | POA: Insufficient documentation

## 2023-05-31 DIAGNOSIS — I5032 Chronic diastolic (congestive) heart failure: Secondary | ICD-10-CM | POA: Diagnosis not present

## 2023-05-31 DIAGNOSIS — Z79899 Other long term (current) drug therapy: Secondary | ICD-10-CM | POA: Diagnosis not present

## 2023-05-31 DIAGNOSIS — I428 Other cardiomyopathies: Secondary | ICD-10-CM | POA: Diagnosis not present

## 2023-05-31 MED ORDER — SPIRONOLACTONE 25 MG PO TABS: 12.5000 mg | ORAL_TABLET | Freq: Every day | ORAL | 0 refills | Status: DC

## 2023-05-31 MED ORDER — FUROSEMIDE 20 MG PO TABS: ORAL_TABLET | ORAL | 3 refills | Status: DC

## 2023-05-31 MED ORDER — POTASSIUM CHLORIDE CRYS ER 10 MEQ PO TBCR: EXTENDED_RELEASE_TABLET | ORAL | 0 refills | Status: DC

## 2023-05-31 MED ORDER — ELASTIC BANDAGE MISC: 1.0000 | Freq: Once | 0 refills | Status: DC | PRN

## 2023-05-31 MED ORDER — ROSUVASTATIN CALCIUM 10 MG PO TABS: 10.0000 mg | ORAL_TABLET | Freq: Every day | ORAL | 3 refills | Status: DC

## 2023-05-31 MED ORDER — BISOPROLOL FUMARATE 5 MG PO TABS: ORAL_TABLET | ORAL | 0 refills | Status: DC

## 2023-05-31 NOTE — Progress Notes (Signed)
Office Visit    Patient Name: Hyland Verhaeghe Date of Encounter: 05/31/2023  PCP:  Trisha Mangle, FNP    [facility]  Cardiologist:  Christell Constant, MD  Advanced Practice Provider:  No care team member to display Electrophysiologist:  None   Chief Complaint    Jakylee Linberg is a 52 y.o. female with a hx of hyperlipidemia, HFrecEF/NICM, type 2 diabetes, anemia, GAD, migraines, obesity, depression, and diverticulosis, who presents today for scheduled follow-up.  Past Medical History    Past Medical History:  Diagnosis Date   Allergy    Anemia    Arthritis    knee   Chronic headache    Colon polyps 03/28/11   Depression    Diabetes mellitus    Diverticulosis    GAD (generalized anxiety disorder)    Gallstones    GERD (gastroesophageal reflux disease)    Heart murmur    Hyperlipidemia    Migraines    Obesity    Past Surgical History:  Procedure Laterality Date   BIOPSY  05/04/2022   Procedure: BIOPSY;  Surgeon: Corbin Ade, MD;  [facility];  Service: Endoscopy;;   CESAREAN SECTION  1991, 1995   CHOLECYSTECTOMY  1992   COLONOSCOPY     ESOPHAGEAL DILATION N/A 05/04/2022   Procedure: ESOPHAGEAL DILATION;  Surgeon: Corbin Ade, MD;  Location: [facility];  Service: Endoscopy;  Laterality: N/A;   ESOPHAGOGASTRODUODENOSCOPY (EGD) WITH PROPOFOL N/A 05/04/2022   Procedure: ESOPHAGOGASTRODUODENOSCOPY (EGD) WITH PROPOFOL;  Surgeon: Corbin Ade, MD;  Location: [facility];  Service: Endoscopy;  Laterality: N/A;   POLYPECTOMY     RHINOPLASTY  1993   TUBAL LIGATION Bilateral 1995    Allergies  Allergies  Allergen Reactions   Aspirin     Vomiting and nausea   Neosporin [Neomycin-Bacitracin Zn-Polymyx] Rash    "blisters"    History of Present Illness    Zaylie Boniello is a 52 y.o. female with a PMH as mentioned above.  Admitted May 2023 at [facility] for severe sepsis, found to have UTI and  pneumonia.  Echo revealed EF reduced at 40 to 45%, cardiology consulted.  Was started on Aldactone, Lasix, bisoprolol, BP did not allow for ACE inhibitor/ARB, was not on SGLT2 inhibitor given UTI.  Presented back to ED in June 2023 for abdominal pain and low back pain.  CT of abdomen revealed nonobstructive kidney stone, nothing acute.  Was found to have UTI and discharged on ABX.  Last seen by Randall An, PA-C 1 year ago.  Was doing well from a cardiac perspective.  Repeat limited echo revealed normalization of EF with no wall motion abnormalities.  Today she presents for 1 year follow-up.  She states she is feeling great. Denies any chest pain, shortness of breath, palpitations, syncope, presyncope, dizziness, orthopnea, PND, swelling or significant weight changes, acute bleeding, or claudication.  SH: Works at [employer] in [place], [place]   EKGs/Labs/Other Studies Reviewed:   The following studies were reviewed today:   EKG:  EKG is ordered today.  The ekg ordered today demonstrates NSR.   Limited Echo 06/2022:   1. Limited study.   2. Left ventricular ejection fraction, by estimation, is 55 to 60%. The  left ventricle has normal function. The left ventricle has no regional  wall motion abnormalities. Left ventricular diastolic parameters were  normal. The average left ventricular  global longitudinal strain is -21.9 %. The global longitudinal strain  is  normal.   3. Right ventricular systolic function is normal. The right ventricular  size is normal.   4. The aortic valve is tricuspid.   Comparison(s): Prior images reviewed side by side. LVEF has improved to  normal range, 55-60%. Recent Labs: 03/13/2023: ALT 10; BUN 19; Creatinine, Ser 0.94; Hemoglobin 16.2; Magnesium 1.7; Platelets 176; Potassium 3.0; Sodium 134  Recent Lipid Panel    Component Value Date/Time   CHOL 143 09/17/2015 1342   TRIG 137 09/17/2015 1342   HDL 52 09/17/2015 1342   CHOLHDL 2.8 09/17/2015  1342   VLDL 27 09/17/2015 1342   LDLCALC 64 09/17/2015 1342    Risk Assessment/Calculations:   The 10-year ASCVD risk score (Arnett DK, et al., 2019) is: 6.3%   Values used to calculate the score:     Age: 64 years     Sex: Female     Is Non-Hispanic African American: No     Diabetic: Yes     Tobacco smoker: Yes     Systolic Blood Pressure: 100 mmHg     Is BP treated: Yes     HDL Cholesterol: 56 mg/dL     Total Cholesterol: 204 mg/dL   Home Medications   Current Meds  Medication Sig   acetaminophen (TYLENOL) 325 MG tablet Take 650 mg by mouth as needed.   albuterol (VENTOLIN HFA) 108 (90 Base) MCG/ACT inhaler Inhale 1 puff into the lungs as needed for wheezing or shortness of breath.   blood glucose meter kit and supplies Dispense based on patient and insurance preference. Use up to four times daily as directed. (FOR ICD-10 E10.9, E11.9).   buPROPion (WELLBUTRIN XL) 300 MG 24 hr tablet Take 300 mg by mouth daily.   busPIRone (BUSPAR) 10 MG tablet Take 10 mg by mouth 2 (two) times daily.   Cholecalciferol (VITAMIN D3) 25 MCG (1000 UT) CAPS Take 1 capsule by mouth daily.   cyanocobalamin (VITAMIN B12) 1000 MCG tablet Take 1,000 mcg by mouth daily.   Elastic Bandage MISC 1 each by Does not apply route Once PRN for up to 1 dose.   escitalopram (LEXAPRO) 20 MG tablet Take 20 mg by mouth daily.   HYDROcodone-acetaminophen (NORCO) 7.5-325 MG tablet Take 1 tablet by mouth 2 (two) times daily.   Insulin Pen Needle 31G X 5 MM MISC 1 Device by Does not apply route as directed.   magnesium oxide (MAG-OX) 400 (240 Mg) MG tablet Take 400 mg by mouth daily.   metFORMIN (GLUCOPHAGE-XR) 500 MG 24 hr tablet Take 2 tablets (1,000 mg total) by mouth 2 (two) times daily with a meal.   ondansetron (ZOFRAN-ODT) 4 MG disintegrating tablet Take 4 mg by mouth every 8 (eight) hours as needed.   OZEMPIC, 1 MG/DOSE, 4 MG/3ML SOPN Inject 1 mg into the skin once a week.   pantoprazole (PROTONIX) 40 MG tablet  TAKE 1 TABLET BY MOUTH DAILY   polyethylene glycol (MIRALAX / GLYCOLAX) 17 g packet Take 17 g by mouth daily as needed for mild constipation.   pregabalin (LYRICA) 75 MG capsule Take 75 mg by mouth 3 (three) times daily.   rosuvastatin (CRESTOR) 10 MG tablet Take 1 tablet (10 mg total) by mouth daily.   SUMAtriptan (IMITREX) 100 MG tablet Take 100 mg by mouth daily as needed for migraine or headache.   traZODone (DESYREL) 50 MG tablet Take 50 mg by mouth at bedtime.   furosemide (LASIX) 20 MG tablet Take 1 tablet (20 mg total)  by mouth daily.     Review of Systems    All other systems reviewed and are otherwise negative except as noted above.  Physical Exam    VS:  BP 100/70   Pulse 67   Ht 5' 4.5" (1.638 m)   Wt 173 lb 9.6 oz (78.7 kg)   LMP 11/25/2019   SpO2 96%   BMI 29.34 kg/m  , BMI Body mass index is 29.34 kg/m.  Wt Readings from Last 3 Encounters:  05/31/23 173 lb 9.6 oz (78.7 kg)  03/13/23 160 lb (72.6 kg)  09/25/22 177 lb (80.3 kg)     GEN: Well nourished, well developed, in no acute distress. HEENT: normal. Neck: Supple, no JVD, carotid bruits, or masses. Cardiac: S1/S2, RRR, no murmurs, rubs, or gallops. No clubbing, cyanosis, edema.  Radials/PT 2+ and equal bilaterally.  Respiratory:  Respirations regular and unlabored, clear to auscultation bilaterally. GI: Soft, nontender, nondistended. MS: No deformity or atrophy. Skin: Warm and dry, no rash. Neuro:  Strength and sensation are intact. Psych: Normal affect.  Assessment & Plan    HFrecEF, NICM Stage C, NYHA class I. Limited Echo 06/2022 revealed EF 55-60%. Euvolemic and well compensated on exam. Continue bisoprolol and spironolactone (will provide refills today). Also will change Lasix and K+ supplement to daily PRN. Low sodium diet, fluid restriction <2L, and daily weights encouraged. Educated to contact our office for weight gain of 2 lbs overnight or 5 lbs in one week.  HLD, medication  management Elevated LDL 03/2023. Will increase Crestor to 10 mg daily and repeat FLP and LFT in 2 months per protocol. Heart healthy diet and regular cardiovascular exercise encouraged.   3. Chronic venous insufficiency, leg edema Notes intermittent leg edema, no leg edema on exam today. Recommended compression stockings, will write Rx for her. Heart healthy diet and regular cardiovascular exercise encouraged.   Disposition: Follow up in 1 year(s) with Christell Constant, MD or [facility].  Signed, Sharlene Dory, NP 05/31/2023, 3:03 PM [facility]

## 2023-05-31 NOTE — Patient Instructions (Addendum)
Medication Instructions:  Your physician has recommended you make the following change in your medication:  Lasix & potasium changed to daily as needed Crestor increased to 10 mg daily Knee high compression socks as needed All other medications continue taking as prescribed  Labwork: Lipid panel & liver function in 2 months at [facility]  Testing/Procedures: none  Follow-Up: Your physician recommends that you schedule a follow-up appointment in: 1 year with pcp cardiologist  Any Other Special Instructions Will Be Listed Below (If Applicable).  If you need a refill on your cardiac medications before your next appointment, please call your pharmacy.

## 2023-06-06 ENCOUNTER — Other Ambulatory Visit (HOSPITAL_COMMUNITY): Payer: Self-pay

## 2023-06-12 ENCOUNTER — Other Ambulatory Visit: Payer: Self-pay | Admitting: Nurse Practitioner

## 2023-06-13 ENCOUNTER — Telehealth: Payer: Self-pay | Admitting: Student

## 2023-06-13 MED ORDER — MEDICAL COMPRESSION STOCKINGS MISC: 1.0000 | Freq: Once | 0 refills | Status: DC | PRN

## 2023-06-13 NOTE — Telephone Encounter (Signed)
Sent in this Morning.

## 2023-06-13 NOTE — Telephone Encounter (Signed)
*  STAT* If patient is at the pharmacy, call can be transferred to refill team.   1. Which medications need to be refilled? (please list name of each medication and dose if known) compression hose-  does she need 15 to 20 or 20 to 30- knee hi or thigh high also needs a diagnosis code  2. Which pharmacy/location (including street and city if local pharmacy) is medication to be sent to?[address], [place], [place] Fax # [phone]  3. Do they need a 30 day or 90 day supply?

## 2023-08-13 ENCOUNTER — Other Ambulatory Visit: Payer: Self-pay

## 2023-08-13 ENCOUNTER — Emergency Department (HOSPITAL_COMMUNITY): Payer: Medicaid Other

## 2023-08-13 ENCOUNTER — Encounter (HOSPITAL_COMMUNITY): Payer: Self-pay

## 2023-08-13 ENCOUNTER — Emergency Department (HOSPITAL_COMMUNITY)
Admission: EM | Admit: 2023-08-13 | Discharge: 2023-08-13 | Disposition: A | Discharge status: Home / Self Care | Payer: Medicaid Other | Attending: Emergency Medicine | Admitting: Emergency Medicine

## 2023-08-13 DIAGNOSIS — Z20822 Contact with and (suspected) exposure to covid-19: Secondary | ICD-10-CM | POA: Insufficient documentation

## 2023-08-13 DIAGNOSIS — H6692 Otitis media, unspecified, left ear: Secondary | ICD-10-CM | POA: Insufficient documentation

## 2023-08-13 DIAGNOSIS — R197 Diarrhea, unspecified: Secondary | ICD-10-CM | POA: Insufficient documentation

## 2023-08-13 DIAGNOSIS — Z794 Long term (current) use of insulin: Secondary | ICD-10-CM | POA: Diagnosis not present

## 2023-08-13 DIAGNOSIS — R1310 Dysphagia, unspecified: Secondary | ICD-10-CM | POA: Insufficient documentation

## 2023-08-13 LAB — SARS CORONAVIRUS 2 BY RT PCR: SARS Coronavirus 2 by RT PCR: NEGATIVE

## 2023-08-13 MED ORDER — FLUCONAZOLE 200 MG PO TABS: 200.0000 mg | ORAL_TABLET | Freq: Every day | ORAL | 0 refills | Status: AC

## 2023-08-13 MED ORDER — AMOXICILLIN 500 MG PO CAPS: 500.0000 mg | ORAL_CAPSULE | Freq: Three times a day (TID) | ORAL | 0 refills | Status: DC

## 2023-08-13 NOTE — Discharge Instructions (Addendum)
Take the antibiotic as directed until it is finished.  Tylenol if needed for fever or pain.  Please follow-up with your GI provider for recheck

## 2023-08-13 NOTE — ED Triage Notes (Signed)
Pt reports  Choked on bread Happened about an hour ago Concerned that she aspirated something Husband did the heimlich Ear pain Started yesterday Difficult swallowing Over the last few days

## 2023-08-17 NOTE — ED Provider Notes (Signed)
[place] EMERGENCY DEPARTMENT AT [facility] Provider Note   CSN: [id number] Arrival date & time: 08/13/23  1703     History  Chief Complaint  Patient presents with   Choking   Dysphagia   Otalgia    Kelli [name] is a 52 y.o. female.   Otalgia Associated symptoms: diarrhea   Associated symptoms: no abdominal pain, no congestion, no fever, no headaches and no vomiting         Kelli [name] is a 52 y.o. female who presents to the Emergency Department complaining of episode of choking approximately 1 hour prior to arrival.  States that she was eating bread and became choked.  Spouse performed the Heimlich maneuver on her and she spit out the bread.  Concerned if she may have aspirated.  She also complains of ear pain for several days.  History of difficulty swallowing.  Denies any chest pain or shortness of breath nausea or vomiting.     Home Medications Prior to Admission medications   Medication Sig Start Date End Date Taking? Authorizing Provider  amoxicillin (AMOXIL) 500 MG capsule Take 1 capsule (500 mg total) by mouth 3 (three) times daily. 08/13/23  Yes Rayola Everhart, PA-C  acetaminophen (TYLENOL) 325 MG tablet Take 650 mg by mouth as needed.    [provider]  albuterol (VENTOLIN HFA) 108 (90 Base) MCG/ACT inhaler Inhale 1 puff into the lungs as needed for wheezing or shortness of breath. 01/30/20   [provider]  bisoprolol (ZEBETA) 5 MG tablet TAKE 1/2 TABLET BY MOUTH DAILY - please schedule APPOINTMENT FOR more refills 05/31/23   Sharlene Dory, NP  blood glucose meter kit and supplies Dispense based on patient and insurance preference. Use up to four times daily as directed. (FOR ICD-10 E10.9, E11.9). 05/04/22   Johnson, Clanford L, MD  buPROPion (WELLBUTRIN XL) 300 MG 24 hr tablet Take 300 mg by mouth daily. 09/08/22   [provider]  busPIRone (BUSPAR) 10 MG tablet Take 10 mg by mouth 2 (two) times daily. 09/20/22    [provider]  Cholecalciferol (VITAMIN D3) 25 MCG (1000 UT) CAPS Take 1 capsule by mouth daily.    [provider]  cyanocobalamin (VITAMIN B12) 1000 MCG tablet Take 1,000 mcg by mouth daily.    [provider]  Elastic Bandages & Supports (MEDICAL COMPRESSION STOCKINGS) MISC 1 each by Does not apply route Once PRN for up to 1 dose. 06/13/23   Sharlene Dory, NP  escitalopram (LEXAPRO) 20 MG tablet Take 20 mg by mouth daily. 03/23/22   [provider]  furosemide (LASIX) 20 MG tablet Daily as needed 05/31/23   Sharlene Dory, NP  HYDROcodone-acetaminophen (NORCO) 7.5-325 MG tablet Take 1 tablet by mouth 2 (two) times daily. 01/15/23   [provider]  Insulin Pen Needle 31G X 5 MM MISC 1 Device by Does not apply route as directed. 05/04/22   Johnson, Clanford L, MD  magnesium oxide (MAG-OX) 400 (240 Mg) MG tablet Take 400 mg by mouth daily.    [provider]  metFORMIN (GLUCOPHAGE-XR) 500 MG 24 hr tablet Take 2 tablets (1,000 mg total) by mouth 2 (two) times daily with a meal. 05/04/22   Johnson, Clanford L, MD  ondansetron (ZOFRAN-ODT) 4 MG disintegrating tablet Take 4 mg by mouth every 8 (eight) hours as needed. 09/20/22   [provider]  OZEMPIC, 1 MG/DOSE, 4 MG/3ML SOPN Inject 1 mg into the skin once a week. 02/02/23  [provider]  pantoprazole (PROTONIX) 40 MG tablet TAKE 1 TABLET BY MOUTH DAILY 10/30/22   [place] Ouch, [place] M, PA-C  polyethylene glycol (MIRALAX / GLYCOLAX) 17 g packet Take 17 g by mouth daily as needed for mild constipation. 05/04/22   Cleora Fleet, MD  potassium chloride (KLOR-CON M) 10 MEQ tablet Daily as needed 05/31/23   Sharlene Dory, NP  pregabalin (LYRICA) 75 MG capsule Take 75 mg by mouth 3 (three) times daily. 03/05/23   [provider]  rosuvastatin (CRESTOR) 10 MG tablet Take 1 tablet (10 mg total) by mouth daily. 05/31/23 08/29/23  Sharlene Dory, NP  spironolactone (ALDACTONE)  25 MG tablet Take 0.5 tablets (12.5 mg total) by mouth daily. 05/31/23   Sharlene Dory, NP  SUMAtriptan (IMITREX) 100 MG tablet Take 100 mg by mouth daily as needed for migraine or headache. 08/04/22   [provider]  traZODone (DESYREL) 50 MG tablet Take 50 mg by mouth at bedtime. 09/08/22   [provider]      Allergies    Aspirin and Neosporin [neomycin-bacitracin zn-polymyx]    Review of Systems   Review of Systems  Constitutional:  Negative for chills and fever.  HENT:  Positive for ear pain. Negative for congestion.   Respiratory:  Positive for choking. Negative for chest tightness and shortness of breath.   Cardiovascular:  Negative for chest pain.  Gastrointestinal:  Positive for diarrhea. Negative for abdominal pain and vomiting.  Neurological:  Negative for dizziness, weakness and headaches.    Physical Exam Updated Vital Signs BP 109/86   Pulse 66   Temp 98 F (36.7 C)   Resp 18   Ht 5\' 4"  (1.626 m)   Wt 76.2 kg   LMP 11/25/2019   SpO2 95%   BMI 28.84 kg/m  Physical Exam Vitals and nursing note reviewed.  Constitutional:      General: She is not in acute distress.    Appearance: Normal appearance. She is not ill-appearing.  HENT:     Right Ear: Tympanic membrane and ear canal normal.     Left Ear: Ear canal normal.     Ears:     Comments: Erythema left TM no bulging or perforation    Mouth/Throat:     Mouth: Mucous membranes are moist.     Pharynx: Oropharynx is clear. No oropharyngeal exudate or posterior oropharyngeal erythema.  Cardiovascular:     Rate and Rhythm: Normal rate and regular rhythm.     Pulses: Normal pulses.  Pulmonary:     Effort: Pulmonary effort is normal.  Abdominal:     Palpations: Abdomen is soft.     Tenderness: There is no abdominal tenderness.  Musculoskeletal:        General: Normal range of motion.     Cervical back: Normal range of motion.     Right lower leg: No edema.     Left lower leg: No edema.   Lymphadenopathy:     Cervical: No cervical adenopathy.  Skin:    General: Skin is warm.  Neurological:     General: No focal deficit present.     Mental Status: She is alert.     Sensory: No sensory deficit.     Motor: No weakness.     ED Results / Procedures / Treatments   Labs (all labs ordered are listed, but only abnormal results are displayed) Labs Reviewed  SARS CORONAVIRUS 2 BY RT PCR    EKG None  Radiology No  results found.  Procedures Procedures    Medications Ordered in ED Medications - No data to display  ED Course/ Medical Decision Making/ A&P                                 Medical Decision Making Patient here for evaluation due to concern for possible aspiration.  Had a choking episode at home in which her spouse performed Heimlich maneuver and she spit out a piece of bread.  Also requesting evaluation of left ear pain.  No fever or chills no chest pain or shortness of breath.  Amount and/or Complexity of Data Reviewed Labs: ordered.    Details: COVID test negative. Radiology: ordered.    Details: Chest x-ray reassuring. Discussion of management or test interpretation with external provider(s): Patient observed in the department without complication.  No difficulty swallowing or breathing.  Lung sounds are clear to auscultation.  No hypoxia tachycardia or tachypnea.  She does have a left otitis media that I will treat.  Patient requested prescription for Diflucan as she notes prior yeast infections from antibiotics.  History of dysphagia with prior endoscopy, will follow-up with her GI provider as she may need repeat EGD  Risk Prescription drug management.           Final Clinical Impression(s) / ED Diagnoses Final diagnoses:  Dysphagia, unspecified type  Left otitis media, unspecified otitis media type    Rx / DC Orders ED Discharge Orders          Ordered    amoxicillin (AMOXIL) 500 MG capsule  3 times daily        08/13/23 2117     fluconazole (DIFLUCAN) 200 MG tablet  Daily        08/13/23 2117              Pauline Aus, PA-C 08/17/23 1614    Vanetta Mulders, MD 08/28/23 [phone]

## 2024-01-21 ENCOUNTER — Encounter (HOSPITAL_BASED_OUTPATIENT_CLINIC_OR_DEPARTMENT_OTHER): Payer: Self-pay | Admitting: Orthopaedic Surgery

## 2024-01-21 ENCOUNTER — Encounter: Payer: Self-pay | Admitting: Orthopaedic Surgery

## 2024-01-21 ENCOUNTER — Other Ambulatory Visit: Payer: Self-pay

## 2024-01-21 ENCOUNTER — Other Ambulatory Visit: Payer: Self-pay | Admitting: Orthopaedic Surgery

## 2024-01-21 DIAGNOSIS — S8261XA Displaced fracture of lateral malleolus of right fibula, initial encounter for closed fracture: Secondary | ICD-10-CM

## 2024-01-22 ENCOUNTER — Encounter (HOSPITAL_BASED_OUTPATIENT_CLINIC_OR_DEPARTMENT_OTHER): Payer: Self-pay | Admitting: Orthopaedic Surgery

## 2024-01-22 ENCOUNTER — Ambulatory Visit
Admission: RE | Admit: 2024-01-22 | Discharge: 2024-01-22 | Discharge status: Home / Self Care | Payer: No Typology Code available for payment source | Source: Ambulatory Visit | Attending: Orthopaedic Surgery

## 2024-01-22 ENCOUNTER — Ambulatory Visit (HOSPITAL_COMMUNITY): Payer: No Typology Code available for payment source

## 2024-01-22 ENCOUNTER — Encounter (HOSPITAL_BASED_OUTPATIENT_CLINIC_OR_DEPARTMENT_OTHER)
Admission: RE | Disposition: A | Discharge status: Home / Self Care | Payer: Self-pay | Source: Home / Self Care | Attending: Orthopaedic Surgery

## 2024-01-22 ENCOUNTER — Ambulatory Visit (HOSPITAL_BASED_OUTPATIENT_CLINIC_OR_DEPARTMENT_OTHER): Payer: No Typology Code available for payment source | Admitting: Anesthesiology

## 2024-01-22 ENCOUNTER — Ambulatory Visit (HOSPITAL_BASED_OUTPATIENT_CLINIC_OR_DEPARTMENT_OTHER)
Admission: RE | Admit: 2024-01-22 | Discharge: 2024-01-22 | Disposition: A | Discharge status: Home / Self Care | Payer: No Typology Code available for payment source | Attending: Orthopaedic Surgery | Admitting: Orthopaedic Surgery

## 2024-01-22 ENCOUNTER — Other Ambulatory Visit: Payer: Self-pay

## 2024-01-22 DIAGNOSIS — K219 Gastro-esophageal reflux disease without esophagitis: Secondary | ICD-10-CM | POA: Insufficient documentation

## 2024-01-22 DIAGNOSIS — X58XXXA Exposure to other specified factors, initial encounter: Secondary | ICD-10-CM | POA: Insufficient documentation

## 2024-01-22 DIAGNOSIS — E119 Type 2 diabetes mellitus without complications: Secondary | ICD-10-CM | POA: Insufficient documentation

## 2024-01-22 DIAGNOSIS — I509 Heart failure, unspecified: Secondary | ICD-10-CM | POA: Insufficient documentation

## 2024-01-22 DIAGNOSIS — I110 Hypertensive heart disease with heart failure: Secondary | ICD-10-CM | POA: Insufficient documentation

## 2024-01-22 DIAGNOSIS — R011 Cardiac murmur, unspecified: Secondary | ICD-10-CM | POA: Diagnosis not present

## 2024-01-22 DIAGNOSIS — S82851A Displaced trimalleolar fracture of right lower leg, initial encounter for closed fracture: Secondary | ICD-10-CM

## 2024-01-22 DIAGNOSIS — S8261XA Displaced fracture of lateral malleolus of right fibula, initial encounter for closed fracture: Secondary | ICD-10-CM

## 2024-01-22 DIAGNOSIS — E1165 Type 2 diabetes mellitus with hyperglycemia: Secondary | ICD-10-CM

## 2024-01-22 DIAGNOSIS — S93431A Sprain of tibiofibular ligament of right ankle, initial encounter: Secondary | ICD-10-CM | POA: Diagnosis not present

## 2024-01-22 DIAGNOSIS — Z87891 Personal history of nicotine dependence: Secondary | ICD-10-CM | POA: Diagnosis not present

## 2024-01-22 HISTORY — DX: Heart failure, unspecified: I50.9

## 2024-01-22 HISTORY — PX: SYNDESMOSIS REPAIR: SHX5182

## 2024-01-22 HISTORY — PX: ORIF ANKLE FRACTURE: SHX5408

## 2024-01-22 LAB — GLUCOSE, CAPILLARY
Glucose-Capillary: 226 mg/dL — ABNORMAL HIGH (ref 70–99)
Glucose-Capillary: 248 mg/dL — ABNORMAL HIGH (ref 70–99)
Glucose-Capillary: 263 mg/dL — ABNORMAL HIGH (ref 70–99)

## 2024-01-22 SURGERY — OPEN REDUCTION INTERNAL FIXATION (ORIF) ANKLE FRACTURE
Anesthesia: General | Site: Ankle | Laterality: Right

## 2024-01-22 MED ORDER — OXYCODONE HCL 5 MG/5ML PO SOLN: 5.0000 mg | Freq: Once | ORAL | Status: DC | PRN

## 2024-01-22 MED ORDER — CEFAZOLIN SODIUM-DEXTROSE 2-4 GM/100ML-% IV SOLN
INTRAVENOUS | Status: AC
Start: 2024-01-22 — End: ?
  Filled 2024-01-22: qty 100

## 2024-01-22 MED ORDER — FENTANYL CITRATE (PF) 100 MCG/2ML IJ SOLN
INTRAMUSCULAR | Status: AC
  Filled 2024-01-22: qty 2

## 2024-01-22 MED ORDER — CEFAZOLIN SODIUM-DEXTROSE 2-4 GM/100ML-% IV SOLN
2.0000 g | INTRAVENOUS | Status: AC
  Administered 2024-01-22: 2 g via INTRAVENOUS

## 2024-01-22 MED ORDER — DEXAMETHASONE SODIUM PHOSPHATE 10 MG/ML IJ SOLN
INTRAMUSCULAR | Status: AC
  Filled 2024-01-22: qty 1

## 2024-01-22 MED ORDER — INSULIN ASPART 100 UNIT/ML IJ SOLN: 4.0000 [IU] | Freq: Once | INTRAMUSCULAR | Status: DC

## 2024-01-22 MED ORDER — POVIDONE-IODINE 10 % EX SOLN
CUTANEOUS | Status: DC | PRN
  Administered 2024-01-22: 1 via TOPICAL

## 2024-01-22 MED ORDER — VANCOMYCIN HCL 500 MG IV SOLR
INTRAVENOUS | Status: DC | PRN
  Administered 2024-01-22: 500 mg via TOPICAL

## 2024-01-22 MED ORDER — 0.9 % SODIUM CHLORIDE (POUR BTL) OPTIME
TOPICAL | Status: DC | PRN
  Administered 2024-01-22: 1000 mL

## 2024-01-22 MED ORDER — CHLORHEXIDINE GLUCONATE 4 % EX SOLN: 60.0000 mL | Freq: Once | CUTANEOUS | Status: DC

## 2024-01-22 MED ORDER — LACTATED RINGERS IV SOLN: INTRAVENOUS | Status: DC

## 2024-01-22 MED ORDER — PROPOFOL 10 MG/ML IV BOLUS
INTRAVENOUS | Status: DC | PRN
  Administered 2024-01-22: 150 mg via INTRAVENOUS

## 2024-01-22 MED ORDER — GLYCOPYRROLATE PF 0.2 MG/ML IJ SOSY
PREFILLED_SYRINGE | INTRAMUSCULAR | Status: AC
  Filled 2024-01-22: qty 1

## 2024-01-22 MED ORDER — OXYCODONE HCL 5 MG PO TABS: 5.0000 mg | ORAL_TABLET | Freq: Once | ORAL | Status: DC | PRN

## 2024-01-22 MED ORDER — ACETAMINOPHEN 10 MG/ML IV SOLN: 1000.0000 mg | Freq: Once | INTRAVENOUS | Status: DC | PRN

## 2024-01-22 MED ORDER — ONDANSETRON HCL 4 MG/2ML IJ SOLN
INTRAMUSCULAR | Status: DC | PRN
  Administered 2024-01-22: 4 mg via INTRAVENOUS

## 2024-01-22 MED ORDER — MIDAZOLAM HCL 2 MG/2ML IJ SOLN
2.0000 mg | Freq: Once | INTRAMUSCULAR | Status: AC
  Administered 2024-01-22: 2 mg via INTRAVENOUS

## 2024-01-22 MED ORDER — DEXAMETHASONE SODIUM PHOSPHATE 10 MG/ML IJ SOLN
INTRAMUSCULAR | Status: DC | PRN
  Administered 2024-01-22: 10 mg

## 2024-01-22 MED ORDER — FENTANYL CITRATE (PF) 100 MCG/2ML IJ SOLN
100.0000 ug | Freq: Once | INTRAMUSCULAR | Status: AC
  Administered 2024-01-22: 50 ug via INTRAVENOUS

## 2024-01-22 MED ORDER — KETOROLAC TROMETHAMINE 30 MG/ML IJ SOLN
INTRAMUSCULAR | Status: AC
  Filled 2024-01-22: qty 1

## 2024-01-22 MED ORDER — EPHEDRINE SULFATE-NACL 50-0.9 MG/10ML-% IV SOSY
PREFILLED_SYRINGE | INTRAVENOUS | Status: DC | PRN
  Administered 2024-01-22: 5 mg via INTRAVENOUS
  Administered 2024-01-22: 10 mg via INTRAVENOUS

## 2024-01-22 MED ORDER — FENTANYL CITRATE (PF) 100 MCG/2ML IJ SOLN: 25.0000 ug | INTRAMUSCULAR | Status: DC | PRN

## 2024-01-22 MED ORDER — LIDOCAINE 2% (20 MG/ML) 5 ML SYRINGE
INTRAMUSCULAR | Status: AC
  Filled 2024-01-22: qty 5

## 2024-01-22 MED ORDER — ONDANSETRON HCL 4 MG/2ML IJ SOLN
INTRAMUSCULAR | Status: AC
  Filled 2024-01-22: qty 2

## 2024-01-22 MED ORDER — ROPIVACAINE HCL 5 MG/ML IJ SOLN
INTRAMUSCULAR | Status: DC | PRN
Start: 2024-01-22 — End: 2024-01-22
  Administered 2024-01-22: 30 mL via PERINEURAL

## 2024-01-22 MED ORDER — MIDAZOLAM HCL 2 MG/2ML IJ SOLN
INTRAMUSCULAR | Status: AC
  Filled 2024-01-22: qty 2

## 2024-01-22 MED ORDER — CLONIDINE HCL (ANALGESIA) 100 MCG/ML EP SOLN
EPIDURAL | Status: DC | PRN
Start: 2024-01-22 — End: 2024-01-22
  Administered 2024-01-22: 100 ug

## 2024-01-22 MED ORDER — FENTANYL CITRATE (PF) 100 MCG/2ML IJ SOLN
INTRAMUSCULAR | Status: DC | PRN
Start: 2024-01-22 — End: 2024-01-22
  Administered 2024-01-22 (×2): 50 ug via INTRAVENOUS

## 2024-01-22 MED ORDER — ONDANSETRON HCL 4 MG/2ML IJ SOLN: 4.0000 mg | Freq: Once | INTRAMUSCULAR | Status: DC | PRN

## 2024-01-22 SURGICAL SUPPLY — 65 items
BANDAGE ESMARK 6X9 LF (GAUZE/BANDAGES/DRESSINGS) IMPLANT
BIT DRILL 2.5X2.75 QC CALB (BIT) IMPLANT
BIT DRILL 2.9 CANN QC NONSTRL (BIT) IMPLANT
BIT DRILL 2.9X70 QC CALB (BIT) IMPLANT
BLADE SURG 15 STRL LF DISP TIS (BLADE) ×12 IMPLANT
BNDG COHESIVE 4X5 TAN STRL LF (GAUZE/BANDAGES/DRESSINGS) ×3 IMPLANT
BNDG ELASTIC 6X10 VLCR STRL LF (GAUZE/BANDAGES/DRESSINGS) ×3 IMPLANT
BNDG ESMARK 6X9 LF (GAUZE/BANDAGES/DRESSINGS)
BNDG GAUZE DERMACEA FLUFF 4 (GAUZE/BANDAGES/DRESSINGS) ×3 IMPLANT
BRUSH SCRUB EZ 4% CHG (MISCELLANEOUS) ×3 IMPLANT
CANISTER SUCT 1200ML W/VALVE (MISCELLANEOUS) ×3 IMPLANT
CHLORAPREP W/TINT 26 (MISCELLANEOUS) ×3 IMPLANT
COVER BACK TABLE 60X90IN (DRAPES) ×3 IMPLANT
CUFF TRNQT CYL 34X4.125X (TOURNIQUET CUFF) ×3 IMPLANT
DRAPE C-ARM 42X72 X-RAY (DRAPES) ×3 IMPLANT
DRAPE C-ARMOR (DRAPES) ×3 IMPLANT
DRAPE EXTREMITY T 121X128X90 (DISPOSABLE) ×3 IMPLANT
DRAPE IMP U-DRAPE 54X76 (DRAPES) ×3 IMPLANT
DRAPE U-SHAPE 47X51 STRL (DRAPES) ×3 IMPLANT
DRSG MEPITEL 4X7.2 (GAUZE/BANDAGES/DRESSINGS) ×3 IMPLANT
ELECT REM PT RETURN 9FT ADLT (ELECTROSURGICAL) ×2
ELECTRODE REM PT RTRN 9FT ADLT (ELECTROSURGICAL) ×3 IMPLANT
GAUZE PAD ABD 8X10 STRL (GAUZE/BANDAGES/DRESSINGS) ×9 IMPLANT
GAUZE SPONGE 4X4 12PLY STRL (GAUZE/BANDAGES/DRESSINGS) ×3 IMPLANT
GLOVE BIOGEL PI IND STRL 8 (GLOVE) ×3 IMPLANT
GLOVE SURG SS PI 7.5 STRL IVOR (GLOVE) ×6 IMPLANT
GOWN STRL REUS W/ TWL LRG LVL3 (GOWN DISPOSABLE) ×6 IMPLANT
K-WIRE ACE 1.6X6 (WIRE) ×8
KWIRE ACE 1.6X6 (WIRE) IMPLANT
MARKER SKIN DUAL TIP RULER LAB (MISCELLANEOUS) IMPLANT
NDL HYPO 25X1 1.5 SAFETY (NEEDLE) IMPLANT
NEEDLE HYPO 25X1 1.5 SAFETY (NEEDLE) IMPLANT
NS IRRIG 1000ML POUR BTL (IV SOLUTION) ×3 IMPLANT
PACK BASIN DAY SURGERY FS (CUSTOM PROCEDURE TRAY) ×3 IMPLANT
PAD CAST 4YDX4 CTTN HI CHSV (CAST SUPPLIES) ×3 IMPLANT
PADDING CAST ABS COTTON 4X4 ST (CAST SUPPLIES) IMPLANT
PADDING CAST SYNTHETIC 4X4 STR (CAST SUPPLIES) ×6 IMPLANT
PADDING CAST SYNTHETIC 6X4 NS (CAST SUPPLIES) ×6 IMPLANT
PENCIL SMOKE EVACUATOR (MISCELLANEOUS) ×3 IMPLANT
PLATE LOCK 7H 92 BILAT FIB (Plate) IMPLANT
SCREW ACE CAN 4.0 50M (Screw) IMPLANT
SCREW LOCK CORT STAR 3.5X12 (Screw) IMPLANT
SCREW LOCK CORT STAR 3.5X14 (Screw) IMPLANT
SCREW NLOCK CANC HEX 4X60 (Screw) IMPLANT
SCREW NON LOCKING LP 3.5 14MM (Screw) IMPLANT
SHEET MEDIUM DRAPE 40X70 STRL (DRAPES) ×3 IMPLANT
SLEEVE SCD COMPRESS KNEE MED (STOCKING) ×3 IMPLANT
SPIKE FLUID TRANSFER (MISCELLANEOUS) IMPLANT
SPONGE T-LAP 18X18 ~~LOC~~+RFID (SPONGE) ×3 IMPLANT
STAPLER SKIN PROX WIDE 3.9 (STAPLE) IMPLANT
STOCKINETTE 6 STRL (DRAPES) ×3 IMPLANT
STOCKINETTE ORTHO 6X25 (MISCELLANEOUS) ×3 IMPLANT
SUCTION TUBE FRAZIER 10FR DISP (SUCTIONS) IMPLANT
SUT ETHILON 2 0 FS 18 (SUTURE) ×6 IMPLANT
SUT MNCRL AB 3-0 PS2 18 (SUTURE) IMPLANT
SUT PDS 3-0 CT2 (SUTURE)
SUT PDS II 3-0 CT2 27 ABS (SUTURE) IMPLANT
SUT VIC AB 0 CT1 27XBRD ANBCTR (SUTURE) IMPLANT
SUT VIC AB 2-0 CT1 TAPERPNT 27 (SUTURE) ×3 IMPLANT
SUT VIC AB 3-0 SH 27X BRD (SUTURE) IMPLANT
SYR BULB IRRIG 60ML STRL (SYRINGE) ×3 IMPLANT
SYR CONTROL 10ML LL (SYRINGE) IMPLANT
TOWEL GREEN STERILE FF (TOWEL DISPOSABLE) ×6 IMPLANT
TUBE CONNECTING 20X1/4 (TUBING) ×3 IMPLANT
UNDERPAD 30X36 HEAVY ABSORB (UNDERPADS AND DIAPERS) ×3 IMPLANT

## 2024-01-22 NOTE — Op Note (Signed)
01/22/2024  2:44 PM   PATIENT: Kelli [name]  53 y.o. female  MRN: [id number]   PRE-OPERATIVE DIAGNOSIS:   Closed trimalleolar fracture of right ankle   POST-OPERATIVE DIAGNOSIS:   Same   PROCEDURE: 1] ORIF right ankle trimalleolar fracture without internal fixation of posterior malleolus 2] ORIF right ankle syndesmosis   SURGEON:  Netta Cedars, MD   ASSISTANT: None   ANESTHESIA: General, regional   EBL: Minimal   TOURNIQUET:    Total Tourniquet Time Documented: Thigh (Right) - 22 minutes Total: Thigh (Right) - 22 minutes    COMPLICATIONS: None apparent   DISPOSITION: Extubated, awake and stable to recovery.   INDICATION FOR PROCEDURE: The patient presented with above diagnosis.  We discussed the diagnosis, alternative treatment options, risks and benefits of the above surgical intervention, as well as alternative non-operative treatments. All questions/concerns were addressed and the patient/family demonstrated appropriate understanding of the diagnosis, the procedure, the postoperative course, and overall prognosis. The patient wished to proceed with surgical intervention and signed an informed surgical consent as such, in each others presence prior to surgery.   PROCEDURE IN DETAIL: After preoperative consent was obtained and the correct operative site was identified, the patient was brought to the operating room supine on stretcher and transferred onto operating table. General anesthesia was induced. Preoperative antibiotics were administered. Surgical timeout was taken. The patient was then positioned supine with an ipsilateral hip bump. The operative lower extremity was prepped and draped in standard sterile fashion with a tourniquet around the thigh. The extremity was exsanguinated and the tourniquet was inflated to 275 mmHg.  A standard lateral incision was made over the distal fibula. Dissection was carried down to the level of the fibula and  the fracture site identified. The superficial peroneal nerve was identified and protected throughout the procedure. The fibula was noted to be shortened with interposed periosteum. The fibula was brought out to length. The fibula fracture was debrided and the edges defined to achieve cortical read. Reduction maneuver was performed using pointed reduction forceps and lobster forceps. In this manner, the fibula length was restored and fracture reduced. A lag screw was not placed given the orientation of fracture lines and comminution. Due to poor bone quality and extensive comminution at the fracture site, it was decided to use a locking distal fibula plate. We then selected a Zimmer locking plate to match the anatomy of the distal fibula and placed it laterally. This was implanted under intraoperative fluoroscopy with a combination of distal locking screws and proximal cortical & locking screws.  We then turned to the medial malleolar fracture. After obtaining reduction under fluoroscopy, a Kirschner wire was placed to secure this reduction and to serve as guide for a cannulated screw. We then made an incision around the wire and overdrilled this with a cannulated drill. We then placed a 4.0 mm Zimmer [employer] partially threaded lag screw. This screw was noted to achieve excellent compression across the fracture site and also have excellent purchase. We verified position of the screw and fracture reduction in all planes with fluoroscopy.  A manual external rotation stress radiograph was obtained and demonstrated widening of the ankle mortise. Given this intraoperative finding as well as preoperative subluxation, it was decided to reduce and fix the syndesmosis. Therefore a nonlocking quadricortical hex head 4.0 mm screw was implanted through the fibula plate in appropriate fashion to fix the syndesmosis. Screw position was verified along anteromedial tibial cortex by fluoroscopy. A repeat stress radiograph showed  complete stability of the ankle mortise to testing.   The surgical sites were thoroughly irrigated. The tourniquet was deflated and hemostasis achieved. Betadine and vancomycin powder were applied. The deep layers were closed using 2-0 vicryl. The skin was closed without tension.    The leg was cleaned with saline and sterile dressings with gauze were applied. A well padded bulky short leg splint was applied. The patient was awakened from anesthesia and transported to the recovery room in stable condition.    FOLLOW UP PLAN: -transfer to PACU, then home -strict NWB operative extremity, maximum elevation -maintain short leg splint until follow up -DVT ppx: Eliquis 2.5 mg twice daily while NWB -follow up as outpatient within 7-10 days for wound check with exchange of short leg splint to short leg cast -sutures out in 2-3 weeks in outpatient office   RADIOGRAPHS: AP, lateral, oblique and stress radiographs of the right ankle were obtained intraoperatively. These showed interval reduction and fixation of the fractures. Manual stress radiographs were taken and the joints were noted to be stable following fixation. All hardware is appropriately positioned and of the appropriate lengths. No other acute injuries are noted.   Netta Cedars [facility]opa[facility]

## 2024-01-22 NOTE — Transfer of Care (Signed)
Immediate Anesthesia Transfer of Care Note  Patient: Kelli [name]  Procedure(s) Performed: OPEN REDUCTION INTERNAL FIXATION (ORIF) TRIMALEOLUS ANKLE FRACTURE (Right: Ankle) SYNDESMOSIS REPAIR (Right)  Patient Location: PACU  Anesthesia Type:GA combined with regional for post-op pain  Level of Consciousness: awake, alert , and oriented  Airway & Oxygen Therapy: Patient Spontanous Breathing and Patient connected to face mask oxygen  Post-op Assessment: Report given to RN and Post -op Vital signs reviewed and stable  Post vital signs: Reviewed and stable  Last Vitals:  Vitals Value Taken Time  BP 113/48 01/22/24 1324  Temp    Pulse 75 01/22/24 1328  Resp 14 01/22/24 1328  SpO2 96 % 01/22/24 1328  Vitals shown include unfiled device data.  Last Pain:  Vitals:   01/22/24 1100  TempSrc: Temporal  PainSc: 5       Patients Stated Pain Goal: 5 (01/22/24 1100)  Complications: No notable events documented.

## 2024-01-22 NOTE — Progress Notes (Signed)
Assisted Dr. Ace Gins with right, adductor canal, popliteal, ultrasound guided block. Side rails up, monitors on throughout procedure. See vital signs in flow sheet. Tolerated Procedure well.

## 2024-01-22 NOTE — Anesthesia Procedure Notes (Signed)
Anesthesia Regional Block: Popliteal block   Pre-Anesthetic Checklist: , timeout performed,  Correct Patient, Correct Site, Correct Laterality,  Correct Procedure, Correct Position, site marked,  Risks and benefits discussed,  Surgical consent,  Pre-op evaluation,  At surgeon's request and post-op pain management  Laterality: Right  Prep: Maximum Sterile Barrier Precautions used, chloraprep       Needles:  Injection technique: Single-shot  Needle Type: Echogenic Needle      Needle Gauge: 20     Additional Needles:   Procedures:,,,, ultrasound used (permanent image in chart),,    Narrative:  Start time: 01/22/2024 11:37 AM End time: 01/22/2024 11:39 AM Injection made incrementally with aspirations every 5 mL.  Performed by: Personally  Anesthesiologist: Mariann Barter, MD

## 2024-01-22 NOTE — Anesthesia Preprocedure Evaluation (Signed)
Anesthesia Evaluation  Patient identified by MRN, date of birth, ID band Patient awake    Reviewed: Allergy & Precautions, NPO status , Patient's Chart, lab work & pertinent test results, reviewed documented beta blocker date and time   History of Anesthesia Complications Negative for: history of anesthetic complications  Airway Mallampati: II  TM Distance: >3 FB     Dental no notable dental hx.    Pulmonary neg shortness of breath, neg sleep apnea, pneumonia, resolved, Patient abstained from smoking., former smoker, neg PE   breath sounds clear to auscultation       Cardiovascular hypertension, (-) angina +CHF  (-) CAD and (-) Past MI + Valvular Problems/Murmurs  Rhythm:Regular Rate:Normal     Neuro/Psych  Headaches, neg Seizures PSYCHIATRIC DISORDERS Anxiety Depression       GI/Hepatic ,GERD  Medicated and Controlled,,(+) neg Cirrhosis        Endo/Other  diabetes, Type 2    Renal/GU Renal disease     Musculoskeletal  (+) Arthritis ,    Abdominal   Peds  Hematology  (+) Blood dyscrasia, anemia   Anesthesia Other Findings   Reproductive/Obstetrics                              Anesthesia Physical Anesthesia Plan  ASA: 2  Anesthesia Plan: General   Post-op Pain Management: Regional block*   Induction: Intravenous  PONV Risk Score and Plan: 2 and Ondansetron and Dexamethasone  Airway Management Planned: LMA  Additional Equipment:   Intra-op Plan:   Post-operative Plan: Extubation in OR  Informed Consent: I have reviewed the patients History and Physical, chart, labs and discussed the procedure including the risks, benefits and alternatives for the proposed anesthesia with the patient or authorized representative who has indicated his/her understanding and acceptance.     Dental advisory given  Plan Discussed with: CRNA  Anesthesia Plan Comments:           Anesthesia Quick Evaluation

## 2024-01-22 NOTE — Anesthesia Postprocedure Evaluation (Signed)
Anesthesia Post Note  Patient: Kelli [name]  Procedure(s) Performed: OPEN REDUCTION INTERNAL FIXATION (ORIF) TRIMALEOLUS ANKLE FRACTURE (Right: Ankle) SYNDESMOSIS REPAIR (Right)     Patient location during evaluation: PACU Anesthesia Type: General Level of consciousness: awake and alert Pain management: pain level controlled Vital Signs Assessment: post-procedure vital signs reviewed and stable Respiratory status: spontaneous breathing, nonlabored ventilation, respiratory function stable and patient connected to nasal cannula oxygen Cardiovascular status: blood pressure returned to baseline and stable Postop Assessment: no apparent nausea or vomiting Anesthetic complications: no   No notable events documented.  Last Vitals:  Vitals:   01/22/24 1400 01/22/24 1420  BP: (!) 123/58 (!) 130/59  Pulse: 70 80  Resp: 14 20  Temp:  (!) 36.2 C  SpO2: 94% 96%    Last Pain:  Vitals:   01/22/24 1420  TempSrc: Temporal  PainSc: 0-No pain                 Mariann Barter

## 2024-01-22 NOTE — Discharge Instructions (Addendum)
Netta Cedars, MD [facility]  Please read the following information regarding your care after surgery.  Medications  You only need a prescription for the narcotic pain medicine (ex. oxycodone, Percocet, Norco).  All of the other medicines listed below are available over the counter. ? Aleve 2 pills twice a day for the first 3 days after surgery. ? acetominophen (Tylenol) 650 mg every 4-6 hours as you need for minor to moderate pain ? oxycodone as prescribed for severe pain  ? To help prevent blood clots, take eliquis (2.5 mg) twice daily for at least 35 days after surgery.  You should also get up every hour while you are awake to move around.  Weight Bearing ? Do NOT bear any weight on the operated leg or foot. This means do NOT touch your surgical leg to the ground!  Cast / Splint / Dressing ? If you have a splint, do NOT remove this. Keep your splint, cast or dressing clean and dry.  Don't put anything (coat hanger, pencil, etc) down inside of it.  If it gets wet, call the office immediately to schedule an appointment for a cast change.  Swelling IMPORTANT: It is normal for you to have swelling where you had surgery. To reduce swelling and pain, keep at least 3 pillows under your leg so that your toes are above your nose and your heel is above the level of your hip.  It may be necessary to keep your foot or leg elevated for several weeks.  This is critical to helping your incisions heal and your pain to feel better.  Follow Up Call my office at [phone] when you are discharged from the hospital or surgery center to schedule an appointment to be seen 7-10 days after surgery.  Call my office at [phone] if you develop a fever >101.5 F, nausea, vomiting, bleeding from the surgical site or severe pain.     Post Anesthesia Home Care Instructions  Activity: Get plenty of rest for the remainder of the day. A responsible individual must stay with you for 24 hours following the  procedure.  For the next 24 hours, DO NOT: -Drive a car -Advertising copywriter -Drink alcoholic beverages -Take any medication unless instructed by your physician -Make any legal decisions or sign important papers.  Meals: Start with liquid foods such as gelatin or soup. Progress to regular foods as tolerated. Avoid greasy, spicy, heavy foods. If nausea and/or vomiting occur, drink only clear liquids until the nausea and/or vomiting subsides. Call your physician if vomiting continues.  Special Instructions/Symptoms: Your throat may feel dry or sore from the anesthesia or the breathing tube placed in your throat during surgery. If this causes discomfort, gargle with warm salt water. The discomfort should disappear within 24 hours.  If you had a scopolamine patch placed behind your ear for the management of post- operative nausea and/or vomiting:  1. The medication in the patch is effective for 72 hours, after which it should be removed.  Wrap patch in a tissue and discard in the trash. Wash hands thoroughly with soap and water. 2. You may remove the patch earlier than 72 hours if you experience unpleasant side effects which may include dry mouth, dizziness or visual disturbances. 3. Avoid touching the patch. Wash your hands with soap and water after contact with the patch.    Regional Anesthesia Blocks  1. You may not be able to move or feel the "blocked" extremity after a regional anesthetic block. This may last  may last from 3-48 hours after placement, but it will go away. The length of time depends on the medication injected and your individual response to the medication. As the nerves start to wake up, you may experience tingling as the movement and feeling returns to your extremity. If the numbness and inability to move your extremity has not gone away after 48 hours, please call your surgeon.   2. The extremity that is blocked will need to be protected until the numbness is gone and the  strength has returned. Because you cannot feel it, you will need to take extra care to avoid injury. Because it may be weak, you may have difficulty moving it or using it. You may not know what position it is in without looking at it while the block is in effect.  3. For blocks in the legs and feet, returning to weight bearing and walking needs to be done carefully. You will need to wait until the numbness is entirely gone and the strength has returned. You should be able to move your leg and foot normally before you try and bear weight or walk. You will need someone to be with you when you first try to ensure you do not fall and possibly risk injury.  4. Bruising and tenderness at the needle site are common side effects and will resolve in a few days.  5. Persistent numbness or new problems with movement should be communicated to the surgeon or the [facility] [phone] [facility] [phone]).

## 2024-01-22 NOTE — Anesthesia Procedure Notes (Signed)
Anesthesia Regional Block: Adductor canal block   Pre-Anesthetic Checklist: , timeout performed,  Correct Patient, Correct Site, Correct Laterality,  Correct Procedure, Correct Position, site marked,  Risks and benefits discussed,  Surgical consent,  Pre-op evaluation,  At surgeon's request and post-op pain management  Laterality: Right  Prep: Maximum Sterile Barrier Precautions used, chloraprep       Needles:  Injection technique: Single-shot  Needle Type: Echogenic Needle      Needle Gauge: 20     Additional Needles:   Procedures:,,,, ultrasound used (permanent image in chart),,    Narrative:  Start time: 01/22/2024 11:39 AM End time: 01/22/2024 11:41 AM Injection made incrementally with aspirations every 5 mL.  Performed by: Personally  Anesthesiologist: Mariann Barter, MD

## 2024-01-22 NOTE — H&P (Signed)
ORTHOPAEDIC SURGERY H&P  Subjective:  The patient presents with right ankle fx.   Past Medical History:  Diagnosis Date   Allergy    Anemia    Arthritis    knee   CHF (congestive heart failure) (HCC)    Chronic headache    Colon polyps 03/28/2011   Depression    Diabetes mellitus    Diverticulosis    GAD (generalized anxiety disorder)    Gallstones    GERD (gastroesophageal reflux disease)    Heart murmur    Hyperlipidemia    Migraines    Obesity     Past Surgical History:  Procedure Laterality Date   BIOPSY  05/04/2022   Procedure: BIOPSY;  Surgeon: Corbin Ade, MD;  Location: [facility];  Service: Endoscopy;;   CESAREAN SECTION  1991, 1995   CHOLECYSTECTOMY  1992   COLONOSCOPY     ESOPHAGEAL DILATION N/A 05/04/2022   Procedure: ESOPHAGEAL DILATION;  Surgeon: Corbin Ade, MD;  Location: [facility];  Service: Endoscopy;  Laterality: N/A;   ESOPHAGOGASTRODUODENOSCOPY (EGD) WITH PROPOFOL N/A 05/04/2022   Procedure: ESOPHAGOGASTRODUODENOSCOPY (EGD) WITH PROPOFOL;  Surgeon: Corbin Ade, MD;  Location: [facility];  Service: Endoscopy;  Laterality: N/A;   POLYPECTOMY     RHINOPLASTY  1993   TUBAL LIGATION Bilateral 1995     (Not in an outpatient encounter)    Allergies  Allergen Reactions   Aspirin     Vomiting and nausea   Neosporin [Neomycin-Bacitracin Zn-Polymyx] Rash    "blisters"    Social History   Socioeconomic History   Marital status: Married    Spouse name: Not on file   Number of children: 2   Years of education: Not on file   Highest education level: Not on file  Occupational History   Occupation: customer service rep  Tobacco Use   Smoking status: Former    Current packs/day: 0.50    Average packs/day: 0.5 packs/day for 1 year (0.5 ttl pk-yrs)    Types: Cigarettes   Smokeless tobacco: Never  Vaping Use   Vaping status: Every Day  Substance and Sexual Activity   Alcohol use: Not Currently    Comment: occasionally    Drug use: No   Sexual activity: Yes    Partners: Male    Birth control/protection: Surgical, Condom    Comment: BTL  Other Topics Concern   Not on file  Social History Narrative   Not on file   Social Drivers of Health   Financial Resource Strain: Not on file  Food Insecurity: Low Risk  (09/13/2023)   Received from [facility]   Hunger Vital Sign    Worried About Running Out of Food in the Last Year: Never true    Ran Out of Food in the Last Year: Never true  Transportation Needs: No Transportation Needs (09/13/2023)   Received from [facility]    In the past 12 months, has lack of reliable transportation kept you from medical appointments, meetings, work or from getting things needed for daily living? : No  Physical Activity: Not on file  Stress: Not on file  Social Connections: Not on file  Intimate Partner Violence: Not on file     History reviewed. No pertinent family history.   Review of Systems Pertinent items are noted in HPI.  Objective: Vital signs in last 24 hours:    01/22/2024   11:00 AM 01/21/2024    4:00 PM 08/13/2023  9:00 PM  Vitals with BMI  Height 5\' 4"  5\' 4"    Weight 211 lbs 3 oz 215 lbs   BMI 36.23 36.89   Systolic 135  109  Diastolic 64  86  Pulse 68  66      EXAM: General: Well nourished, well developed. Awake, alert and oriented to time, place, person. Normal mood and affect. No apparent distress. Breathing room air.  Operative Lower Extremity: Alignment - Neutral Deformity - None Skin intact Tenderness to palpation - right ankle 5/5 TA, PT, GS, Per, EHL, FHL Sensation intact to light touch throughout Palpable DP and PT pulses Special testing: None  The contralateral foot/ankle was examined for comparison and noted to be neurovascularly intact with no localized deformity, swelling, or tenderness.  Imaging Review All images taken were independently reviewed by me.  Assessment/Plan: The clinical and  radiographic findings were reviewed and discussed at length with the patient.  The patient has right ankle fx.  We spoke at length about the natural course of these findings. We discussed nonoperative and operative treatment options in detail.  The risks and benefits were presented and reviewed. The risks due to hardware failure/irritation, new/persistent/recurrent infection, stiffness, nerve/vessel/tendon injury, nonunion/malunion of any fracture, wound healing issues, allograft usage, development of arthritis, failure of this surgery, possibility of external fixation in certain situations, possibility of delayed definitive surgery, need for further surgery, prolonged wound care including further soft tissue coverage procedures, thromboembolic events, anesthesia/medical complications/events perioperatively and beyond, amputation, death among others were discussed. The patient acknowledged the explanation and agreed to proceed with the plan.  Kelli [name]  Orthopaedic Surgery [facility]

## 2024-01-22 NOTE — Anesthesia Procedure Notes (Signed)
Procedure Name: LMA Insertion Date/Time: 01/22/2024 12:35 PM  Performed by: Yolanda Bonine, CRNAPre-anesthesia Checklist: Patient identified, Emergency Drugs available, Suction available, Patient being monitored and Timeout performed Patient Re-evaluated:Patient Re-evaluated prior to induction Oxygen Delivery Method: Circle system utilized Preoxygenation: Pre-oxygenation with 100% oxygen Induction Type: IV induction Ventilation: Mask ventilation without difficulty LMA: LMA inserted LMA Size: 4.0 Number of attempts: 1 Placement Confirmation: positive ETCO2 and breath sounds checked- equal and bilateral Dental Injury: Teeth and Oropharynx as per pre-operative assessment

## 2024-01-23 ENCOUNTER — Encounter (HOSPITAL_BASED_OUTPATIENT_CLINIC_OR_DEPARTMENT_OTHER): Payer: Self-pay | Admitting: Orthopaedic Surgery

## 2024-04-10 ENCOUNTER — Telehealth: Payer: Self-pay | Admitting: Nurse Practitioner

## 2024-04-10 DIAGNOSIS — E876 Hypokalemia: Secondary | ICD-10-CM

## 2024-04-10 DIAGNOSIS — I5021 Acute systolic (congestive) heart failure: Secondary | ICD-10-CM

## 2024-04-10 DIAGNOSIS — R600 Localized edema: Secondary | ICD-10-CM

## 2024-04-10 MED ORDER — FUROSEMIDE 20 MG PO TABS: 20.0000 mg | ORAL_TABLET | Freq: Every day | ORAL | 1 refills | Status: DC

## 2024-04-10 NOTE — Telephone Encounter (Signed)
 Pt c/o swelling/edema: STAT if pt has developed SOB within 24 hours  If swelling, where is the swelling located? ANKLES AND LEGS  How much weight have you gained and in what time span?  STATES 5 LBS A DAY IT GOES UP AND DOWN   Have you gained 2 pounds in a day or 5 pounds in a week? 2 LBS IN A DAY   Do you have a log of your daily weights (if so, list)? NO   Are you currently taking a fluid pill? YES  Are you currently SOB? NO   Have you traveled recently in a car or plane for an extended period of time? NO   Patient states that she was told to have labs but unable to do because of family issues.

## 2024-04-10 NOTE — Telephone Encounter (Signed)
 Patient informed and verbalized understanding of plan. New Rx sent to pharmacy and labs sent to [facility]   Will send to scheduling to have them call her and schedule appointment

## 2024-04-12 IMAGING — CT CT ABD-PELV W/ CM
2 of 5 series · 16 of 46 positions shown, 18 images · IV contrast (Omnipaque or Isovue)
Comparison: Ultrasound 04/30/2022

CLINICAL DATA: Abdominal pain, acute, nonlocalized.

EXAM:
CT ABDOMEN AND PELVIS WITH CONTRAST
TECHNIQUE: Multidetector CT imaging of the abdomen and pelvis was performed
using the standard protocol following bolus administration of
intravenous contrast.

[Series 2: axial st · axial · 0.88mm/px · z∈[+756,+1171]mm · 13 of 95 slices shown, 15 images]
[im 6/95  soft-tissue]
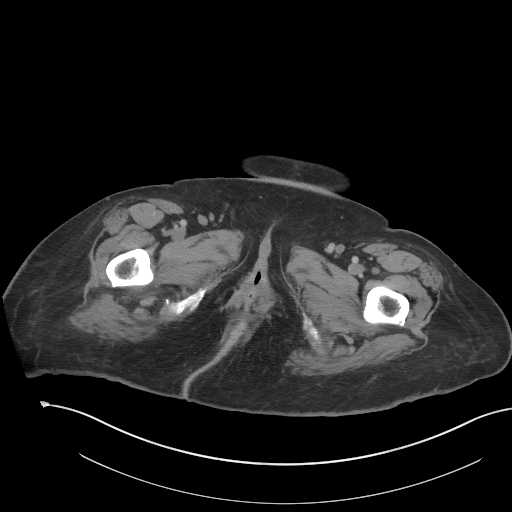
[im 6/95  bone]
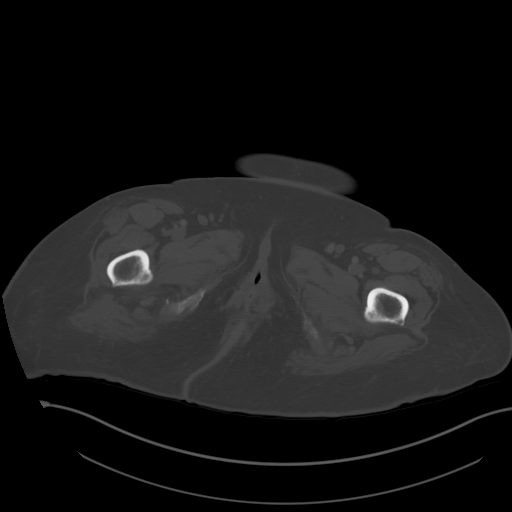
[im 12/95  soft-tissue]
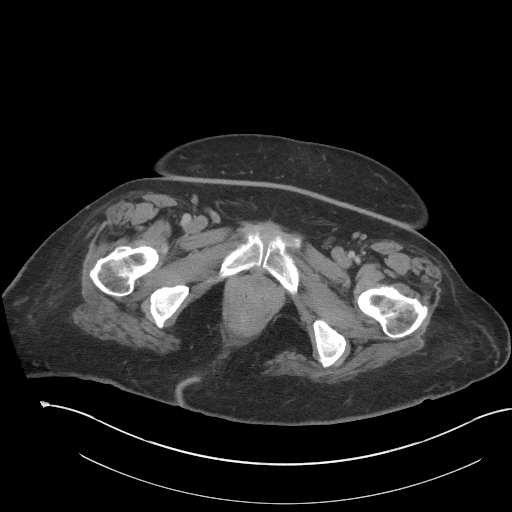
[im 18/95  soft-tissue]
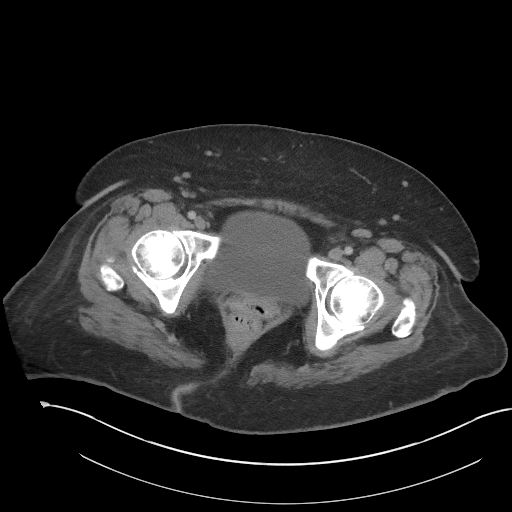
[im 30/95  soft-tissue]
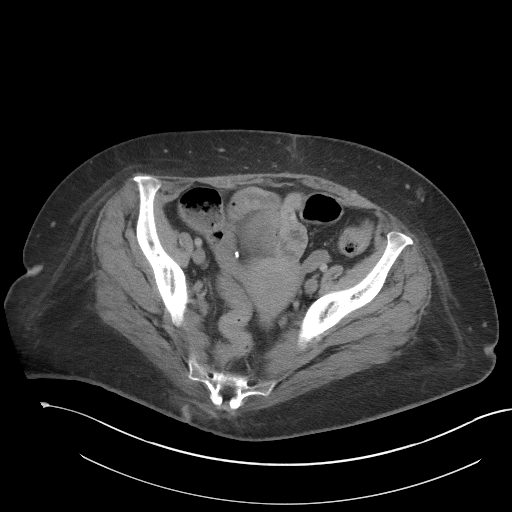
[im 36/95  soft-tissue]
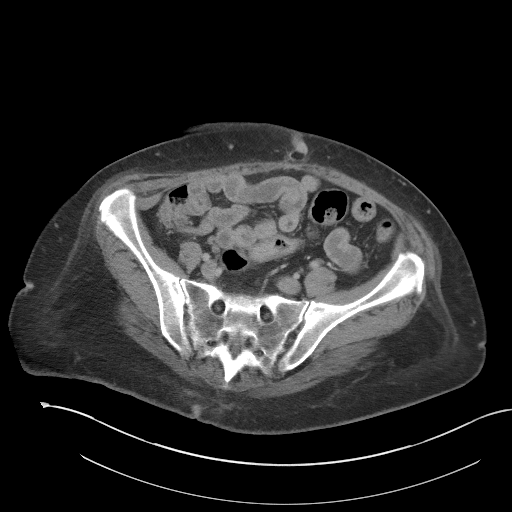
[im 42/95  soft-tissue]
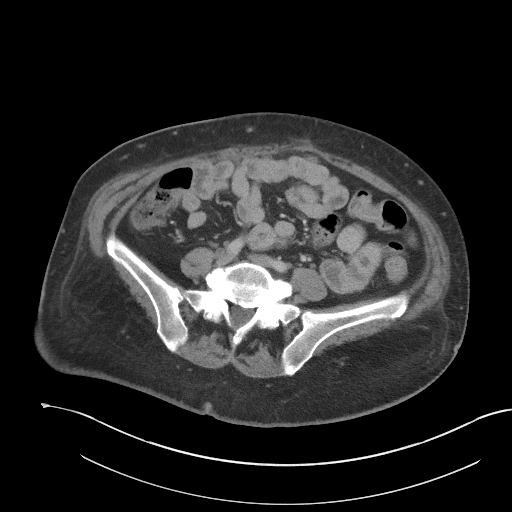
[im 48/95  soft-tissue]
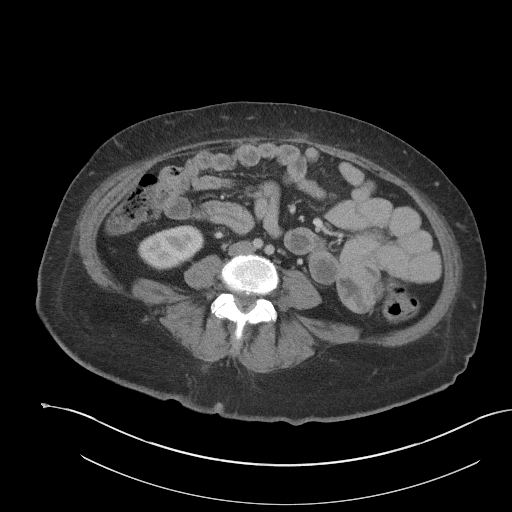
[im 53/95  soft-tissue]
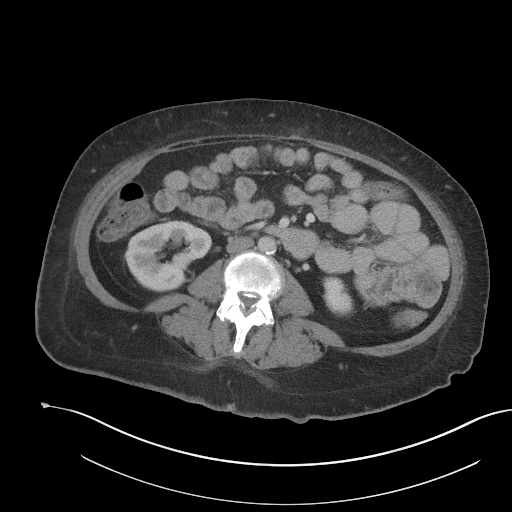
[im 59/95  soft-tissue]
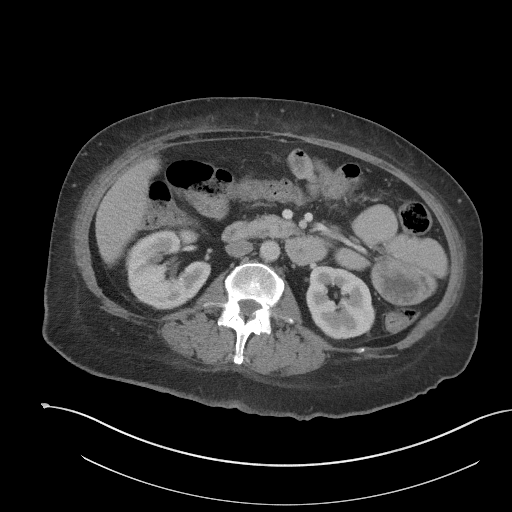
[im 59/95  bone]
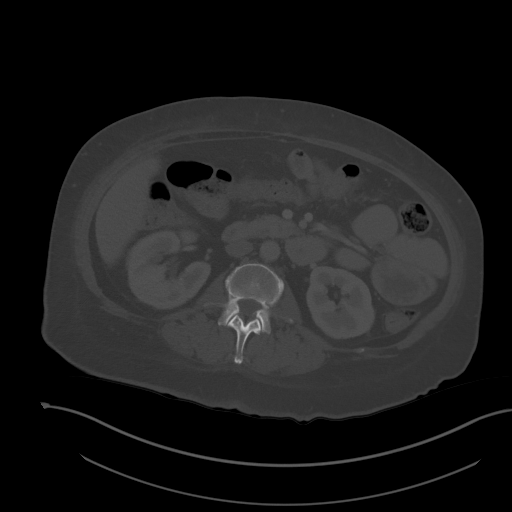
[im 65/95  soft-tissue]
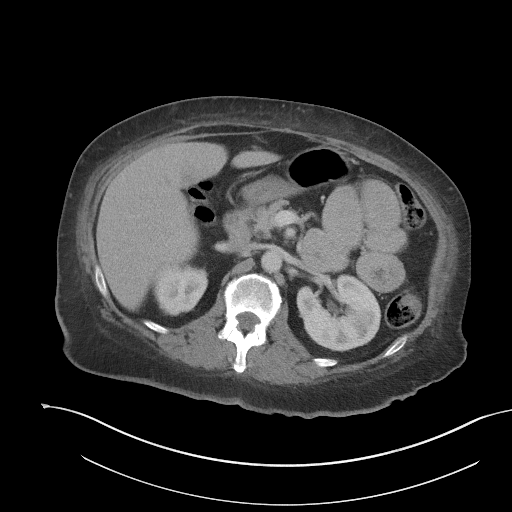
[im 77/95  soft-tissue]
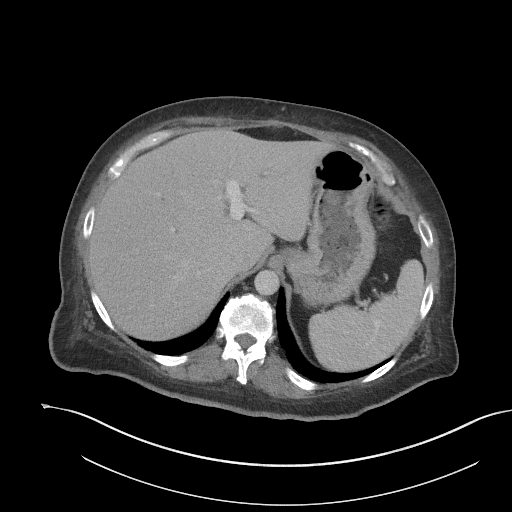
[im 83/95  soft-tissue]
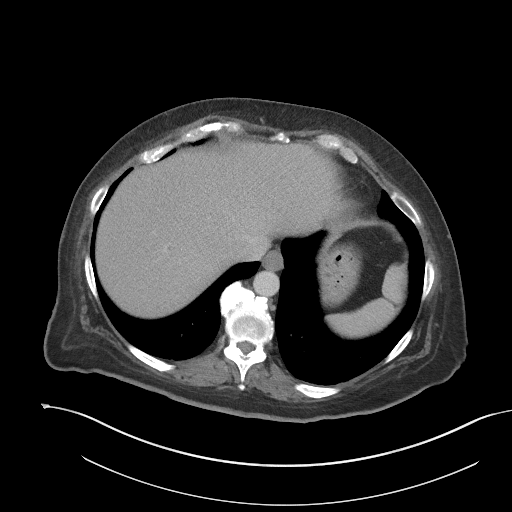
[im 89/95  soft-tissue]
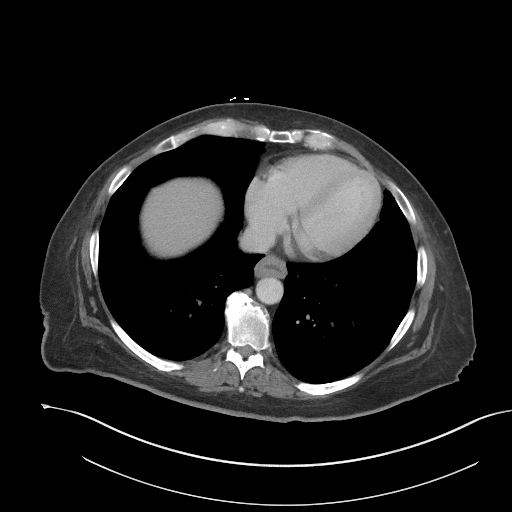

[Series 5: coronal st · coronal · 0.89mm/px · 3 of 106 slices shown]
[im 36/106  soft-tissue]
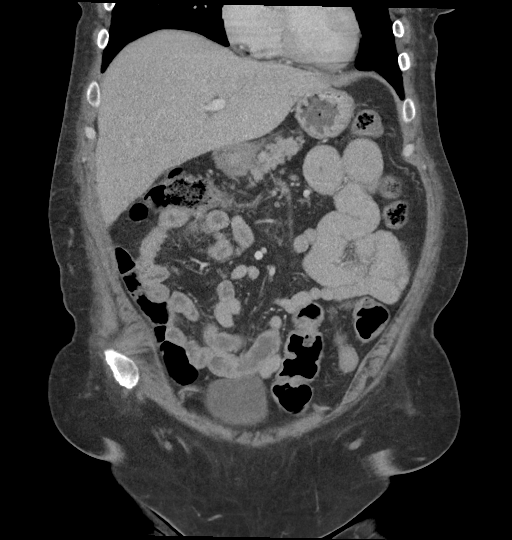
[im 47/106  soft-tissue]
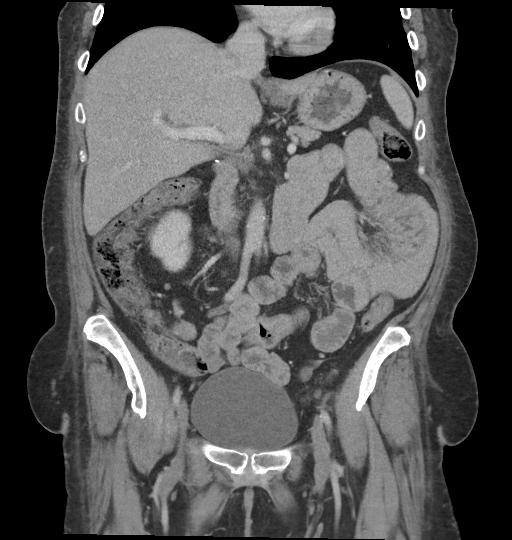
[im 59/106  soft-tissue]
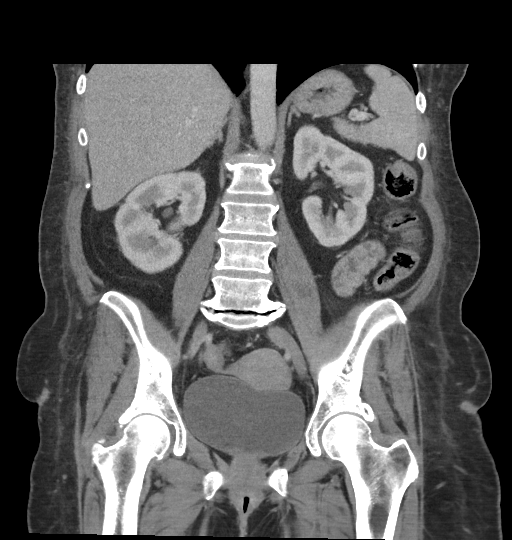

[16 of 46 positions shown; findings below may reference images not displayed]

RADIATION DOSE REDUCTION: This exam was performed according to the
departmental dose-optimization program which includes automated
exposure control, adjustment of the mA and/or kV according to
patient size and/or use of iterative reconstruction technique.

CONTRAST:  100mL OMNIPAQUE IOHEXOL 300 MG/ML  SOLN
FINDINGS: Lower chest: The lungs are now clear, except for what looks like
mild residual scarring at the left lung base.

Hepatobiliary: No liver parenchymal abnormality. Previous
cholecystectomy. Mild ductal prominence, typical following
cholecystectomy.

Pancreas: Normal

Spleen: Normal

Adrenals/Urinary Tract: Adrenal glands are normal. The kidneys are
normal except for a nonobstructing 2 mm stone in the lower pole on
the left. The bladder is normal.

Stomach/Bowel: Question distal esophageal wall thickening. The
stomach appears normal. The small bowel is normal. No abnormal colon
finding.

Vascular/Lymphatic: Mild aortic atherosclerosis. No aneurysm. IVC is
normal. No adenopathy.

Reproductive: No pelvic mass.

Other: No free fluid or air.

Musculoskeletal: Ordinary lower lumbar degenerative changes.
IMPRESSION: Lung bases now clear except for minimal scarring at the left base.

Question distal esophageal thickening suggesting esophagitis.

Previous cholecystectomy. Mild ductal prominence typical of prior
cholecystectomy.

2 mm nonobstructing stone in the lower pole the left kidney.

## 2024-04-24 LAB — BASIC METABOLIC PANEL WITH GFR
BUN/Creatinine Ratio: 23 (ref 9–23)
BUN: 14 mg/dL (ref 6–24)
CO2: 22 mmol/L (ref 20–29)
Calcium: 9.2 mg/dL (ref 8.7–10.2)
Chloride: 102 mmol/L (ref 96–106)
Creatinine, Ser: 0.61 mg/dL (ref 0.57–1.00)
Glucose: 170 mg/dL — ABNORMAL HIGH (ref 70–99)
Potassium: 4.2 mmol/L (ref 3.5–5.2)
Sodium: 141 mmol/L (ref 134–144)
eGFR: 107 mL/min/{1.73_m2} (ref >59)

## 2024-04-24 LAB — BRAIN NATRIURETIC PEPTIDE: BNP: 16.3 pg/mL (ref 0.0–100.0)

## 2024-04-24 LAB — MAGNESIUM: Magnesium: 1.5 mg/dL — ABNORMAL LOW (ref 1.6–2.3)

## 2024-04-30 NOTE — Telephone Encounter (Signed)
 Okay to increase Lasix  to 40 mg daily and repeat BMET in 2 weeks.   Best, Lasalle Pointer, NP

## 2024-05-01 ENCOUNTER — Telehealth: Payer: Self-pay

## 2024-05-01 DIAGNOSIS — R600 Localized edema: Secondary | ICD-10-CM

## 2024-05-01 DIAGNOSIS — I5032 Chronic diastolic (congestive) heart failure: Secondary | ICD-10-CM

## 2024-05-01 DIAGNOSIS — Z79899 Other long term (current) drug therapy: Secondary | ICD-10-CM

## 2024-05-01 MED ORDER — FUROSEMIDE 40 MG PO TABS: 40.0000 mg | ORAL_TABLET | Freq: Every day | ORAL | 3 refills | Status: DC

## 2024-05-01 NOTE — Telephone Encounter (Signed)
 Per Nellie Banas:  Okay to increase Lasix  to 40 mg daily and repeat BMET in 2 weeks.    Best, Lasalle Pointer, NP  Advised patient will send lab order out to her and to still keep her same appointment for 06/04.Patient verbalized understanding.

## 2024-05-01 NOTE — Telephone Encounter (Signed)
 error    This encounter was created in error - please disregard.

## 2024-05-01 NOTE — Telephone Encounter (Signed)
 Patient notified of result.  Please refer to phone note from today for complete details.   Camilo Cella, CMA 05/01/2024 9:45 AM

## 2024-05-12 ENCOUNTER — Encounter (HOSPITAL_COMMUNITY): Payer: Self-pay | Admitting: *Deleted

## 2024-05-12 ENCOUNTER — Other Ambulatory Visit: Payer: Self-pay

## 2024-05-12 ENCOUNTER — Emergency Department (HOSPITAL_COMMUNITY)

## 2024-05-12 ENCOUNTER — Emergency Department (HOSPITAL_COMMUNITY)
Admission: EM | Admit: 2024-05-12 | Discharge: 2024-05-12 | Disposition: A | Discharge status: Home / Self Care | Attending: Emergency Medicine | Admitting: Emergency Medicine

## 2024-05-12 DIAGNOSIS — R059 Cough, unspecified: Secondary | ICD-10-CM | POA: Diagnosis present

## 2024-05-12 DIAGNOSIS — J209 Acute bronchitis, unspecified: Secondary | ICD-10-CM | POA: Diagnosis not present

## 2024-05-12 DIAGNOSIS — I509 Heart failure, unspecified: Secondary | ICD-10-CM | POA: Diagnosis not present

## 2024-05-12 LAB — BASIC METABOLIC PANEL WITH GFR
Anion gap: 16 — ABNORMAL HIGH (ref 5–15)
BUN: 17 mg/dL (ref 6–20)
CO2: 25 mmol/L (ref 22–32)
Calcium: 9 mg/dL (ref 8.9–10.3)
Chloride: 95 mmol/L — ABNORMAL LOW (ref 98–111)
Creatinine, Ser: 0.71 mg/dL (ref 0.44–1.00)
GFR, Estimated: >60 mL/min (ref >60)
Glucose, Bld: 164 mg/dL — ABNORMAL HIGH (ref 70–99)
Potassium: 3.7 mmol/L (ref 3.5–5.1)
Sodium: 136 mmol/L (ref 135–145)

## 2024-05-12 LAB — CBC
HCT: 40.8 % (ref 36.0–46.0)
Hemoglobin: 13.8 g/dL (ref 12.0–15.0)
MCH: 32.8 pg (ref 26.0–34.0)
MCHC: 33.8 g/dL (ref 30.0–36.0)
MCV: 96.9 fL (ref 80.0–100.0)
Platelets: 266 10*3/uL (ref 150–400)
RBC: 4.21 MIL/uL (ref 3.87–5.11)
RDW: 13.2 % (ref 11.5–15.5)
WBC: 12.6 10*3/uL — ABNORMAL HIGH (ref 4.0–10.5)
nRBC: 0 % (ref 0.0–0.2)

## 2024-05-12 LAB — BRAIN NATRIURETIC PEPTIDE: B Natriuretic Peptide: 33 pg/mL (ref 0.0–100.0)

## 2024-05-12 MED ORDER — AMOXICILLIN-POT CLAVULANATE 875-125 MG PO TABS
1.0000 | ORAL_TABLET | Freq: Once | ORAL | Status: AC
  Administered 2024-05-12: 1 via ORAL
  Filled 2024-05-12: qty 1

## 2024-05-12 MED ORDER — AZITHROMYCIN 250 MG PO TABS: 250.0000 mg | ORAL_TABLET | Freq: Every day | ORAL | 0 refills | Status: AC

## 2024-05-12 MED ORDER — AMOXICILLIN-POT CLAVULANATE 875-125 MG PO TABS: 1.0000 | ORAL_TABLET | Freq: Two times a day (BID) | ORAL | 0 refills | Status: AC

## 2024-05-12 MED ORDER — AZITHROMYCIN 250 MG PO TABS
500.0000 mg | ORAL_TABLET | Freq: Once | ORAL | Status: AC
  Administered 2024-05-12: 500 mg via ORAL
  Filled 2024-05-12: qty 2

## 2024-05-12 NOTE — Discharge Instructions (Signed)
Please follow-up with your physician.  Return here for concerning changes in your condition. 

## 2024-05-12 NOTE — ED Provider Notes (Signed)
 [place] EMERGENCY DEPARTMENT AT [facility] Provider Note   CSN: [id number] Arrival date & time: 05/12/24  1658     History  Chief Complaint  Patient presents with   Cough    Kelli [name] is a 53 y.o. female.  HPI Patient presents with cough, can, fatigue.  She also complains of bodyaches, headache, notes a history of CHF, as well as ongoing vape usage.  No fever, no syncope, no true chest pain.    Home Medications Prior to Admission medications   Medication Sig Start Date End Date Taking? Authorizing Provider  amoxicillin -clavulanate (AUGMENTIN) 875-125 MG tablet Take 1 tablet by mouth every 12 (twelve) hours. 05/12/24  Yes Dorenda Gandy, MD  azithromycin  (ZITHROMAX ) 250 MG tablet Take 1 tablet (250 mg total) by mouth daily for 4 days. Take 1 every day until finished. 05/12/24 05/16/24 Yes Dorenda Gandy, MD  acetaminophen  (TYLENOL ) 325 MG tablet Take 650 mg by mouth as needed.    [provider]  albuterol  (VENTOLIN  HFA) 108 (90 Base) MCG/ACT inhaler Inhale 1 puff into the lungs as needed for wheezing or shortness of breath. 01/30/20   [provider]  bisoprolol  (ZEBETA ) 5 MG tablet TAKE 1/2 TABLET BY MOUTH DAILY - please schedule APPOINTMENT FOR more refills 05/31/23   Lasalle Pointer, NP  blood glucose meter kit and supplies Dispense based on patient and insurance preference. Use up to four times daily as directed. (FOR ICD-10 E10.9, E11.9). 05/04/22   Johnson, Clanford L, MD  buPROPion  (WELLBUTRIN  XL) 300 MG 24 hr tablet Take 300 mg by mouth daily. 09/08/22   [provider]  busPIRone (BUSPAR) 10 MG tablet Take 10 mg by mouth 2 (two) times daily. 09/20/22   [provider]  Cholecalciferol (VITAMIN D3) 25 MCG (1000 UT) CAPS Take 1 capsule by mouth daily.    [provider]  cyanocobalamin (VITAMIN B12) 1000 MCG tablet Take 1,000 mcg by mouth daily.    [provider]  Elastic Bandages & Supports (MEDICAL  COMPRESSION STOCKINGS) MISC 1 each by Does not apply route Once PRN for up to 1 dose. 06/13/23   Lasalle Pointer, NP  escitalopram  (LEXAPRO ) 20 MG tablet Take 20 mg by mouth daily. 03/23/22   [provider]  furosemide  (LASIX ) 40 MG tablet Take 1 tablet (40 mg total) by mouth daily. 05/01/24   Lasalle Pointer, NP  HYDROcodone -acetaminophen  (NORCO) 7.5-325 MG tablet Take 1 tablet by mouth 2 (two) times daily. 01/15/23   [provider]  Insulin  Pen Needle 31G X 5 MM MISC 1 Device by Does not apply route as directed. 05/04/22   Johnson, Clanford L, MD  magnesium  oxide (MAG-OX) 400 (240 Mg) MG tablet Take 400 mg by mouth daily.    [provider]  metFORMIN  (GLUCOPHAGE -XR) 500 MG 24 hr tablet Take 2 tablets (1,000 mg total) by mouth 2 (two) times daily with a meal. 05/04/22   Johnson, Clanford L, MD  ondansetron  (ZOFRAN -ODT) 4 MG disintegrating tablet Take 4 mg by mouth every 8 (eight) hours as needed. 09/20/22   [provider]  pantoprazole  (PROTONIX ) 40 MG tablet TAKE 1 TABLET BY MOUTH DAILY 10/30/22   Strader, Brittany M, PA-C  polyethylene glycol (MIRALAX  / GLYCOLAX ) 17 g packet Take 17 g by mouth daily as needed for mild constipation. 05/04/22   Rayfield Cairo, MD  potassium chloride  (KLOR-CON  M) 10 MEQ tablet Daily as needed 05/31/23   Lasalle Pointer, NP  pregabalin (LYRICA) 75 MG capsule Take  75 mg by mouth 3 (three) times daily. 03/05/23   [provider]  rosuvastatin  (CRESTOR ) 10 MG tablet Take 1 tablet (10 mg total) by mouth daily. 05/31/23 01/21/24  Lasalle Pointer, NP  sitaGLIPtin (JANUVIA) 100 MG tablet Take 100 mg by mouth daily.    [provider]  spironolactone  (ALDACTONE ) 25 MG tablet Take 0.5 tablets (12.5 mg total) by mouth daily. 05/31/23   Lasalle Pointer, NP  SUMAtriptan  (IMITREX ) 100 MG tablet Take 100 mg by mouth daily as needed for migraine or headache. 08/04/22   [provider]  traZODone (DESYREL) 50 MG tablet Take 50  mg by mouth at bedtime. 09/08/22   [provider]      Allergies    Aspirin and Neosporin [neomycin-bacitracin zn-polymyx]    Review of Systems   Review of Systems  Physical Exam Updated Vital Signs BP 139/69 (BP Location: Right Arm)   Pulse (!) 111   Temp 98.3 F (36.8 C) (Oral)   Resp 16   Ht 5\' 4"  (1.626 m)   Wt 100.7 kg   LMP 11/25/2019   SpO2 94%   BMI 38.11 kg/m  Physical Exam Vitals and nursing note reviewed.  Constitutional:      General: She is not in acute distress.    Appearance: She is well-developed.  HENT:     Head: Normocephalic and atraumatic.  Eyes:     Conjunctiva/sclera: Conjunctivae normal.  Cardiovascular:     Rate and Rhythm: Normal rate and regular rhythm.  Pulmonary:     Effort: Pulmonary effort is normal. No respiratory distress.     Breath sounds: Decreased air movement present. No stridor.  Abdominal:     General: There is no distension.  Skin:    General: Skin is warm and dry.  Neurological:     Mental Status: She is alert and oriented to person, place, and time.     Cranial Nerves: No cranial nerve deficit.  Psychiatric:        Mood and Affect: Mood normal.     ED Results / Procedures / Treatments   Labs (all labs ordered are listed, but only abnormal results are displayed) Labs Reviewed  BASIC METABOLIC PANEL WITH GFR - Abnormal; Notable for the following components:      Result Value   Chloride 95 (*)    Glucose, Bld 164 (*)    Anion gap 16 (*)    All other components within normal limits  CBC - Abnormal; Notable for the following components:   WBC 12.6 (*)    All other components within normal limits  BRAIN NATRIURETIC PEPTIDE    EKG EKG Interpretation Date/Time:  Monday May 12 2024 19:48:07 EDT Ventricular Rate:  89 PR Interval:  134 QRS Duration:  92 QT Interval:  364 QTC Calculation: 442 R Axis:   48  Text Interpretation: Normal sinus rhythm Low voltage QRS Borderline ECG Confirmed by Dorenda Gandy [phone]) on 05/12/2024 8:22:13 PM  Radiology DG Chest 2 View Result Date: 05/12/2024 CLINICAL DATA:  sob EXAM: CHEST - 2 VIEW COMPARISON:  January 02, 2024 FINDINGS: Streaky atelectasis in the lateral right costophrenic sulcus. No focal airspace consolidation, pleural effusion, or pneumothorax. No cardiomegaly. No acute fracture or destructive lesion. Multilevel thoracic osteophytosis. Bilateral AC joint osteoarthritis. IMPRESSION: No acute cardiopulmonary abnormality. Electronically Signed   By: Rance Burrows M.D.   On: 05/12/2024 18:23    Procedures Procedures    Medications Ordered in ED Medications  amoxicillin -clavulanate (AUGMENTIN) 875-125  MG per tablet 1 tablet (has no administration in time range)  azithromycin  (ZITHROMAX ) tablet 500 mg (has no administration in time range)    ED Course/ Medical Decision Making/ A&P                                 Medical Decision Making Adult weight, history of prior pneumonia presents with signs and symptoms of respiratory illness.  Patient describes symptoms as being the same as prior pneumonia, and here she has diminished breath sounds, slight tachycardia no fever, no hypotension, no evidence for bacteremia or sepsis.  Patient leukocytosis as well, concern for clinical pneumonia versus bronchitis.  Patient has mild hyperglycemia, no substantial gap acidosis, no distress, and notes that she will monitor her glucose as an outpatient after initiation of antibiotics here.  Patient will follow-up with her primary care physician.  Amount and/or Complexity of Data Reviewed Labs:  Decision-making details documented in ED Course. Radiology: independent interpretation performed. Decision-making details documented in ED Course.  Risk Prescription drug management.   Repeat exam patient in no distress aware of all findings, we discussed importance of following up with primary care and today's additional lab results beyond x-ray, notable for mild  hyperglycemia.   Final Clinical Impression(s) / ED Diagnoses Final diagnoses:  Acute bronchitis, unspecified organism    Rx / DC Orders ED Discharge Orders          Ordered    amoxicillin -clavulanate (AUGMENTIN) 875-125 MG tablet  Every 12 hours        05/12/24 2047    azithromycin  (ZITHROMAX ) 250 MG tablet  Daily        05/12/24 2047              Dorenda Gandy, MD 05/12/24 2047

## 2024-05-12 NOTE — ED Triage Notes (Signed)
 Pt c/o dry cough, occ productive x 4-5 days.  Pt with hx of CHF. + fatigue + body aches + HAs

## 2024-05-14 ENCOUNTER — Ambulatory Visit: Attending: Nurse Practitioner | Admitting: Nurse Practitioner

## 2024-05-14 ENCOUNTER — Encounter: Payer: Self-pay | Admitting: Nurse Practitioner

## 2024-05-14 VITALS — BP 136/70 | HR 71 | Ht 64.0 in | Wt 226.6 lb

## 2024-05-14 DIAGNOSIS — R600 Localized edema: Secondary | ICD-10-CM | POA: Insufficient documentation

## 2024-05-14 DIAGNOSIS — I5032 Chronic diastolic (congestive) heart failure: Secondary | ICD-10-CM | POA: Insufficient documentation

## 2024-05-14 DIAGNOSIS — E785 Hyperlipidemia, unspecified: Secondary | ICD-10-CM | POA: Insufficient documentation

## 2024-05-14 DIAGNOSIS — I872 Venous insufficiency (chronic) (peripheral): Secondary | ICD-10-CM | POA: Insufficient documentation

## 2024-05-14 DIAGNOSIS — R1310 Dysphagia, unspecified: Secondary | ICD-10-CM | POA: Diagnosis present

## 2024-05-14 DIAGNOSIS — I428 Other cardiomyopathies: Secondary | ICD-10-CM | POA: Insufficient documentation

## 2024-05-14 NOTE — Patient Instructions (Signed)

## 2024-05-14 NOTE — Progress Notes (Signed)
 Office Visit    Patient Name: Kelli [name] Date of Encounter: 05/14/2024 PCP:  Joenathan Muslim, FNP [facility]  Cardiologist:  Jann Melody, MD  Advanced Practice Provider:  No care team member to display Electrophysiologist:  None   Chief Complaint and HPI    Shakeita Vandevander is a 53 y.o. female with a hx of hyperlipidemia, HFrecEF/NICM, type 2 diabetes, anemia, GAD, migraines, obesity, depression, and diverticulosis, who presents today for scheduled follow-up.  Admitted May 2023 at [facility] for severe sepsis, found to have UTI and pneumonia.  Echo revealed EF reduced at 40 to 45%, cardiology consulted.  Was started on Aldactone , Lasix , bisoprolol , BP did not allow for ACE inhibitor/ARB, was not on SGLT2 inhibitor given UTI.  Presented back to ED in June 2023 for abdominal pain and low back pain.  CT of abdomen revealed nonobstructive kidney stone, nothing acute.  Was found to have UTI and discharged on ABX.  I last saw her around 1 year ago for follow-up. Was doing well and feeling great at the time.   Today she presents for follow-up. Recently diagnosed with bronchitis and has not been feeling great. Says she is looking into getting her esophagus examined as she notes difficulty swallowing. Denies any chest pain, shortness of breath, palpitations, syncope, presyncope, dizziness, orthopnea, PND, swelling or significant weight changes, acute bleeding, or claudication.  SH: Works at [employer] in [place], [place]   EKGs/Labs/Other Studies Reviewed:   The following studies were reviewed today:   EKG:  EKG is not ordered today.    Limited Echo 06/2022:   1. Limited study.   2. Left ventricular ejection fraction, by estimation, is 55 to 60%. The  left ventricle has normal function. The left ventricle has no regional  wall motion abnormalities. Left ventricular diastolic parameters were  normal. The average left ventricular  global  longitudinal strain is -21.9 %. The global longitudinal strain is  normal.   3. Right ventricular systolic function is normal. The right ventricular  size is normal.   4. The aortic valve is tricuspid.   Comparison(s): Prior images reviewed side by side. LVEF has improved to  normal range, 55-60%.  Risk Assessment/Calculations:   The 10-year ASCVD risk score (Arnett DK, et al., 2019) is: 2.6%   Values used to calculate the score:     Age: 40 years     Sex: Female     Is Non-Hispanic African American: No     Diabetic: Yes     Tobacco smoker: No     Systolic Blood Pressure: 136 mmHg     Is BP treated: Yes     HDL Cholesterol: 95 mg/dL     Total Cholesterol: 198 mg/dL   Review of Systems    All other systems reviewed and are otherwise negative except as noted above.  Physical Exam    VS:  BP 136/70 (BP Location: Right Arm, Patient Position: Sitting)   Pulse 71   Ht 5\' 4"  (1.626 m)   Wt 226 lb 9.6 oz (102.8 kg)   LMP 11/25/2019   SpO2 96%   BMI 38.90 kg/m  , BMI Body mass index is 38.9 kg/m.  Wt Readings from Last 3 Encounters:  05/14/24 226 lb 9.6 oz (102.8 kg)  05/12/24 222 lb (100.7 kg)  01/22/24 211 lb 3.2 oz (95.8 kg)     GEN: Well nourished, well developed, in no acute distress. HEENT: normal. Neck: Supple, no JVD,  carotid bruits, or masses. Cardiac: S1/S2, RRR, no murmurs, rubs, or gallops. No clubbing, cyanosis, edema.  Radials/PT 2+ and equal bilaterally.  Respiratory:  Respirations regular and unlabored, clear to auscultation bilaterally. GI: Soft, nontender, nondistended. MS: No deformity or atrophy. Skin: Warm and dry, no rash. Neuro:  Strength and sensation are intact. Psych: Normal affect.  Assessment & Plan    HFrecEF, NICM Stage C, NYHA class I. Limited Echo 06/2022 revealed EF 55-60%. Euvolemic and well compensated on exam. Continue current medication regimen. Low sodium diet, fluid restriction <2L, and daily weights encouraged. Educated to  contact our office for weight gain of 2 lbs overnight or 5 lbs in one week.  HLD LDL 81 02/2024. Continue current medication regimen. Heart healthy diet and regular cardiovascular exercise encouraged.   3. Chronic venous insufficiency, leg edema Stable, improved. Heart healthy diet and regular cardiovascular exercise encouraged. Continue current medication regimen.   4. Dysphagia Previously seen by Dr. Riley Cheadle in the past. Recommended to f/u with PCP for further evaluation.    Disposition: Care and ED precautions discussed. Follow up in 6 months with Jann Melody, MD or APP.  Signed, Lasalle Pointer, NP

## 2024-05-18 ENCOUNTER — Other Ambulatory Visit: Payer: Self-pay | Admitting: Student

## 2024-07-01 ENCOUNTER — Other Ambulatory Visit: Payer: Self-pay

## 2024-07-01 MED ORDER — FUROSEMIDE 40 MG PO TABS: 40.0000 mg | ORAL_TABLET | Freq: Every day | ORAL | 3 refills | Status: AC
